# Patient Record
Sex: Female | Born: 1970 | Race: White | Hispanic: No | Marital: Married | Smoking: Current every day smoker
Health system: Southern US, Community
[De-identification: ages and names within clinical notes are randomized; demographics above are authoritative.]

## PROBLEM LIST (undated history)

## (undated) DIAGNOSIS — F32A Depression, unspecified: Secondary | ICD-10-CM

## (undated) DIAGNOSIS — F329 Major depressive disorder, single episode, unspecified: Secondary | ICD-10-CM

## (undated) DIAGNOSIS — Z915 Personal history of self-harm: Secondary | ICD-10-CM

## (undated) DIAGNOSIS — J449 Chronic obstructive pulmonary disease, unspecified: Secondary | ICD-10-CM

## (undated) DIAGNOSIS — J342 Deviated nasal septum: Secondary | ICD-10-CM

## (undated) DIAGNOSIS — E785 Hyperlipidemia, unspecified: Secondary | ICD-10-CM

## (undated) DIAGNOSIS — R51 Headache: Secondary | ICD-10-CM

## (undated) DIAGNOSIS — F419 Anxiety disorder, unspecified: Secondary | ICD-10-CM

## (undated) DIAGNOSIS — I499 Cardiac arrhythmia, unspecified: Secondary | ICD-10-CM

## (undated) DIAGNOSIS — Z720 Tobacco use: Secondary | ICD-10-CM

## (undated) DIAGNOSIS — Z8669 Personal history of other diseases of the nervous system and sense organs: Secondary | ICD-10-CM

## (undated) HISTORY — PX: SHOULDER SURGERY: SHX246

## (undated) HISTORY — PX: BREAST ENHANCEMENT SURGERY: SHX7

## (undated) HISTORY — DX: Headache: R51

## (undated) HISTORY — DX: Personal history of self-harm: Z91.5

## (undated) HISTORY — PX: ABDOMINAL HYSTERECTOMY: SHX81

## (undated) HISTORY — PX: AUGMENTATION MAMMAPLASTY: SUR837

## (undated) HISTORY — DX: Anxiety disorder, unspecified: F41.9

## (undated) HISTORY — DX: Major depressive disorder, single episode, unspecified: F32.9

## (undated) HISTORY — DX: Chronic obstructive pulmonary disease, unspecified: J44.9

## (undated) HISTORY — DX: Tobacco use: Z72.0

## (undated) HISTORY — DX: Personal history of other diseases of the nervous system and sense organs: Z86.69

## (undated) HISTORY — DX: Depression, unspecified: F32.A

## (undated) HISTORY — DX: Deviated nasal septum: J34.2

## (undated) HISTORY — PX: TRANSUMBILICAL AUGMENTATION MAMMAPLASTY: SUR838

## (undated) HISTORY — DX: Cardiac arrhythmia, unspecified: I49.9

---

## 2008-04-16 DIAGNOSIS — Z9151 Personal history of suicidal behavior: Secondary | ICD-10-CM

## 2008-04-16 HISTORY — DX: Personal history of suicidal behavior: Z91.51

## 2016-11-14 ENCOUNTER — Ambulatory Visit
Admission: RE | Admit: 2016-11-14 | Discharge: 2016-11-14 | Disposition: A | Discharge status: Home / Self Care | Payer: Commercial Managed Care - PPO | Source: Ambulatory Visit | Attending: Orthopedic Surgery | Admitting: Orthopedic Surgery

## 2016-11-14 ENCOUNTER — Other Ambulatory Visit: Payer: Self-pay | Admitting: Orthopedic Surgery

## 2016-11-14 DIAGNOSIS — M79671 Pain in right foot: Secondary | ICD-10-CM

## 2017-05-01 ENCOUNTER — Other Ambulatory Visit: Payer: Self-pay | Admitting: Family Medicine

## 2017-05-01 ENCOUNTER — Ambulatory Visit
Admission: RE | Admit: 2017-05-01 | Discharge: 2017-05-01 | Disposition: A | Discharge status: Home / Self Care | Payer: PRIVATE HEALTH INSURANCE | Source: Ambulatory Visit | Attending: Family Medicine | Admitting: Family Medicine

## 2017-05-01 DIAGNOSIS — J40 Bronchitis, not specified as acute or chronic: Secondary | ICD-10-CM

## 2017-05-16 ENCOUNTER — Other Ambulatory Visit: Payer: Self-pay

## 2017-05-16 ENCOUNTER — Emergency Department
Admission: EM | Admit: 2017-05-16 | Discharge: 2017-05-16 | Disposition: A | Discharge status: Home / Self Care | Payer: PRIVATE HEALTH INSURANCE | Attending: Emergency Medicine | Admitting: Emergency Medicine

## 2017-05-16 ENCOUNTER — Emergency Department: Payer: PRIVATE HEALTH INSURANCE

## 2017-05-16 DIAGNOSIS — F419 Anxiety disorder, unspecified: Secondary | ICD-10-CM | POA: Insufficient documentation

## 2017-05-16 DIAGNOSIS — F17210 Nicotine dependence, cigarettes, uncomplicated: Secondary | ICD-10-CM | POA: Insufficient documentation

## 2017-05-16 DIAGNOSIS — R079 Chest pain, unspecified: Secondary | ICD-10-CM | POA: Insufficient documentation

## 2017-05-16 HISTORY — DX: Hyperlipidemia, unspecified: E78.5

## 2017-05-16 LAB — BASIC METABOLIC PANEL
Anion gap: 10 (ref 5–15)
BUN: 12 mg/dL (ref 6–20)
CHLORIDE: 105 mmol/L (ref 101–111)
CO2: 26 mmol/L (ref 22–32)
CREATININE: 0.84 mg/dL (ref 0.44–1.00)
Calcium: 10.7 mg/dL — ABNORMAL HIGH (ref 8.9–10.3)
Glucose, Bld: 102 mg/dL — ABNORMAL HIGH (ref 65–99)
Potassium: 3.7 mmol/L (ref 3.5–5.1)
SODIUM: 141 mmol/L (ref 135–145)

## 2017-05-16 LAB — CBC
HCT: 43.6 % (ref 35.0–47.0)
Hemoglobin: 14.6 g/dL (ref 12.0–16.0)
MCH: 30.9 pg (ref 26.0–34.0)
MCHC: 33.4 g/dL (ref 32.0–36.0)
MCV: 92.5 fL (ref 80.0–100.0)
PLATELETS: 233 10*3/uL (ref 150–440)
RBC: 4.72 MIL/uL (ref 3.80–5.20)
RDW: 13.3 % (ref 11.5–14.5)
WBC: 11.5 10*3/uL — AB (ref 3.6–11.0)

## 2017-05-16 LAB — TROPONIN I

## 2017-05-16 MED ORDER — ASPIRIN 81 MG PO CHEW
324.0000 mg | CHEWABLE_TABLET | Freq: Once | ORAL | Status: AC
  Administered 2017-05-16: 324 mg via ORAL
  Filled 2017-05-16: qty 4

## 2017-05-16 MED ORDER — LORAZEPAM 1 MG PO TABS
1.0000 mg | ORAL_TABLET | Freq: Once | ORAL | Status: AC
  Administered 2017-05-16: 1 mg via ORAL
  Filled 2017-05-16: qty 1

## 2017-05-16 NOTE — ED Notes (Signed)
E-signature pad not functioning at time of pt d/c; paper copy signed by pt for scanning into EMR.

## 2017-05-16 NOTE — Discharge Instructions (Signed)
Please return to the emergency department if you develop severe pain, shortness of breath, lightheadedness or fainting, fever, or any other symptoms concerning to you. °

## 2017-05-16 NOTE — ED Provider Notes (Addendum)
[facility] Emergency Department Provider Note  ____________________________________________  Time seen: Approximately 8:16 PM  I have reviewed the triage vital signs and the nursing notes.   HISTORY  Chief Complaint Chest Pain    HPI Nancy Porter is a 47 y.o. female with ongoing tobacco abuse presenting for chest pain.  The patient reports that she was at work as a IT consultantparalegal, sitting down, when she developed the sensation of chest pain described as "someone squeezing both sides of my chest.  The pain radiated into her right shoulder neck and jaw; she did not have a pleuritic component.  She did not have any associated diaphoresis, nausea or vomiting, palpitations, lightheadedness or syncope, shortness of breath.  At home, she tried Tums, but her pain persisted.  The patient also reports that she has been under "a lot of stress with lots of anxiety," and that her PCP started her on Wellbutrin 4 days ago.  In addition, she "just got over a bout of bronchitis."  She has never been on medications for hypertension, hyperlipidemia, diabetes; she denies the use of cocaine.  She has no personal or family history of blood clots.  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 Hyperlipidemia     There are no active problems to display for this patient.   Past Surgical History:  Procedure Laterality Date  . BREAST ENHANCEMENT SURGERY    . CESAREAN SECTION        Allergies Patient has no known allergies.  No family history on file.  Social History Social History   Tobacco Use  . Smoking status: Current Every Day Smoker    Packs/day: 1.00    Types: Cigarettes  . Smokeless tobacco: Never Used  Substance Use Topics  . Alcohol use: Yes    Comment: occ  . Drug use: No    Review of Systems Constitutional: No  fever/chills.  No lightheadedness or syncope. Eyes: No visual changes. ENT: No sore throat. No congestion or rhinorrhea. Cardiovascular: Positive chest pain. Denies palpitations. Respiratory: Denies shortness of breath.  No cough.  Recent bronchitis, now resolved. Gastrointestinal: No abdominal pain.  No nausea, no vomiting.  No diarrhea.  No constipation. Genitourinary: Negative for dysuria. Musculoskeletal: Negative for back pain.  No lower extremity swelling or calf pain. Skin: Negative for rash. Neurological: Negative for headaches. No focal numbness, tingling or weakness.  Psychiatric:Positive anxiety.  Recently started on Wellbutrin.  ____________________________________________   PHYSICAL EXAM:  VITAL SIGNS: ED Triage Vitals  Enc Vitals Group     BP 05/16/17 1945 123/62     Pulse Rate 05/16/17 1945 77     Resp 05/16/17 1945 16     Temp 05/16/17 1945 98.3 F (36.8 C)     Temp Source 05/16/17 1945 Oral     SpO2 05/16/17 1945 98 %     Weight 05/16/17 1942 170 lb (77.1 kg)     Height 05/16/17 1942 5\' 7"  (1.702 m)     Head Circumference --      Peak Flow --      Pain Score 05/16/17 1942 8     Pain Loc --      Pain Edu? --      Excl. in GC? --     Constitutional: Alert and oriented. Well appearing and in no acute distress. Answers questions appropriately. Eyes: Conjunctivae  are normal.  EOMI. No scleral icterus. Head: Atraumatic. Nose: No congestion/rhinnorhea. Mouth/Throat: Mucous membranes are moist.  Neck: No stridor.  Supple.  JVD.  No meningismus. Cardiovascular: Normal rate, regular rhythm. No murmurs, rubs or gallops.  Respiratory: Normal respiratory effort.  No accessory muscle use or retractions. Lungs CTAB.  No wheezes, rales or ronchi. Gastrointestinal: Soft, nontender and nondistended.  No guarding or rebound.  No peritoneal signs. Musculoskeletal: No LE edema. No ttp in the calves or palpable cords.  Negative Homan's sign. Neurologic:  A&Ox3.  Speech is  clear.  Face and smile are symmetric.  EOMI.  Moves all extremities well. Skin:  Skin is warm, dry and intact. No rash noted. Psychiatric: Mood and affect are normal. Speech and behavior are normal.  Normal judgement.  ____________________________________________   LABS (all labs ordered are listed, but only abnormal results are displayed)  Labs Reviewed  BASIC METABOLIC PANEL - Abnormal; Notable for the following components:      Result Value   Glucose, Bld 102 (*)    Calcium 10.7 (*)    All other components within normal limits  CBC - Abnormal; Notable for the following components:   WBC 11.5 (*)    All other components within normal limits  TROPONIN I  TROPONIN I  POC URINE PREG, ED   ____________________________________________  EKG  ED ECG REPORT I, Rockne Menghini, the attending physician, personally viewed and interpreted this ECG.   Date: 05/16/2017  EKG Time: 1942  Rate: 72  Rhythm: normal sinus rhythm  Axis: normal  Intervals:none  ST&T Change: No STEMI  ____________________________________________  RADIOLOGY  Dg Chest 2 View  Result Date: 05/16/2017 CLINICAL DATA:  Chest pain. EXAM: CHEST  2 VIEW COMPARISON:  May 01, 2017 FINDINGS: The heart size and mediastinal contours are within normal limits. Both lungs are clear. The visualized skeletal structures are unremarkable. IMPRESSION: No active cardiopulmonary disease. Electronically Signed   By: Gerome Sam III M.D   On: 05/16/2017 20:13    ____________________________________________   PROCEDURES  Procedure(s) performed: None  Procedures  Critical Care performed: No ____________________________________________   INITIAL IMPRESSION / ASSESSMENT AND PLAN / ED COURSE  Pertinent labs & imaging results that were available during my care of the patient were reviewed by me and considered in my medical decision making (see chart for details).  47 y.o. female who is only personal cardiac  risk factors ongoing tobacco abuse, presenting with bilateral chest pain.  Overall, the patient is hemodynamically stable.  Her EKG does not show any ischemic changes and her first troponin is negative.  It is unlikely that the patient has unstable angina or MI today, but I will treat her with aspirin as we are awaiting her second troponin.  Given her smoking history and age, it is reasonable for the patient to follow-up as an outpatient with a cardiologist for outpatient stress test/risk stratification study.  It is possible that the patient's discomfort is due to anxiety, so I have ordered Ativan.  A GI cause such as reflux or peptic ulcer disease is also possible although the patient did not respond to Tums.  Plan reevaluation for final disposition.   ----------------------------------------- 10:25 PM on 05/16/2017 -----------------------------------------  I have reviewed the patient's medical chart.  The patient continues to be hemodynamically stable and asymptomatic.  Her second troponin is negative and she is feeling much better at this time.  I will plan to discharge her home with PMD as well as cardiology follow-up.  She understands return precautions as well as follow-up instructions. ____________________________________________  FINAL CLINICAL IMPRESSION(S) / ED DIAGNOSES  Final diagnoses:  Chest pain, unspecified type  Anxiety         NEW MEDICATIONS STARTED DURING THIS VISIT:  New Prescriptions   No medications on file      Rockne Menghini, MD 05/16/17 2022    Rockne Menghini, MD 05/16/17 2226

## 2017-05-16 NOTE — ED Triage Notes (Signed)
Pt c/o chest pain that started this am - this afternoon the pain persisted and she feels like someone is clenching her heart - the pain radiates into right shoulder/neck/jaw - pt denies N/V - denies dizziness - reports headache

## 2017-11-18 ENCOUNTER — Ambulatory Visit (INDEPENDENT_AMBULATORY_CARE_PROVIDER_SITE_OTHER): Payer: PRIVATE HEALTH INSURANCE | Admitting: Neurology

## 2017-11-18 ENCOUNTER — Encounter: Payer: Self-pay | Admitting: Neurology

## 2017-11-18 VITALS — BP 114/72 | HR 68 | Ht 67.0 in | Wt 166.0 lb

## 2017-11-18 DIAGNOSIS — G43019 Migraine without aura, intractable, without status migrainosus: Secondary | ICD-10-CM | POA: Diagnosis not present

## 2017-11-18 DIAGNOSIS — F17200 Nicotine dependence, unspecified, uncomplicated: Secondary | ICD-10-CM | POA: Diagnosis not present

## 2017-11-18 MED ORDER — PREDNISONE 10 MG PO TABS: ORAL_TABLET | ORAL | 0 refills | Status: DC

## 2017-11-18 MED ORDER — TOPIRAMATE 50 MG PO TABS: 50.0000 mg | ORAL_TABLET | Freq: Every day | ORAL | 2 refills | Status: DC

## 2017-11-18 MED ORDER — KETOROLAC TROMETHAMINE 60 MG/2ML IM SOLN
60.0000 mg | Freq: Once | INTRAMUSCULAR | Status: AC
  Administered 2017-11-18: 60 mg via INTRAMUSCULAR

## 2017-11-18 MED ORDER — METOCLOPRAMIDE HCL 5 MG/ML IJ SOLN
10.0000 mg | Freq: Once | INTRAVENOUS | Status: AC
  Administered 2017-11-18: 10 mg via INTRAMUSCULAR

## 2017-11-18 MED ORDER — DIPHENHYDRAMINE HCL 50 MG/ML IJ SOLN
25.0000 mg | Freq: Once | INTRAMUSCULAR | Status: AC
Start: 2017-11-18 — End: 2017-11-18
  Administered 2017-11-18: 25 mg via INTRAMUSCULAR

## 2017-11-18 NOTE — Addendum Note (Signed)
Addended by: Dorthy CoolerBURNS, SANDRA J on: 11/18/2017 11:31 AM   Modules accepted: Orders

## 2017-11-18 NOTE — Patient Instructions (Signed)
Migraine Recommendations: 1.  Start topiramate 50mg  at bedtime.  Call in 4 weeks with update and we can adjust dose if needed. 2.  Take sumatriptan 100mg  at earliest onset of headache.  May repeat dose once in 2 hours if needed.  Do not exceed two tablets in 24 hours. 3.  STOP FIORICET, EXCEDRIN, TYLENOL and IBUPROFEN.  Limit use of pain relievers to no more than 2 days out of the week.  These medications include acetaminophen, ibuprofen, triptans and narcotics.  This will help reduce risk of rebound headaches. 4.  Be aware of common food triggers such as processed sweets, processed foods with nitrites (such as deli meat, hot dogs, sausages), foods with MSG, alcohol (such as wine), chocolate, certain cheeses, certain fruits (dried fruits, bananas, some citrus fruit), vinegar, diet soda. 4.  Avoid caffeine 5.  Routine exercise 6.  Proper sleep hygiene 7.  Stay adequately hydrated with water 8.  Keep a headache diary. 9.  Maintain proper stress management. 10.  Do not skip meals. 11.  Consider supplements:  Magnesium citrate 400mg  to 600mg  daily, riboflavin 400mg , Coenzyme Q 10 100mg  three times daily 12.  We will give you a headache cocktail here in the office.  If headache persists tomorrow, fill the prednisone taper:  Take 6tabs x1day, then 5tabs x1day, then 4tabs x1day, then 3tabs x1day, then 2tabs x1day, then 1tab x1day, then STOP 13.  Follow up in 4 months but we can increase topiramate in 4 weeks if needed (contact me)

## 2017-11-18 NOTE — Progress Notes (Signed)
NEUROLOGY CONSULTATION NOTE  Nancy Porter MRN: [id number] DOB: Jun 27, 1970  Referring provider: Dr. Docia Chuck Primary care provider: Dr. Docia Chuck  Reason for consult:  headache  HISTORY OF PRESENT ILLNESS: Nancy Porter is a 47 year old right-handed female with anxiety, tobacco use who presents for headache.    Onset:  She had had migraines for several years.  Typically they occur once a week, last a day and respond to Excedrin Migraine or Tylenol.  She has had a near constant headache since 11/09/17.   Location:  Right retro-orbital/temporal but may switch to the left, bilateral temporal and periorbital Quality:  throbbing Intensity:  severe.  She denies thunderclap headache or severe headache that wakes her from sleep. Aura:  no Prodrome:  no Postdrome:  no Associated symptoms:  Sometimes nausea and vomiting.  Photophobia, phonophobia, foggy vision in right eye, numbing sensation on scalp.  She denies associated unilateral numbness or weakness. Duration:  Persistent.  She wakes up in morning without headache but headache starts within an hour of getting up. Frequency:  Daily since 11/09/17. Frequency of abortive medication: daily Triggers:  no Exacerbating factors:  no Relieving factors:  no Activity:  Aggravates  Rescue protocol:  1st line:  Tylenol; 2nd line:  Tylenol in 45 minutes; 3rd line: sumatriptan 50mg  Current NSAIDS:  no Current analgesics:  no Current triptans:  no Current ergotamine:  no Current anti-emetic:  no Current muscle relaxants:  no Current anti-anxiolytic:  no Current sleep aide:  no Current Antihypertensive medications:  no Current Antidepressant medications:  no Current Anticonvulsant medications:  no Current anti-CGRP:  no Current Vitamins/Herbal/Supplements:  no Current Antihistamines/Decongestants:  no Other therapy:  no Other medication:  no  Past NSAIDS:  Ibuprofen 800mg  (ineffective) Past analgesics:  Tylenol (ineffective), Excedrin  Migraine (ineffective) Past abortive triptans:  Sumatriptan 50mg  (ineffective) Past abortive ergotamine:  no Past muscle relaxants:  no Past anti-emetic:  no Past antihypertensive medications:  no Past antidepressant medications:  no Past anticonvulsant medications:  no Past anti-CGRP:  no Past vitamins/Herbal/Supplements:  no Past antihistamines/decongestants:  no Other past therapies:  no  Caffeine:  3 cups of coffee daily Alcohol:  no Smoker:  1 ppd Diet:  8 glasses of water daily Exercise:  no Depression:  yes; Anxiety:  yes Other pain:  no Sleep hygiene:  good Family history of headache:  Sister.  05/16/17 BMP:  Na 141, K 3.7, glucose 102, BUN 12, Cr 0.84  PAST MEDICAL HISTORY: Past Medical History:  Diagnosis Date  . Hyperlipidemia     PAST SURGICAL HISTORY: Past Surgical History:  Procedure Laterality Date  . BREAST ENHANCEMENT SURGERY    . CESAREAN SECTION      MEDICATIONS: Current Outpatient Medications on File Prior to Visit  Medication Sig Dispense Refill  . butalbital-acetaminophen-caffeine (FIORICET, ESGIC) 50-325-40 MG tablet Take 1 tablet by mouth every 6 (six) hours as needed for headache.     No current facility-administered medications on file prior to visit.     ALLERGIES: No Known Allergies  FAMILY HISTORY: Family History  Problem Relation Age of Onset  . Glaucoma Mother   . Heart disease Mother   . Deep vein thrombosis Father   . Migraines Sister     SOCIAL HISTORY: Social History   Socioeconomic History  . Marital status: Married    Spouse name: Renae Fickle  . Number of children: 2  . Years of education: Not on file  . Highest education level: Some college, no degree  Occupational History  .  Occupation: Scientist, physiological: [employer]  Social Needs  . Financial resource strain: Not on file  . Food insecurity:    Worry: Not on file    Inability: Not on file  . Transportation needs:    Medical: Not on file     Non-medical: Not on file  Tobacco Use  . Smoking status: Current Every Day Smoker    Packs/day: 1.00    Types: Cigarettes  . Smokeless tobacco: Never Used  Substance and Sexual Activity  . Alcohol use: Yes    Comment: occ  . Drug use: No  . Sexual activity: Not on file  Lifestyle  . Physical activity:    Days per week: Not on file    Minutes per session: Not on file  . Stress: Not on file  Relationships  . Social connections:    Talks on phone: Not on file    Gets together: Not on file    Attends religious service: Not on file    Active member of club or organization: Not on file    Attends meetings of clubs or organizations: Not on file    Relationship status: Not on file  . Intimate partner violence:    Fear of current or ex partner: Not on file    Emotionally abused: Not on file    Physically abused: Not on file    Forced sexual activity: Not on file  Other Topics Concern  . Not on file  Social History Narrative   Patient is left-handed. She lives with her husband and daughter in a 1 story house. Her daughter leaves for [employer] next week. She has not been exercising in the last year.    REVIEW OF SYSTEMS: Constitutional: No fevers, chills, or sweats, no generalized fatigue, change in appetite Eyes: No visual changes, double vision, eye pain Ear, nose and throat: No hearing loss, ear pain, nasal congestion, sore throat Cardiovascular: No chest pain, palpitations Respiratory:  No shortness of breath at rest or with exertion, wheezes GastrointestinaI: No nausea, vomiting, diarrhea, abdominal pain, fecal incontinence Genitourinary:  No dysuria, urinary retention or frequency Musculoskeletal:  No neck pain, back pain Integumentary: No rash, pruritus, skin lesions Neurological: as above Psychiatric: No depression, insomnia, anxiety Endocrine: No palpitations, fatigue, diaphoresis, mood swings, change in appetite, change in weight, increased thirst Hematologic/Lymphatic:   No purpura, petechiae. Allergic/Immunologic: no itchy/runny eyes, nasal congestion, recent allergic reactions, rashes  PHYSICAL EXAM: Vitals:   11/18/17 0913  BP: 114/72  Pulse: 68  SpO2: 97%   General: No acute distress.  Patient appears well-groomed.  Head:  Normocephalic/atraumatic Eyes:  fundi examined but not visualized Neck: supple, no paraspinal tenderness, full range of motion Back: No paraspinal tenderness Heart: regular rate and rhythm Lungs: Clear to auscultation bilaterally. Vascular: No carotid bruits. Neurological Exam: Mental status: alert and oriented to person, place, and time, recent and remote memory intact, fund of knowledge intact, attention and concentration intact, speech fluent and not dysarthric, language intact. Cranial nerves: CN I: not tested CN II: pupils equal, round and reactive to light, visual fields intact CN III, IV, VI:  full range of motion, no nystagmus, no ptosis CN V: facial sensation intact CN VII: upper and lower face symmetric CN VIII: hearing intact CN IX, X: gag intact, uvula midline CN XI: sternocleidomastoid and trapezius muscles intact CN XII: tongue midline Bulk & Tone: normal, no fasciculations. Motor:  5/5 throughout  Sensation: temperature and vibration sensation intact. Deep Tendon  Reflexes:  2+ throughout, toes downgoing.  Finger to nose testing:  Without dysmetria.  Heel to shin:  Without dysmetria.  Gait:  Normal station and stride.  Able to turn and tandem walk. Romberg negative.  IMPRESSION: Intractable migraine without aura Tobacco use disorder  PLAN: 1.  To break migraine, will administer headache cocktail (Toradol 60mg Ceasar Mons/Reglan 10mg /Benadryl 25mg ).  She will have her daughter pick her up. If headache persists tomorrow, she will take prednisone taper. 2.  Topiramate 50mg  at bedtime as preventative.  If headache abortive, she may hold off on taking it. 3.  Advised to take sumatriptan 100mg  at earliest onset (first  line).  May repeat in 2 hours if needed, not to exceed 2 tablets in 24 hours.  She will contact us if ineffective. 4.  Stop Fioricet.  Limit use of pain relievers to no more than 2 days out of week to prevent rebound headache 5.  Headache diary 6.  Hydration, exercise 7.  Consider Mg, B2, CoQ10 8.  Tobacco cessation counseling (CPT 99406):  Tobacco with no history of CAD, stroke, or cancer  - Currently smoking 1 packs/day   - Patient was informed of the dangers of tobacco abuse including stroke, cancer, and MI, as well as benefits of tobacco cessation. - Patient is willing to quit at this time. - Approximately 4 mins were spent counseling patient cessation techniques. We discussed various methods to help quit smoking, including deciding on a date to quit, joining a support group, pharmacological agents- nicotine gum/patch/lozenges, chantix.  - I will reassess her progress at the next follow-up visit  9.  Follow up in 4 months  Thank you for allowing me to take part in the care of this patient.  Shon MilletAdam Nezzie Manera, DO  CC:  Darrow Bussingibas Koirala, MD

## 2017-11-19 ENCOUNTER — Other Ambulatory Visit: Payer: Self-pay

## 2017-11-19 ENCOUNTER — Emergency Department
Admission: EM | Admit: 2017-11-19 | Discharge: 2017-11-19 | Disposition: A | Discharge status: Home / Self Care | Payer: PRIVATE HEALTH INSURANCE | Attending: Emergency Medicine | Admitting: Emergency Medicine

## 2017-11-19 ENCOUNTER — Telehealth: Payer: Self-pay | Admitting: Neurology

## 2017-11-19 DIAGNOSIS — G43009 Migraine without aura, not intractable, without status migrainosus: Secondary | ICD-10-CM | POA: Diagnosis not present

## 2017-11-19 DIAGNOSIS — F17210 Nicotine dependence, cigarettes, uncomplicated: Secondary | ICD-10-CM | POA: Diagnosis not present

## 2017-11-19 DIAGNOSIS — Z79899 Other long term (current) drug therapy: Secondary | ICD-10-CM | POA: Diagnosis not present

## 2017-11-19 DIAGNOSIS — R51 Headache: Secondary | ICD-10-CM | POA: Diagnosis present

## 2017-11-19 MED ORDER — SODIUM CHLORIDE 0.9 % IV BOLUS
1000.0000 mL | Freq: Once | INTRAVENOUS | Status: AC
  Administered 2017-11-19: 1000 mL via INTRAVENOUS

## 2017-11-19 MED ORDER — DIPHENHYDRAMINE HCL 50 MG/ML IJ SOLN
INTRAMUSCULAR | Status: AC
  Administered 2017-11-19: 12.5 mg via INTRAVENOUS
  Filled 2017-11-19: qty 1

## 2017-11-19 MED ORDER — PROCHLORPERAZINE EDISYLATE 10 MG/2ML IJ SOLN
10.0000 mg | Freq: Once | INTRAMUSCULAR | Status: AC
  Administered 2017-11-19: 10 mg via INTRAVENOUS

## 2017-11-19 MED ORDER — DIPHENHYDRAMINE HCL 50 MG/ML IJ SOLN
12.5000 mg | Freq: Once | INTRAMUSCULAR | Status: AC
  Administered 2017-11-19: 12.5 mg via INTRAVENOUS

## 2017-11-19 MED ORDER — KETOROLAC TROMETHAMINE 30 MG/ML IJ SOLN
15.0000 mg | Freq: Once | INTRAMUSCULAR | Status: AC
  Administered 2017-11-19: 15 mg via INTRAVENOUS

## 2017-11-19 MED ORDER — KETOROLAC TROMETHAMINE 30 MG/ML IJ SOLN
INTRAMUSCULAR | Status: AC
  Administered 2017-11-19: 15 mg via INTRAVENOUS
  Filled 2017-11-19: qty 1

## 2017-11-19 MED ORDER — PROCHLORPERAZINE EDISYLATE 10 MG/2ML IJ SOLN
INTRAMUSCULAR | Status: AC
  Administered 2017-11-19: 10 mg via INTRAVENOUS
  Filled 2017-11-19: qty 2

## 2017-11-19 NOTE — Telephone Encounter (Signed)
Spoke with Pt. She was confused of how to take her medications. I asked her to get the AVS we gave her yesterday with the instructions. I went over them with her. She said it was discussed yesterday, if her headache was still severe, she could go to the ER for IV therapy. Pt may decide to proceed to the ER.

## 2017-11-19 NOTE — Telephone Encounter (Signed)
Patient got a shot yesterday and RX for topamax and she still has the headache. She states that she is not sure what do. Please call patient

## 2017-11-19 NOTE — Discharge Instructions (Addendum)
Return to the emergency room for any new or worrisome symptoms, take the medications from your neurologist as prescribed.  Return to the emergency room for any other concerns any sudden onset headache worst headache of life, numbness weakness tingling change in vision change in hearing or other concerns.  May wish to have an outpatient MRI done by your primary care doctor although that is not indicated for this time in  the ER, in the future it may be of utility.

## 2017-11-19 NOTE — ED Notes (Signed)
AAOx3.  Skin warm and dry.  NAD 

## 2017-11-19 NOTE — ED Triage Notes (Signed)
Migraine x 10 days. Has seen PCP and neurologist. Pt states that she had medications yesterday at neurologist that helped, but pain is back.

## 2017-11-19 NOTE — ED Provider Notes (Addendum)
[facility] Emergency Department Provider Note  ____________________________________________   I have reviewed the triage vital signs and the nursing notes. Where available I have reviewed prior notes and, if possible and indicated, outside hospital notes.    HISTORY  Chief Complaint Migraine    HPI Nancy Porter is a 47 y.o. female with a history of migraine headaches for 20 years she states presents today complaining of headache.  Patient has had a headache now for 10 days.  She has had headaches once a week for 20 years she states.  She usually handles with Excedrin Migraine.  Over the last couple months have been getting more significant.  She is followed by a neurologist as an outpatient.  She yesterday saw Dr. Everlena CooperJaffe, who evaluated her headaches as a neurologist.  He administered a migraine cocktail, she states her headache got better so and then came back this morning.  He also prescribed her topiramate, which she states she took.  The neurologist diagnosed her yesterday with migraine, intractable.  She denies any focal numbness or weakness, she is not vomiting, she denies any change in her chronic headaches.  Except for the duration.  Somewhat better when she rests is worse when she walks around, and no other alleviating or aggravating symptoms.  Medications seem to help yesterday but is back. .  Patient states she has had no fevers no stiff neck.  Past Medical History:  Diagnosis Date  . Hyperlipidemia     There are no active problems to display for this patient.   Past Surgical History:  Procedure Laterality Date  . BREAST ENHANCEMENT SURGERY    . CESAREAN SECTION      Prior to Admission medications   Medication Sig Start Date End Date Taking? Authorizing Provider  butalbital-acetaminophen-caffeine (FIORICET, ESGIC) 50-325-40 MG tablet Take 1 tablet by mouth every 6 (six) hours as needed for headache.    [provider]  predniSONE  (DELTASONE) 10 MG tablet Take 6tabs x1day, then 5tabs x1day, then 4tabs x1day, then 3tabs x1day, then 2tabs x1day, then 1tab x1day, then STOP 11/18/17   Jaffe, Adam R, DO  topiramate (TOPAMAX) 50 MG tablet Take 1 tablet (50 mg total) by mouth at bedtime. 11/18/17   Drema DallasJaffe, Adam R, DO    Allergies Patient has no known allergies.  Family History  Problem Relation Age of Onset  . Glaucoma Mother   . Heart disease Mother   . Deep vein thrombosis Father   . Migraines Sister     Social History Social History   Tobacco Use  . Smoking status: Current Every Day Smoker    Packs/day: 1.00    Types: Cigarettes  . Smokeless tobacco: Never Used  Substance Use Topics  . Alcohol use: Yes    Comment: occ  . Drug use: No    Review of Systems Constitutional: No fever/chills Eyes: No visual changes. ENT: No sore throat. No stiff neck no neck pain Cardiovascular: Denies chest pain. Respiratory: Denies shortness of breath. Gastrointestinal:   no vomiting.  No diarrhea.  No constipation. Genitourinary: Negative for dysuria. Musculoskeletal: Negative lower extremity swelling Skin: Negative for rash. Neurological: Negative for severe headaches, focal weakness or numbness.   ____________________________________________   PHYSICAL EXAM:  VITAL SIGNS: ED Triage Vitals  Enc Vitals Group     BP 11/19/17 1637 121/77     Pulse Rate 11/19/17 1637 76     Resp 11/19/17 1637 18     Temp 11/19/17 1637 98.4 F (36.9  C)     Temp Source 11/19/17 1637 Oral     SpO2 11/19/17 1637 100 %     Weight 11/19/17 1638 166 lb (75.3 kg)     Height 11/19/17 1638 5\' 7"  (1.702 m)     Head Circumference --      Peak Flow --      Pain Score 11/19/17 1649 10     Pain Loc --      Pain Edu? --      Excl. in GC? --     Constitutional: Alert and oriented. Well appearing and in no acute distress.  Speaking in a whisper, has something over her eyes but no acute medical distress noted Eyes: Conjunctivae are  normal Head: Atraumatic HEENT: No congestion/rhinnorhea. Mucous membranes are moist.  Oropharynx non-erythematous Neck:   Nontender with no meningismus, no masses, no stridor Cardiovascular: Normal rate, regular rhythm. Grossly normal heart sounds.  Good peripheral circulation. Respiratory: Normal respiratory effort.  No retractions. Lungs CTAB. Abdominal: Soft and nontender. No distention. No guarding no rebound Back:  There is no focal tenderness or step off.  there is no midline tenderness there are no lesions noted. there is no CVA tenderness Musculoskeletal: No lower extremity tenderness, no upper extremity tenderness. No joint effusions, no DVT signs strong distal pulses no edema Neurologic: Cranial nerves II through XII are grossly intact 5 out of 5 strength bilateral upper and lower extremity. Finger to nose within normal limits heel to shin within normal limits, speech is normal with no word finding difficulty or dysarthria, reflexes symmetric, pupils are equally round and reactive to light, there is no pronator drift, sensation is normal, vision is intact to confrontation, gait is deferred, there is no nystagmus, normal neurologic exam Skin:  Skin is warm, dry and intact. No rash noted. Psychiatric: Mood and affect are normal. Speech and behavior are normal.  ____________________________________________   LABS (all labs ordered are listed, but only abnormal results are displayed)  Labs Reviewed - No data to display  Pertinent labs  results that were available during my care of the patient were reviewed by me and considered in my medical decision making (see chart for details). ____________________________________________  EKG  I personally interpreted any EKGs ordered by me or triage  ____________________________________________  RADIOLOGY  Pertinent labs & imaging results that were available during my care of the patient were reviewed by me and considered in my medical  decision making (see chart for details). If possible, patient and/or family made aware of any abnormal findings.  No results found. ____________________________________________    PROCEDURES  Procedure(s) performed: None  Procedures  Critical Care performed: None  ____________________________________________   INITIAL IMPRESSION / ASSESSMENT AND PLAN / ED COURSE  Pertinent labs & imaging results that were available during my care of the patient were reviewed by me and considered in my medical decision making (see chart for details).  She is with nearly 30 years of headaches, nearly every week, presents with her usual headache, which is lasting longer than normal.  Patient was evaluated yesterday by a neurologist, and diagnosed with intractable migraines, and her medications were changed including that was removed it appears.  We will give her migraine medication she is neurologically intact low suspicion for acute processes going to require acute intervention today such as aneurysm etc.  We will see if we get her feeling better.  Patient has been having this exact same headache for nearly 30 years.  ----------------------------------------- 5:50 PM on  11/19/2017 -----------------------------------------  Headache free, Cranial nerves II through XII are grossly intact 5 out of 5 strength bilateral upper and lower extremity. Finger to nose within normal limits heel to shin within normal limits, speech is normal with no word finding difficulty or dysarthria, reflexes symmetric, pupils are equally round and reactive to light, there is no pronator drift, sensation is normal, vision is intact to confrontation, gait is normal, there is no nystagmus, normal neurologic exam Requesting discharge Return precautions follow-up given and understood.   ____________________________________________   FINAL CLINICAL IMPRESSION(S) / ED DIAGNOSES  Final diagnoses:  None      This chart was  dictated using voice recognition software.  Despite best efforts to proofread,  errors can occur which can change meaning.      Jeanmarie Plant, MD 11/19/17 1707    Jeanmarie Plant, MD 11/19/17 1750

## 2017-11-20 ENCOUNTER — Telehealth: Payer: Self-pay

## 2017-11-20 NOTE — Telephone Encounter (Signed)
Pt called, very upset. She went to the ER yesterday, was given headache cocktail. She still has a severe headache. I asked had she taken the prednisone as instructed. She was still confused about how to take the prednisone, she had taken 2 this morning. I reminded her of our conversation yesterday, and that she was to take all 5 today, as it is instructed on the AVS she was given and read back to me yesterday. She is confused why we are not doing any scans on her head to find the cause of her headaches. I advised the Pt we are trying to break the headache she currently has, she states she has had headaches for 20 years and never had one like this. I advised the Pt, if it is unbearable, she should go to the ED and ask for IV treatment. I advised her though we know she is struggling and we understand that, she will need to take the medication as instructed, rest as much as possible and to keep hydrated.

## 2017-11-20 NOTE — Telephone Encounter (Signed)
Called and LMOVM for Pt to return my call 

## 2017-11-20 NOTE — Telephone Encounter (Signed)
Order MRI of brain without contrast for worsening headache

## 2017-11-21 NOTE — Telephone Encounter (Signed)
Called and spoke with Pt. I asked how she is feeling today, Pt states she is feeling better today. She states she is unable to take all the prednisone at once, she has been breaking the dose up. I asked if she rcvd my VM I left yesterday, she said she has been able to get out of the house today and did not get it. Before I could advise her of the MRI recommendation, she mentioned she is going for a 2nd opinion to another Neurologist. She felt like she was not getting any answers. I advised her of the MRI, but if she was to see another Dr, she may want to talk to the other Neurologist about an MRI. Pt said she will.

## 2017-11-26 ENCOUNTER — Encounter: Payer: Self-pay | Admitting: *Deleted

## 2017-11-27 ENCOUNTER — Ambulatory Visit: Payer: PRIVATE HEALTH INSURANCE | Admitting: Neurology

## 2017-12-30 ENCOUNTER — Encounter

## 2017-12-30 ENCOUNTER — Encounter: Payer: Self-pay | Admitting: Neurology

## 2017-12-30 ENCOUNTER — Ambulatory Visit: Payer: PRIVATE HEALTH INSURANCE | Admitting: Neurology

## 2017-12-30 ENCOUNTER — Telehealth: Payer: Self-pay | Admitting: Neurology

## 2017-12-30 DIAGNOSIS — G43711 Chronic migraine without aura, intractable, with status migrainosus: Secondary | ICD-10-CM

## 2017-12-30 MED ORDER — NORTRIPTYLINE HCL 25 MG PO CAPS
25.0000 mg | ORAL_CAPSULE | Freq: Every day | ORAL | 4 refills | Status: DC
Start: 2017-12-30 — End: 2018-01-10

## 2017-12-30 MED ORDER — TRAZODONE HCL 50 MG PO TABS: 50.0000 mg | ORAL_TABLET | Freq: Every day | ORAL | 4 refills | Status: DC

## 2017-12-30 NOTE — Telephone Encounter (Signed)
Patient was brought into office today as a new start Botox. I went over the new patient packet with her, scheduled an apt and talked about the SP and copay assistance. I gave her my name and number if she has questions so that she can call me.

## 2017-12-30 NOTE — Progress Notes (Signed)
Nerve block w/o steroid: Pt signed consent  0.5% Bupivocaine 9 mL LOT: [id number] EXP: 12/2020  2% Lidocaine 9 mL LOT: [id number] EXP: 05/18/2019

## 2017-12-30 NOTE — Progress Notes (Addendum)
[facility]    Provider:  Dr Lucia Gaskins Referring Provider: Darrow Bussing, MD Primary Care Physician:  Darrow Bussing, MD  CC:  Second opinion on headaches  HPI:  Hue Steveson is a 47 y.o. female here as requested by Dr. Docia Chuck for migraines. PMHx anxiety, suicide attempt, tobacco use, ADHD. She has had a continuousheadache since July 27th. She has Daily headaches and >15 migraine days a month for over a year but worsening to daily migraines since July 27th. The headache is unilateral, but can be bilateral temporal and periorbital, throbbing/pulsating/pounding, +photo/phonophobia, +nasuea, +vomiting, movement makes it worse, no aura, she takes abortive medication daily, she has no medication overuse, unknown inciting events, she has been to other neurologists, she has been to [facility], she has been to the emergency room and has been admitted. Severe pain. Migraine lasts 24-72 hours. She denies any OTC medication use since July. She even stopped the Benadryl. She describes lots of pressure. No aura. No medication overuse, no aura. She has been to the eye doctor, she has had imaging that is normal. The episodes are severe. She has associated tingling and some dizziness.   meds tried: butalbital, imitrex, advil, tylenol, excedrin, topamax, benadryl, ativan, DHE, compazine, toradol, zomig, nortriptyline, prednisone, nortriptyline, trazodone  Reviewed notes, labs and imaging from outside physicians, which showed:  Personally reviewed images on CD patient brought MRI brain and MRA head appears normal  Reviewed notes from headache clinic [facility] 12/2017. Migraine for 19 days, intense pressure, light sensitivity, has been to the ED, has seen neurosurgery, taking excedrin and imitrex, daily headache in the forehead and temporal regions, wors ein the afternoons, headaches for 20 years. Neuro exam was normal. Medication overuse was disussed. Also seen prior 8/16 for 3 weeks of  throbbing headaches. She is "going insane", headaches 3-4 days a week.   11/20/2017: BUn 11, creatinine 0.59  Review of Systems: Patient complains of symptoms per HPI as well as the following symptoms: headache, numbness, dizziness, anxiety. Pertinent negatives and positives per HPI. All others negative.   Social History   Socioeconomic History  . Marital status: Married    Spouse name: Renae Fickle  . Number of children: 2  . Years of education: some college  . Highest education level: Some college, no degree  Occupational History  . Occupation: Scientist, physiological: [employer]  Social Needs  . Financial resource strain: Not on file  . Food insecurity:    Worry: Not on file    Inability: Not on file  . Transportation needs:    Medical: Not on file    Non-medical: Not on file  Tobacco Use  . Smoking status: Current Every Day Smoker    Packs/day: 1.00    Years: 32.00    Pack years: 32.00    Types: Cigarettes  . Smokeless tobacco: Never Used  Substance and Sexual Activity  . Alcohol use: Yes    Comment: occ maybe 1-2 a month  . Drug use: No    Comment: cocaine in the past  . Sexual activity: Not on file  Lifestyle  . Physical activity:    Days per week: Not on file    Minutes per session: Not on file  . Stress: Not on file  Relationships  . Social connections:    Talks on phone: Not on file    Gets together: Not on file    Attends religious service: Not on file    Active member  of club or organization: Not on file    Attends meetings of clubs or organizations: Not on file    Relationship status: Not on file  . Intimate partner violence:    Fear of current or ex partner: Not on file    Emotionally abused: Not on file    Physically abused: Not on file    Forced sexual activity: Not on file  Other Topics Concern  . Not on file  Social History Narrative   Patient is left-handed. She lives with her husband and daughter in a 1 story house. Her  daughter leaves for [employer] next week. She has not been exercising in the last year.    3 cups coffee daily.    Family History  Problem Relation Age of Onset  . Glaucoma Mother   . Heart disease Mother   . Deep vein thrombosis Father   . Migraines Sister   . Stroke Maternal Grandmother   . Aneurysm Maternal Grandmother   . Heart disease Maternal Grandmother   . Diabetes Paternal Uncle        adult onset    Past Medical History:  Diagnosis Date  . Anxiety   . Depression   . Deviated septum   . Headache   . Hx of suicide attempt 2010   overdose on Trileptal, has been on Seroquel in the past  . Hyperlipidemia   . Tobacco use     Past Surgical History:  Procedure Laterality Date  . ABDOMINAL HYSTERECTOMY    . BREAST ENHANCEMENT SURGERY    . CESAREAN SECTION     x 2  . SHOULDER SURGERY Left    impingement    Current Outpatient Medications  Medication Sig Dispense Refill  . nortriptyline (PAMELOR) 25 MG capsule Take 25 mg by mouth at bedtime.    . prochlorperazine (COMPAZINE) 10 MG tablet Take 10 mg by mouth as needed for nausea or vomiting.    . topiramate (TOPAMAX) 100 MG tablet Take 100 mg by mouth. Every morning.    . topiramate (TOPAMAX) 50 MG tablet Take 50 mg by mouth. Every night    . traZODone (DESYREL) 50 MG tablet Take 50 mg by mouth at bedtime.    . butalbital-acetaminophen-caffeine (FIORICET, ESGIC) 50-325-40 MG tablet Take 1 tablet by mouth every 6 (six) hours as needed for headache.     No current facility-administered medications for this visit.     Allergies as of 12/30/2017  . (No Known Allergies)    Vitals: BP 113/76 (BP Location: Left Arm, Patient Position: Sitting)   Pulse 74   Ht 5\' 7"  (1.702 m)   Wt 162 lb (73.5 kg)   LMP  (LMP Unknown)   BMI 25.37 kg/m  Last Weight:  Wt Readings from Last 1 Encounters:  12/30/17 162 lb (73.5 kg)   Last Height:   Ht Readings from Last 1 Encounters:  12/30/17 5\' 7"  (1.702 m)   Physical  exam: Exam: Gen: NAD, conversant, well nourised, well groomed                     CV: RRR, no MRG. No Carotid Bruits. No peripheral edema, warm, nontender Eyes: Conjunctivae clear without exudates or hemorrhage  Neuro: Detailed Neurologic Exam  Speech:    Speech is normal; fluent and spontaneous with normal comprehension.  Cognition:    The patient is oriented to person, place, and time;     recent and remote memory intact;  language fluent;     normal attention, concentration,     fund of knowledge Cranial Nerves:    The pupils are equal, round, and reactive to light. The fundi are normal and spontaneous venous pulsations are present. Visual fields are full to finger confrontation. Extraocular movements are intact. Trigeminal sensation is intact and the muscles of mastication are normal. The face is symmetric. The palate elevates in the midline. Hearing intact. Voice is normal. Shoulder shrug is normal. The tongue has normal motion without fasciculations.   Coordination:    Normal finger to nose and heel to shin. Normal rapid alternating movements.   Gait:    Heel-toe and tandem gait are normal.   Motor Observation:    No asymmetry, no atrophy, and no involuntary movements noted. Tone:    Normal muscle tone.    Posture:    Posture is normal. normal erect    Strength:    Strength is V/V in the upper and lower limbs.      Sensation: intact to LT     Reflex Exam:  DTR's:    Deep tendon reflexes in the upper and lower extremities are normal bilaterally.   Toes:    The toes are downgoing bilaterally.   Clonus:    Clonus is absent.       Assessment/Plan:  47 year old with chronic intractable headaches.   Excellent candidate for Botox for migraine, failed multiple classes of medications Discussed aimovig she is not on CGRP currently will discuss at Botox appointment  To prevent or relieve headaches, try the following: Cool Compress. Lie down and place a cool  compress on your head.  Avoid headache triggers. If certain foods or odors seem to have triggered your migraines in the past, avoid them. A headache diary might help you identify triggers.  Include physical activity in your daily routine. Try a daily walk or other moderate aerobic exercise.  Manage stress. Find healthy ways to cope with the stressors, such as delegating tasks on your to-do list.  Practice relaxation techniques. Try deep breathing, yoga, massage and visualization.  Eat regularly. Eating regularly scheduled meals and maintaining a healthy diet might help prevent headaches. Also, drink plenty of fluids.  Follow a regular sleep schedule. Sleep deprivation might contribute to headaches Consider biofeedback. With this mind-body technique, you learn to control certain bodily functions - such as muscle tension, heart rate and blood pressure - to prevent headaches or reduce headache pain.    Proceed to emergency room if you experience new or worsening symptoms or symptoms do not resolve, if you have new neurologic symptoms or if headache is severe, or for any concerning symptom.   Provided education and documentation from American headache Society toolbox including articles on: chronic migraine medication overuse headache, chronic migraines, prevention of migraines, behavioral and other nonpharmacologic treatments for headache.   All procedures a documented blood were medically necessary, reasonable and appropriate based on the patient's history, medical diagnosis and physician opinion. Verbal informed consent was obtained from the patient, patient was informed of potential risk of procedure, including bruising, bleeding, hematoma formation, infection, muscle weakness, muscle pain, numbness, transient hypertension, transient hyperglycemia and transient insomnia among others. All areas injected were topically clean with isopropyl rubbing alcohol. Nonsterile nonlatex gloves were worn during the  procedure.  4. Supraorbital/trigeminal nerve block (64400): Supraorbital nerve site was identified along the incision of the frontal bone on the orbital/supraorbital ridge. Medication was injected into the left and right supraorbital nerve areas.  Patient's condition is associated with inflammation of the supraorbital and associated muscle groups. Injection was deemed medically necessary, reasonable and appropriate. Injection represents a separate and unique surgical service.  Cc: Darrow Bussing, MD  Naomie Dean, MD  [facility] [address] [place], [place] [address]  Phone [phone] Fax [phone]-

## 2017-12-30 NOTE — Patient Instructions (Addendum)
Erenumab: Patient drug information Access [employer] Online here. Copyright 1978-2019 [employer] All rights reserved. (For additional information see "Erenumab: Drug information") Brand Names: [place]  Aimovig  Brand Names: [place]  Aimovig  What is this drug used for?   It is used to prevent migraine headaches.  What do I need to tell my doctor BEFORE I take this drug?   If you have an allergy to this drug or any part of this drug.   If you are allergic to any drugs like this one, any other drugs, foods, or other substances. Tell your doctor about the allergy and what signs you had, like rash; hives; itching; shortness of breath; wheezing; cough; swelling of face, lips, tongue, or throat; or any other signs.   This drug may interact with other drugs or health problems.   Tell your doctor and pharmacist about all of your drugs (prescription or OTC, natural products, vitamins) and health problems. You must check to make sure that it is safe for you to take this drug with all of your drugs and health problems. Do not start, stop, or change the dose of any drug without checking with your doctor.  What are some things I need to know or do while I take this drug?   Tell all of your health care providers that you take this drug. This includes your doctors, nurses, pharmacists, and dentists.   If you have a latex allergy, talk with your doctor.   Tell your doctor if you are pregnant or plan on getting pregnant. You will need to talk about the benefits and risks of using this drug while you are pregnant.   Tell your doctor if you are breast-feeding. You will need to talk about any risks to your baby.  What are some side effects that I need to call my doctor about right away?   WARNING/CAUTION: Even though it may be rare, some people may have very bad and sometimes deadly side effects when taking a drug. Tell your doctor or get medical help right away if you have any of the following signs or symptoms  that may be related to a very bad side effect:   Signs of an allergic reaction, like rash; hives; itching; red, swollen, blistered, or peeling skin with or without fever; wheezing; tightness in the chest or throat; trouble breathing, swallowing, or talking; unusual hoarseness; or swelling of the mouth, face, lips, tongue, or throat.  What are some other side effects of this drug?   All drugs may cause side effects. However, many people have no side effects or only have minor side effects. Call your doctor or get medical help if any of these side effects or any other side effects bother you or do not go away:   Redness or swelling where the shot is given.   Pain where the shot was given.   Constipation.   These are not all of the side effects that may occur. If you have questions about side effects, call your doctor. Call your doctor for medical advice about side effects.   You may report side effects to your national health agency.  How is this drug best taken?   Use this drug as ordered by your doctor. Read all information given to you. Follow all instructions closely.   It is given as a shot into the fatty part of the skin on the top of the thigh, belly area, or upper arm.   If you will be giving   yourself the shot, your doctor or nurse will teach you how to give the shot.   Follow how to use as you have been told by the doctor or read the package insert.   If stored in a refrigerator, let this drug come to room temperature before using it. Leave it at room temperature for at least 30 minutes. Do not heat this drug.   Protect from heat and sunlight.   Do not shake.   Do not give into skin that is irritated, bruised, red, infected, or scarred.   Do not use if the solution is cloudy, leaking, or has particles.   Do not use if solution changes color.   Throw away after using. Do not use the device more than 1 time.   Throw away needles in a needle/sharp disposal box. Do not reuse needles or  other items. When the box is full, follow all local rules for getting rid of it. Talk with a doctor or pharmacist if you have any questions.  What do I do if I miss a dose?   Take a missed dose as soon as you think about it.   After taking a missed dose, start a new schedule based on when the dose is taken.  How do I store and/or throw out this drug?   Store in a refrigerator. Do not freeze.   Store in the carton to protect from light.   Do not use if it has been frozen.   If you drop this drug on a hard surface, do not use it.   If needed, you may store at room temperature for up to 7 days. Write down the date you take this drug out of the refrigerator. If stored at room temperature and not used within 7 days, throw this drug away.   Do not put this drug back in the refrigerator after it has been stored at room temperature.   Keep all drugs in a safe place. Keep all drugs out of the reach of children and pets.   Throw away unused or expired drugs. Do not flush down a toilet or pour down a drain unless you are told to do so. Check with your pharmacist if you have questions about the best way to throw out drugs. There may be drug take-back programs in your area.  General drug facts   If your symptoms or health problems do not get better or if they become worse, call your doctor.   Do not share your drugs with others and do not take anyone else's drugs.   Keep a list of all your drugs (prescription, natural products, vitamins, OTC) with you. Give this list to your doctor.   Talk with the doctor before starting any new drug, including prescription or OTC, natural products, or vitamins.   Some drugs may have another patient information leaflet. If you have any questions about this drug, please talk with your doctor, nurse, pharmacist, or other health care provider.   If you think there has been an overdose, call your poison control center or get medical care right away. Be ready to tell or show  what was taken, how much, and when it happened.   

## 2018-01-06 ENCOUNTER — Telehealth: Payer: Self-pay | Admitting: *Deleted

## 2018-01-06 NOTE — Telephone Encounter (Signed)
[facility].

## 2018-01-07 NOTE — Telephone Encounter (Signed)
[id number] and [id number] authorization [id number] (04/07/18). DW

## 2018-01-08 NOTE — Telephone Encounter (Addendum)
Late entry; the patient called and stated that she had called her insurance wanted to use a pharmacy through her insurance. She was upset because she said they had not received anything from our office. I told her that with her insurance we normally do B/B. She told me that she has had a migraine since July and she has been waiting months to be seen in our office and now we are making her wait to get these injections even longer. Her New Patient apt with Dr. Lucia Gaskins was only 8 days ago. She said that her insurance had been trying to send [place] several faxes and they had not heard anything from [place]. She told me that she was very upset by this and was frustrated at our office because she feels that now we are going to make her "wait even longer for these injections". I informed Ms. Garn that she would still be able to come Friday and there would be no delay in her care. I asked her which fax number they were using and she stated "I am assuming the one on your website". I explained that we have several fax numbers in this office and they may be using an incorrect number. I told her that we would be happy to attempt to use the pharmacy if that's what she would like, I would try to find a pharmacy to fill the medication through her insurance. She was appreciative.   I called Pam and left a message for her to call me back. I also sent her an email. I started a pharmacy PA online and called a script into the pharmacy.

## 2018-01-09 NOTE — Telephone Encounter (Addendum)
I spoke with the patient and informed her of the information below. I explained to her that the pharmacy she was requesting to use was still pending the medication and she would have to be ran as b/b like we had discussed in previous conversation. She stated "Dont blame them because they have been trying to reach your office. They have faxed you 3 times last week" I explained to her that per our last conversation (detailed below), I did not received those faxes and therefor was not aware that they were trying to reach [place]. The patient said that her main priority was feeling better and I expressed to her that was also our priority which is why we worked her in for an apt less than two weeks from being seen for her NP apt. I explained that this was an extremely fast turn around time.

## 2018-01-09 NOTE — Telephone Encounter (Signed)
I called the patient to make her aware of the following but she did not answer so I left a VM asking her to call me back.   Tammy from [facility] medical benefits team stated stated B/B elidgible, deductible has been met, out of pocket met. Patient is covered at 100 percent. VMT#[id number].

## 2018-01-09 NOTE — Telephone Encounter (Signed)
I called the pharmacy benefits to check status of authorization and it is still pending.

## 2018-01-09 NOTE — Telephone Encounter (Signed)
Confirmed with Angie in billing that we are in network with [employer].

## 2018-01-09 NOTE — Telephone Encounter (Signed)
Pt office notes and [facility] records @ the front desk for p/u.

## 2018-01-09 NOTE — Telephone Encounter (Signed)
Patient has requested the we have all of her records for tomorrow from [facility] and Dr. Lucia Gaskins. Is this a possibility [facility] Kidney?

## 2018-01-10 ENCOUNTER — Ambulatory Visit: Payer: PRIVATE HEALTH INSURANCE | Admitting: Neurology

## 2018-01-10 DIAGNOSIS — G43711 Chronic migraine without aura, intractable, with status migrainosus: Secondary | ICD-10-CM

## 2018-01-10 DIAGNOSIS — Z79899 Other long term (current) drug therapy: Secondary | ICD-10-CM

## 2018-01-10 MED ORDER — NORTRIPTYLINE HCL 50 MG PO CAPS: 50.0000 mg | ORAL_CAPSULE | Freq: Every day | ORAL | 11 refills | Status: DC

## 2018-01-10 MED ORDER — BACLOFEN 5 MG PO TABS: 5.0000 mg | ORAL_TABLET | Freq: Three times a day (TID) | ORAL | 3 refills | Status: DC | PRN

## 2018-01-10 MED ORDER — ERENUMAB-AOOE 140 MG/ML ~~LOC~~ SOAJ: 140.0000 mg | SUBCUTANEOUS | 11 refills | Status: DC

## 2018-01-10 MED ORDER — KETOROLAC TROMETHAMINE 60 MG/2ML IM SOLN
60.0000 mg | Freq: Once | INTRAMUSCULAR | Status: AC
  Administered 2018-01-10: 60 mg via INTRAMUSCULAR

## 2018-01-10 NOTE — Telephone Encounter (Signed)
I ha a discussion with patient today that our staff is excellent and the turn around time for her botox was tremendous. Also we have been very attentive and provided wonderful medical services. I let patient know that it is part our policy that we treat everyone in the office with respect and she said she was just not feeling well.

## 2018-01-10 NOTE — Progress Notes (Signed)
Consent Form Botulism Toxin Injection For Chronic Migraine   +masseters and temporals and temples, not eyes, not levator scapulae. She takes 150mg  topiramate daily.  Reviewed orally with patient, additionally signature is on file:  Botulism toxin has been approved by the Federal drug administration for treatment of chronic migraine. Botulism toxin does not cure chronic migraine and it may not be effective in some patients.  The administration of botulism toxin is accomplished by injecting a small amount of toxin into the muscles of the neck and head. Dosage must be titrated for each individual. Any benefits resulting from botulism toxin tend to wear off after 3 months with a repeat injection required if benefit is to be maintained. Injections are usually done every 3-4 months with maximum effect peak achieved by about 2 or 3 weeks. Botulism toxin is expensive and you should be sure of what costs you will incur resulting from the injection.  The side effects of botulism toxin use for chronic migraine may include:   -Transient, and usually mild, facial weakness with facial injections  -Transient, and usually mild, head or neck weakness with head/neck injections  -Reduction or loss of forehead facial animation due to forehead muscle weakness  -Eyelid drooping  -Dry eye  -Pain at the site of injection or bruising at the site of injection  -Double vision  -Potential unknown long term risks  Contraindications: You should not have Botox if you are pregnant, nursing, allergic to albumin, have an infection, skin condition, or muscle weakness at the site of the injection, or have myasthenia gravis, Lambert-Eaton syndrome, or ALS.  It is also possible that as with any injection, there may be an allergic reaction or no effect from the medication. Reduced effectiveness after repeated injections is sometimes seen and rarely infection at the injection site may occur. All care will be taken to prevent  these side effects. If therapy is given over a long time, atrophy and wasting in the muscle injected may occur. Occasionally the patient's become refractory to treatment because they develop antibodies to the toxin. In this event, therapy needs to be modified.  I have read the above information and consent to the administration of botulism toxin.    BOTOX PROCEDURE NOTE FOR MIGRAINE HEADACHE    Contraindications and precautions discussed with patient(above). Aseptic procedure was observed and patient tolerated procedure. Procedure performed by Dr. Artemio Aly  The condition has existed for more than 6 months, and pt does not have a diagnosis of ALS, Myasthenia Gravis or Lambert-Eaton Syndrome.  Risks and benefits of injections discussed and pt agrees to proceed with the procedure.  Written consent obtained  These injections are medically necessary. Pt  receives good benefits from these injections. These injections do not cause sedations or hallucinations which the oral therapies may cause.  Indication/Diagnosis: chronic migraine BOTOX(J0585) injection was performed according to protocol by [employer]. 200 units of BOTOX was dissolved into 4 cc NS.   NDC: [address]-91   Description of procedure:  The patient was placed in a sitting position. The standard protocol was used for Botox as follows, with 5 units of Botox injected at each site:   -Procerus muscle, midline injection  -Corrugator muscle, bilateral injection  -Frontalis muscle, bilateral injection, with 2 sites each side, medial injection was performed in the upper one third of the frontalis muscle, in the region vertical from the medial inferior edge of the superior orbital rim. The lateral injection was again in the upper one third of the  forehead vertically above the lateral limbus of the cornea, 1.5 cm lateral to the medial injection site.  -Temporalis muscle injection, 4 sites, bilaterally. The first injection was 3 cm above  the tragus of the ear, second injection site was 1.5 cm to 3 cm up from the first injection site in line with the tragus of the ear. The third injection site was 1.5-3 cm forward between the first 2 injection sites. The fourth injection site was 1.5 cm posterior to the second injection site.  -Occipitalis muscle injection, 3 sites, bilaterally. The first injection was done one half way between the occipital protuberance and the tip of the mastoid process behind the ear. The second injection site was done lateral and superior to the first, 1 fingerbreadth from the first injection. The third injection site was 1 fingerbreadth superiorly and medially from the first injection site.  -Cervical paraspinal muscle injection, 2 sites, bilateral knee first injection site was 1 cm from the midline of the cervical spine, 3 cm inferior to the lower border of the occipital protuberance. The second injection site was 1.5 cm superiorly and laterally to the first injection site.  -Trapezius muscle injection was performed at 3 sites, bilaterally. The first injection site was in the upper trapezius muscle halfway between the inflection point of the neck, and the acromion. The second injection site was one half way between the acromion and the first injection site. The third injection was done between the first injection site and the inflection point of the neck.   Will return for repeat injection in 3 months.   A 200 unit sof Botox was used, 155 units were injected, the rest of the Botox was wasted. The patient tolerated the procedure well, there were no complications of the above procedure.

## 2018-01-10 NOTE — Patient Instructions (Addendum)
Baclofen 5mg  three times daily (may cause drowsiness don;t drive until you know how it makes you feel) Increase Nortriptyline to 50mg  at bedtime Will prescribe Aimovig Continue Topamax and get a lab  Baclofen tablets What is this medicine? BACLOFEN (BAK loe fen) helps relieve spasms and cramping of muscles. It may be used to treat symptoms of multiple sclerosis or spinal cord injury. This medicine may be used for other purposes; ask your health care provider or pharmacist if you have questions. COMMON BRAND NAME(S): ED Baclofen, Lioresal What should I tell my health care provider before I take this medicine? They need to know if you have any of these conditions: -kidney disease -seizures -stroke -an unusual or allergic reaction to baclofen, other medicines, foods, dyes, or preservatives -pregnant or trying to get pregnant -breast-feeding How should I use this medicine? Take this medicine by mouth. Swallow it with a drink of water. Follow the directions on the prescription label. Do not take more medicine than you are told to take. Talk to your pediatrician regarding the use of this medicine in children. Special care may be needed. Overdosage: If you think you have taken too much of this medicine contact a poison control center or emergency room at once. NOTE: This medicine is only for you. Do not share this medicine with others. What if I miss a dose? If you miss a dose, take it as soon as you can. If it is almost time for your next dose, take only that dose. Do not take double or extra doses. What may interact with this medicine? Do not take this medication with any of the following medicines: -narcotic medicines for cough This medicine may also interact with the following medications: -alcohol -antihistamines for allergy, cough and cold -certain medicines for anxiety or sleep -certain medicines for depression like amitriptyline, fluoxetine, sertraline -certain medicines for  seizures like phenobarbital, primidone -general anesthetics like halothane, isoflurane, methoxyflurane, propofol -local anesthetics like lidocaine, pramoxine, tetracaine -medicines that relax muscles for surgery -narcotic medicines for pain -phenothiazines like chlorpromazine, mesoridazine, prochlorperazine, thioridazine This list may not describe all possible interactions. Give your health care provider a list of all the medicines, herbs, non-prescription drugs, or dietary supplements you use. Also tell them if you smoke, drink alcohol, or use illegal drugs. Some items may interact with your medicine. What should I watch for while using this medicine? Tell your doctor or health care professional if your symptoms do not start to get better or if they get worse. Do not suddenly stop taking your medicine. If you do, you may develop a severe reaction. If your doctor wants you to stop the medicine, the dose will be slowly lowered over time to avoid any side effects. Follow the advice of your doctor. You may get drowsy or dizzy. Do not drive, use machinery, or do anything that needs mental alertness until you know how this medicine affects you. Do not stand or sit up quickly, especially if you are an older patient. This reduces the risk of dizzy or fainting spells. Alcohol may interfere with the effect of this medicine. Avoid alcoholic drinks. If you are taking another medicine that also causes drowsiness, you may have more side effects. Give your health care provider a list of all medicines you use. Your doctor will tell you how much medicine to take. Do not take more medicine than directed. Call emergency for help if you have problems breathing or unusual sleepiness. What side effects may I notice from receiving this  medicine? Side effects that you should report to your doctor or health care professional as soon as possible: -allergic reactions like skin rash, itching or hives, swelling of the face, lips,  or tongue -breathing problems -changes in emotions or moods -changes in vision -chest pain -fast, irregular heartbeat -feeling faint or lightheaded, falls -hallucinations -loss of balance or coordination -ringing of the ears -seizures -trouble passing urine or change in the amount of urine -trouble walking -unusually weak or tired Side effects that usually do not require medical attention (report to your doctor or health care professional if they continue or are bothersome): -changes in taste -confusion -constipation -diarrhea -dry mouth -headache -muscle weakness -nausea, vomiting -trouble sleeping This list may not describe all possible side effects. Call your doctor for medical advice about side effects. You may report side effects to FDA at 1-800-FDA-1088. Where should I keep my medicine? Keep out of the reach of children. Store at room temperature between 15 and 30 degrees C (59 and 86 degrees F). Keep container tightly closed. Throw away any unused medicine after the expiration date. NOTE: This sheet is a summary. It may not cover all possible information. If you have questions about this medicine, talk to your doctor, pharmacist, or health care provider.  2018 Elsevier/Gold Standard (2015-01-10 15:56:23)

## 2018-01-10 NOTE — Progress Notes (Signed)
Toradol 60 mg Injection given IM per VO from Dr. Lucia Gaskins. 30mg  in L Deltoid and 30 mg in R Deltoid. Pt tolerated well and left in NAD.

## 2018-01-10 NOTE — Progress Notes (Signed)
Botox- 100 units x 2 vials Lot: [id number] Expiration: 06/2020 NDC: 0023-1145-01  Bacteriostatic 0.9% Sodium Chloride- 4mL total Lot: [id number] Expiration: 01/15/2019 NDC: 0409-1966-02  Dx: G43.711 B/B   

## 2018-01-13 ENCOUNTER — Telehealth: Payer: Self-pay

## 2018-01-13 LAB — TOPIRAMATE LEVEL: TOPIRAMATE LVL: 4.7 ug/mL (ref 2.0–25.0)

## 2018-01-13 NOTE — Telephone Encounter (Signed)
We received a prior authorization request for aimovig 140mg /mL. I have completed and submitted the PA on Cover My Meds and should have a determination within 48-72 hours.  Cover My Meds Key: [id number]

## 2018-01-14 ENCOUNTER — Other Ambulatory Visit: Payer: Self-pay | Admitting: Neurology

## 2018-01-14 MED ORDER — TOPIRAMATE 100 MG PO TABS: 100.0000 mg | ORAL_TABLET | Freq: Two times a day (BID) | ORAL | 5 refills | Status: DC

## 2018-01-14 NOTE — Telephone Encounter (Signed)
[facility] needed additional information for Aimovig PA and requested chart notes. Sent office visit to them via fax. Received a receipt of confirmation.

## 2018-01-15 ENCOUNTER — Ambulatory Visit: Payer: PRIVATE HEALTH INSURANCE | Admitting: Neurology

## 2018-01-15 ENCOUNTER — Telehealth: Payer: Self-pay

## 2018-01-15 NOTE — Telephone Encounter (Signed)
Received a fax from [facility] [address] [place], [place] [address] phone number: [phone] fax number: [phone] for a clarification on Topiramate which read 100 mg by mouth two times daily every morning.  Per Dr. Trevor Mace note patient is to take 100 mg in the morning and 100 mg at bedtime. Corrections have been faxed back to [facility].

## 2018-01-16 ENCOUNTER — Ambulatory Visit: Payer: PRIVATE HEALTH INSURANCE | Admitting: Neurology

## 2018-01-16 ENCOUNTER — Encounter

## 2018-01-17 NOTE — Telephone Encounter (Signed)
Received determination from [employer]. Aimovig has been denied because patient is also on Botox. Insurance will not cover Aimovig unless Botox is discontinued.  HDI number: [id number]  If appeal is wanted, fax appeal to [phone]. Appeal may be filed within 180 days. For any questions call [phone].  Patient should have copay card to get the Aimovig free for a year regardless of this denial.   Called pt and LVM (ok per DPR) informing her of the denial of Aimovig due to her also being on Botox. Advised her that she should have a copay card that she can take to the pharmacy to get the medication free for a year. Left office number and hours in message and encouraged a call back if she had any questions or concerns.

## 2018-01-20 NOTE — Telephone Encounter (Signed)
Jacki Cones called from [facility] and said that the fax they received had no changes from the previous. I read the message below to her for clarification. No call needed.

## 2018-03-03 ENCOUNTER — Other Ambulatory Visit: Payer: Self-pay | Admitting: Neurology

## 2018-03-03 ENCOUNTER — Encounter: Payer: Self-pay | Admitting: *Deleted

## 2018-03-03 ENCOUNTER — Other Ambulatory Visit: Payer: Self-pay | Admitting: *Deleted

## 2018-03-03 MED ORDER — NORTRIPTYLINE HCL 25 MG PO CAPS: 25.0000 mg | ORAL_CAPSULE | Freq: Every day | ORAL | 3 refills | Status: DC

## 2018-03-03 NOTE — Telephone Encounter (Signed)
Emailed pt to ask her if she's taking 25 mg or 50 mg.

## 2018-03-03 NOTE — Progress Notes (Signed)
Spoke with patient. She stated that the Nortriptyline 50 mg was too much and she has been taking 25 mg. Dr. Lucia GaskinsAhern was updated by pt in recent mychart email. Discussed with Dr. Lucia GaskinsAhern today. The 50 mg capsule was d/c and a new prescription was placed for the 25 mg capsule. 1 capsule qhs, #90, refills 3.

## 2018-04-03 ENCOUNTER — Ambulatory Visit: Payer: PRIVATE HEALTH INSURANCE | Admitting: Neurology

## 2018-04-04 ENCOUNTER — Ambulatory Visit (INDEPENDENT_AMBULATORY_CARE_PROVIDER_SITE_OTHER): Payer: PRIVATE HEALTH INSURANCE | Admitting: Neurology

## 2018-04-04 DIAGNOSIS — M7918 Myalgia, other site: Secondary | ICD-10-CM

## 2018-04-04 DIAGNOSIS — G43711 Chronic migraine without aura, intractable, with status migrainosus: Secondary | ICD-10-CM

## 2018-04-04 NOTE — Progress Notes (Signed)
Consent Form Botulism Toxin Injection For Chronic Migraine   Interval history 04/04/2018: +masseters and temporals and temples +LS. She takes 150mg  topiramate daily. She does not want to go down on the Topiramate. She is feeling exceptionally better  >70% improvement in migraine frequency and severity. On Maxalt acutely. Baclofen and increased nortrip had side effects. She has lots of TMD/TMJ. Will order dry needling for cervical myofascial pain syndrome.   Orders Placed This Encounter  Procedures  . Ambulatory referral to Physical Therapy     Reviewed orally with patient, additionally signature is on file:  Botulism toxin has been approved by the Federal drug administration for treatment of chronic migraine. Botulism toxin does not cure chronic migraine and it may not be effective in some patients.  The administration of botulism toxin is accomplished by injecting a small amount of toxin into the muscles of the neck and head. Dosage must be titrated for each individual. Any benefits resulting from botulism toxin tend to wear off after 3 months with a repeat injection required if benefit is to be maintained. Injections are usually done every 3-4 months with maximum effect peak achieved by about 2 or 3 weeks. Botulism toxin is expensive and you should be sure of what costs you will incur resulting from the injection.  The side effects of botulism toxin use for chronic migraine may include:   -Transient, and usually mild, facial weakness with facial injections  -Transient, and usually mild, head or neck weakness with head/neck injections  -Reduction or loss of forehead facial animation due to forehead muscle weakness  -Eyelid drooping  -Dry eye  -Pain at the site of injection or bruising at the site of injection  -Double vision  -Potential unknown long term risks  Contraindications: You should not have Botox if you are pregnant, nursing, allergic to albumin, have an infection, skin  condition, or muscle weakness at the site of the injection, or have myasthenia gravis, Lambert-Eaton syndrome, or ALS.  It is also possible that as with any injection, there may be an allergic reaction or no effect from the medication. Reduced effectiveness after repeated injections is sometimes seen and rarely infection at the injection site may occur. All care will be taken to prevent these side effects. If therapy is given over a long time, atrophy and wasting in the muscle injected may occur. Occasionally the patient's become refractory to treatment because they develop antibodies to the toxin. In this event, therapy needs to be modified.  I have read the above information and consent to the administration of botulism toxin.    BOTOX PROCEDURE NOTE FOR MIGRAINE HEADACHE    Contraindications and precautions discussed with patient(above). Aseptic procedure was observed and patient tolerated procedure. Procedure performed by Dr. Artemio Alyoni   The condition has existed for more than 6 months, and pt does not have a diagnosis of ALS, Myasthenia Gravis or Lambert-Eaton Syndrome.  Risks and benefits of injections discussed and pt agrees to proceed with the procedure.  Written consent obtained  These injections are medically necessary. Pt  receives good benefits from these injections. These injections do not cause sedations or hallucinations which the oral therapies may cause.  Description of procedure:  The patient was placed in a sitting position. The standard protocol was used for Botox as follows, with 5 units of Botox injected at each site:   -Procerus muscle, midline injection  -Corrugator muscle, bilateral injection  -Frontalis muscle, bilateral injection, with 2 sites each side, medial injection was  performed in the upper one third of the frontalis muscle, in the region vertical from the medial inferior edge of the superior orbital rim. The lateral injection was again in the upper one  third of the forehead vertically above the lateral limbus of the cornea, 1.5 cm lateral to the medial injection site.  - Levator Scapulae: 5 units bilaterally  -Temporalis muscle injection, 5 sites, bilaterally. The first injection was 3 cm above the tragus of the ear, second injection site was 1.5 cm to 3 cm up from the first injection site in line with the tragus of the ear. The third injection site was 1.5-3 cm forward between the first 2 injection sites. The fourth injection site was 1.5 cm posterior to the second injection site. 5th site laterally in the temporalis  muscleat the level of the outer canthus.  - Patient feels her clenching is a trigger for headaches. +5 units masseter bilaterally   - Patient feels the migraines are centered around the eyes +5 units bilaterally at the outer canthus in the orbicularis occuli  -Occipitalis muscle injection, 3 sites, bilaterally. The first injection was done one half way between the occipital protuberance and the tip of the mastoid process behind the ear. The second injection site was done lateral and superior to the first, 1 fingerbreadth from the first injection. The third injection site was 1 fingerbreadth superiorly and medially from the first injection site.  -Cervical paraspinal muscle injection, 2 sites, bilateral knee first injection site was 1 cm from the midline of the cervical spine, 3 cm inferior to the lower border of the occipital protuberance. The second injection site was 1.5 cm superiorly and laterally to the first injection site.  -Trapezius muscle injection was performed at 3 sites, bilaterally. The first injection site was in the upper trapezius muscle halfway between the inflection point of the neck, and the acromion. The second injection site was one half way between the acromion and the first injection site. The third injection was done between the first injection site and the inflection point of the neck.   Will return for  repeat injection in 3 months.   A 200 unit sof Botox was used, any Botox not injected was wasted. The patient tolerated the procedure well, there were no complications of the above procedure.

## 2018-04-04 NOTE — Progress Notes (Signed)
Botox- 100 units x 2 vials Lot: [id number] Expiration: 08/2020 NDC: 0023-1145-01  Bacteriostatic 0.9% Sodium Chloride- 4mL total Lot: [id number] Expiration: 01/15/2019 NDC: 0409-1966-02  Dx: G43.711 B/B   

## 2018-05-06 ENCOUNTER — Ambulatory Visit: Payer: PRIVATE HEALTH INSURANCE | Admitting: Physical Therapy

## 2018-06-02 ENCOUNTER — Telehealth: Payer: Self-pay | Admitting: Neurology

## 2018-06-02 NOTE — Telephone Encounter (Signed)
Need to call [employer] at [phone] to try and get 04/04/18 apt covered with previous auth. DW.

## 2018-07-07 ENCOUNTER — Telehealth: Payer: Self-pay | Admitting: Neurology

## 2018-07-07 NOTE — Telephone Encounter (Signed)
I called [employer] but their automated system ask that we call back at a later time. Will call back tomorrow.

## 2018-07-08 NOTE — Telephone Encounter (Signed)
I called [employer] and had them retro an auth for the previous date of service. It was approved and authorization number is [id number] (04/05/19). DW

## 2018-07-09 ENCOUNTER — Other Ambulatory Visit: Payer: Self-pay | Admitting: Family Medicine

## 2018-07-09 ENCOUNTER — Ambulatory Visit
Admission: RE | Admit: 2018-07-09 | Discharge: 2018-07-09 | Disposition: A | Discharge status: Home / Self Care | Payer: PRIVATE HEALTH INSURANCE | Source: Ambulatory Visit | Attending: Family Medicine | Admitting: Family Medicine

## 2018-07-09 DIAGNOSIS — J40 Bronchitis, not specified as acute or chronic: Secondary | ICD-10-CM

## 2018-07-10 ENCOUNTER — Telehealth: Payer: Self-pay | Admitting: Neurology

## 2018-07-10 NOTE — Telephone Encounter (Signed)
Pam has been made aware of this. She is the patients advocate through her insurance. DW

## 2018-07-10 NOTE — Telephone Encounter (Signed)
I called the patient's pharmacy benefits manager and they told me to apply for authorization online at [email or web address] I completed this authorization online for Botox.

## 2018-07-11 ENCOUNTER — Ambulatory Visit: Payer: PRIVATE HEALTH INSURANCE | Admitting: Neurology

## 2018-07-17 NOTE — Telephone Encounter (Signed)
Spoke with Dr. Lucia Gaskins & she is fine with patient continuing Aimovig and holding off on Botox for now. Pt stated she is happy with her regimen right now with Aimovig. She notices when it's time for the Aimovig but not the botox. She will keep [place] informed. She verbalized appreciation for the call.

## 2018-07-17 NOTE — Telephone Encounter (Signed)
Per request from [place] [facility] I have faxed clinical information over to them.

## 2018-07-17 NOTE — Telephone Encounter (Signed)
I called patient to confirm she was on Aimovig, she would like to hold off on Botox for right now as long as Dr. Lucia Gaskins is ok with that. Please call and advise.

## 2018-08-12 NOTE — Telephone Encounter (Signed)
Patient called into the office and stated that she would like to start back with the botox now. She has had a lot of symptoms come back and feels the botox was really benefiting her. She was denied by her insurance for Botox because she is currently on a CGRP also. She said she spoke with Elita Quick (her insurance representative) and the reason for the denial was because I included that she was on Aimovig, and that "that information did not have to be included". I explained to Milahn that we have to make her insurance aware if she is on the CGRP because they asked [place]. She wants [place] to appeal this decision with her insurance. [place]ha states that Dr. Lucia Gaskins needs to specify that the Botox helps certain symptoms and the Aimovig helps a different set of symptoms, this way they will approve it.   I called and spoke with Pam at [phone]. She gave me the information to begin the appeal process, she says that they will very rarely make an exception for a patient to be on both treatments but it will require our doctor stating that Jaselle needs both medications for different reasons.   Please advise on how you would like to proceed.

## 2018-08-19 NOTE — Telephone Encounter (Signed)
Noted thanks °

## 2018-08-19 NOTE — Telephone Encounter (Signed)
Toma Copier, I need an appointment with patient first, can you schedule a phone appointment with me to discuss? thanks

## 2018-08-19 NOTE — Telephone Encounter (Signed)
I called and scheduled the patient for a telephone visit on Thursday 05/07. She gave consent to insurance being filed.

## 2018-08-21 ENCOUNTER — Ambulatory Visit (INDEPENDENT_AMBULATORY_CARE_PROVIDER_SITE_OTHER): Payer: PRIVATE HEALTH INSURANCE | Admitting: Neurology

## 2018-08-21 ENCOUNTER — Encounter: Payer: Self-pay | Admitting: Neurology

## 2018-08-21 ENCOUNTER — Other Ambulatory Visit: Payer: Self-pay

## 2018-08-21 DIAGNOSIS — G43711 Chronic migraine without aura, intractable, with status migrainosus: Secondary | ICD-10-CM

## 2018-08-21 NOTE — Addendum Note (Signed)
Addended by: Bertram Savin on: 08/21/2018 12:03 PM   Modules accepted: Orders

## 2018-08-21 NOTE — Progress Notes (Signed)
[facility]    Provider:  Dr Lucia GaskinsAhern Referring Provider: Darrow BussingKoirala, Dibas, MD Primary Care Physician:  Darrow BussingKoirala, Dibas, MD  Virtual Visit via Telephone Note  I connected with Fraser DinLisa D Habig on 08/24/18 at  3:30 PM EDT by telephone and verified that I am speaking with the correct person using two identifiers.  Location: Patient: Home Provider: Office   I discussed the limitations, risks, security and privacy concerns of performing an evaluation and management service by telephone and the availability of in person appointments. I also discussed with the patient that there may be a patient responsible charge related to this service. The patient expressed understanding and agreed to proceed.   Follow Up Instructions:    I discussed the assessment and treatment plan with the patient. The patient was provided an opportunity to ask questions and all were answered. The patient agreed with the plan and demonstrated an understanding of the instructions.   The patient was advised to call back or seek an in-person evaluation if the symptoms worsen or if the condition fails to improve as anticipated.  I provided 30 minutes of non-face-to-face time during this encounter.   Anson FretAhern, Antonia B, MD   CC:  Second opinion on headaches  Interval History 08/21/2018: Patient reports that the cgrp has not worked. Sine she stopped the botox and remained on the Aimovig The migraines have returned since she stopped the botox. At this time she is not takng the Aimovig and would like to go back to the botox. DIscussed this, will try and get both botox and cgrp approved but if we cannot then she will stop the cgrp in favor of botox   HPI 12/30/2017:  Fraser DinLisa D Devargas is a 48 y.o. female here as requested by Dr. Docia ChuckKoirala for migraines. PMHx anxiety, suicide attempt, tobacco use, ADHD. She has had a continuousheadache since July 27th. She has Daily headaches and >15 migraine days a month for over a  year but worsening to daily migraines since July 27th. The headache is unilateral, but can be bilateral temporal and periorbital, throbbing/pulsating/pounding, +photo/phonophobia, +nasuea, +vomiting, movement makes it worse, no aura, she takes abortive medication daily, she has no medication overuse, unknown inciting events, she has been to other neurologists, [facility], she has been to the emergency room and has been admitted. Severe pain. Migraine lasts 24-72 hours. She denies any OTC medication use since July. She even stopped the Benadryl. She describes lots of pressure. No aura. No medication overuse, no aura. She has been to the eye doctor, she has had imaging that is normal. The episodes are severe. She has associated tingling and some dizziness.   meds tried: butalbital, imitrex, advil, tylenol, excedrin, topamax, benadryl, ativan, DHE, compazine, toradol, zomig, nortriptyline, prednisone, nortriptyline, trazodone  Reviewed notes, labs and imaging from outside physicians, which showed:  Personally reviewed images on CD patient brought MRI brain and MRA head appears normal  [facility] Brandy Leah [facility] 12/2017. Migraine for 19 days, intense pressure, light sensitivity, has been to the ED, has seen neurosurgery, taking excedrin and imitrex, daily headache in the forehead and temporal regions, wors ein the afternoons, headaches for 20 years. Neuro exam was normal. Medication overuse was disussed. Also seen prior 8/16 for 3 weeks of throbbing headaches. She is "going insane", headaches 3-4 days a week.   11/20/2017: BUn 11, creatinine 0.59  Review of Systems: Patient complains of symptoms per HPI as well as the following symptoms:  headache, numbness, dizziness, anxiety. Pertinent negatives and positives per HPI. All others negative.   Social History   Socioeconomic History  . Marital status: Married    Spouse name: Renae Fickle  . Number of children: 2  . Years of  education: some college  . Highest education level: Some college, no degree  Occupational History  . Occupation: Scientist, physiological: [employer]  Social Needs  . Financial resource strain: Not on file  . Food insecurity:    Worry: Not on file    Inability: Not on file  . Transportation needs:    Medical: Not on file    Non-medical: Not on file  Tobacco Use  . Smoking status: Current Every Day Smoker    Packs/day: 1.00    Years: 32.00    Pack years: 32.00    Types: Cigarettes  . Smokeless tobacco: Never Used  Substance and Sexual Activity  . Alcohol use: Yes    Comment: occ maybe 1-2 a month  . Drug use: No    Comment: cocaine in the past  . Sexual activity: Not on file  Lifestyle  . Physical activity:    Days per week: Not on file    Minutes per session: Not on file  . Stress: Not on file  Relationships  . Social connections:    Talks on phone: Not on file    Gets together: Not on file    Attends religious service: Not on file    Active member of club or organization: Not on file    Attends meetings of clubs or organizations: Not on file    Relationship status: Not on file  . Intimate partner violence:    Fear of current or ex partner: Not on file    Emotionally abused: Not on file    Physically abused: Not on file    Forced sexual activity: Not on file  Other Topics Concern  . Not on file  Social History Narrative   Patient is left-handed. She lives with her husband and daughter in a 1 story house.  She has not been exercising in the last year.    3 cups coffee daily.    Family History  Problem Relation Age of Onset  . Glaucoma Mother   . Heart disease Mother   . Deep vein thrombosis Father   . Migraines Sister   . Stroke Maternal Grandmother   . Aneurysm Maternal Grandmother   . Heart disease Maternal Grandmother   . Diabetes Paternal Uncle        adult onset    Past Medical History:  Diagnosis Date  . Anxiety   . Depression    . Deviated septum   . Headache   . Hx of suicide attempt 2010   overdose on Trileptal, has been on Seroquel in the past  . Hyperlipidemia   . Tobacco use     Past Surgical History:  Procedure Laterality Date  . ABDOMINAL HYSTERECTOMY    . BREAST ENHANCEMENT SURGERY    . CESAREAN SECTION     x 2  . SHOULDER SURGERY Left    impingement    Current Outpatient Medications  Medication Sig Dispense Refill  . Erenumab-aooe (AIMOVIG) 140 MG/ML SOAJ Inject 140 mg into the skin every 30 (thirty) days. 1 pen 11  . nortriptyline (PAMELOR) 25 MG capsule Take 1 capsule (25 mg total) by mouth at bedtime. 90 capsule 3  . prochlorperazine (COMPAZINE) 10 MG tablet Take  10 mg by mouth as needed for nausea or vomiting.    . topiramate (TOPAMAX) 100 MG tablet Take 1 tablet (100 mg total) by mouth 2 (two) times daily. Every morning. (Patient taking differently: Take 100 mg by mouth 2 (two) times daily. ) 180 tablet 5  . traZODone (DESYREL) 50 MG tablet Take 1 tablet (50 mg total) by mouth at bedtime. (Patient not taking: Reported on 04/04/2018) 90 tablet 4   No current facility-administered medications for this visit.     Allergies as of 08/21/2018  . (No Known Allergies)    Vitals: LMP  (LMP Unknown)  Last Weight:  Wt Readings from Last 1 Encounters:  08/21/18 163 lb (73.9 kg)   Last Height:   Ht Readings from Last 1 Encounters:  08/21/18 5\' 7"  (1.702 m)    Prior examination:  Physical exam: Exam: Gen: NAD, conversant, well nourised, well groomed                     CV: RRR, no MRG. No Carotid Bruits. No peripheral edema, warm, nontender Eyes: Conjunctivae clear without exudates or hemorrhage  Neuro: Detailed Neurologic Exam  Speech:    Speech is normal; fluent and spontaneous with normal comprehension.  Cognition:    The patient is oriented to person, place, and time;     recent and remote memory intact;     language fluent;     normal attention, concentration,     fund of  knowledge Cranial Nerves:    The pupils are equal, round, and reactive to light. The fundi are normal and spontaneous venous pulsations are present. Visual fields are full to finger confrontation. Extraocular movements are intact. Trigeminal sensation is intact and the muscles of mastication are normal. The face is symmetric. The palate elevates in the midline. Hearing intact. Voice is normal. Shoulder shrug is normal. The tongue has normal motion without fasciculations.   Coordination:    Normal finger to nose and heel to shin. Normal rapid alternating movements.   Gait:    Heel-toe and tandem gait are normal.   Motor Observation:    No asymmetry, no atrophy, and no involuntary movements noted. Tone:    Normal muscle tone.    Posture:    Posture is normal. normal erect    Strength:    Strength is V/V in the upper and lower limbs.      Sensation: intact to LT     Reflex Exam:  DTR's:    Deep tendon reflexes in the upper and lower extremities are normal bilaterally.   Toes:    The toes are downgoing bilaterally.   Clonus:    Clonus is absent.       Assessment/Plan:  48 year old with chronic intractable headaches. Will try and get both botox and cgrp approved but if we cannot then she will stop the cgrp in favor of botox as botox is more beneficial.   To prevent or relieve headaches, try the following: Cool Compress. Lie down and place a cool compress on your head.  Avoid headache triggers. If certain foods or odors seem to have triggered your migraines in the past, avoid them. A headache diary might help you identify triggers.  Include physical activity in your daily routine. Try a daily walk or other moderate aerobic exercise.  Manage stress. Find healthy ways to cope with the stressors, such as delegating tasks on your to-do list.  Practice relaxation techniques. Try deep breathing, yoga, massage  and visualization.  Eat regularly. Eating regularly scheduled meals and  maintaining a healthy diet might help prevent headaches. Also, drink plenty of fluids.  Follow a regular sleep schedule. Sleep deprivation might contribute to headaches Consider biofeedback. With this mind-body technique, you learn to control certain bodily functions - such as muscle tension, heart rate and blood pressure - to prevent headaches or reduce headache pain.    Proceed to emergency room if you experience new or worsening symptoms or symptoms do not resolve, if you have new neurologic symptoms or if headache is severe, or for any concerning symptom.   Provided education and documentation from American headache Society toolbox including articles on: chronic migraine medication overuse headache, chronic migraines, prevention of migraines, behavioral and other nonpharmacologic treatments for headache.   All procedures a documented blood were medically necessary, reasonable and appropriate based on the patient's history, medical diagnosis and physician opinion. Verbal informed consent was obtained from the patient, patient was informed of potential risk of procedure, including bruising, bleeding, hematoma formation, infection, muscle weakness, muscle pain, numbness, transient hypertension, transient hyperglycemia and transient insomnia among others. All areas injected were topically clean with isopropyl rubbing alcohol. Nonsterile nonlatex gloves were worn during the procedure.  4. Supraorbital/trigeminal nerve block (64400): Supraorbital nerve site was identified along the incision of the frontal bone on the orbital/supraorbital ridge. Medication was injected into the left and right supraorbital nerve areas. Patient's condition is associated with inflammation of the supraorbital and associated muscle groups. Injection was deemed medically necessary, reasonable and appropriate. Injection represents a separate and unique surgical service.  Cc: Darrow Bussing, MD  Naomie Dean, MD  [facility] [address] [place], [place] [address]  Phone [phone] Fax [phone]-

## 2018-08-21 NOTE — Telephone Encounter (Signed)
Spoke with pt. Confirmed pt using 2 identifiers. I updated her chart. No changes to PMH. She is no longer on baclofen d/t s/e. Not taking Trazodone. She takes Aimovig every 30 days. Pt understands she will receive a call about 3:00 from staff to check in followed by Dr. Trevor Mace appt call at 3:30 PM. If Dr. Lucia Gaskins is able to earlier, she will call. Pt verbalized appreciation.

## 2018-08-24 ENCOUNTER — Encounter: Payer: Self-pay | Admitting: Neurology

## 2018-08-26 ENCOUNTER — Encounter: Payer: Self-pay | Admitting: Neurology

## 2018-09-14 ENCOUNTER — Encounter: Payer: Self-pay | Admitting: Neurology

## 2018-09-17 ENCOUNTER — Telehealth: Payer: Self-pay | Admitting: Neurology

## 2018-09-17 NOTE — Telephone Encounter (Signed)
I sent a letter to the patients insurance written by Dr. Lucia Gaskins to appeal the denial on her Botox determination. DW

## 2018-09-26 ENCOUNTER — Ambulatory Visit: Payer: PRIVATE HEALTH INSURANCE | Admitting: Psychiatry

## 2018-09-29 ENCOUNTER — Telehealth: Payer: Self-pay | Admitting: *Deleted

## 2018-09-29 NOTE — Telephone Encounter (Signed)
I spoke with the patient and scheduled her for Thurs 6/17 @ 2:30 pm arrival 15 minutes prior. Pt passed [place] screening questions. She will bring her own mask and is aware of the check-in process outside the front door.

## 2018-09-29 NOTE — Telephone Encounter (Signed)
-----   Message from Melvenia Beam, MD sent at 09/26/2018  5:06 PM EDT ----- Regarding: Botox Nancy Porter, would you schedule patient for an in-office appointment this week please? I am going to use some botox samples. thanks

## 2018-10-02 ENCOUNTER — Telehealth: Payer: Self-pay | Admitting: Neurology

## 2018-10-02 ENCOUNTER — Ambulatory Visit (INDEPENDENT_AMBULATORY_CARE_PROVIDER_SITE_OTHER): Payer: PRIVATE HEALTH INSURANCE | Admitting: Neurology

## 2018-10-02 DIAGNOSIS — G43711 Chronic migraine without aura, intractable, with status migrainosus: Secondary | ICD-10-CM

## 2018-10-02 MED ORDER — UBRELVY 50 MG PO TABS: 100.0000 mg | ORAL_TABLET | ORAL | 6 refills | Status: DC | PRN

## 2018-10-02 NOTE — Progress Notes (Signed)
Consent Form Botulism Toxin Injection For Chronic Migraine   Interval history 10/03/2018: Patient was doing extremely well on Boto and CGRP. Headaches worsened off of botox treatment. +masseters and temporals and temples +LS. She takes 200mg  topiramate daily. She does not want to go down on the Topiramate. She was feeling exceptionally better  >70% improvement in migraine frequency and severity and then we stopped botox due to insurance, she prefers botox will stop CGRPs. On Maxalt acutely. Baclofen and increased nortrip had side effects. Can try increasing nortrip alone or using propranolol in the future. She has lots of TMD/TMJ. Ordered dry needling for cervical myofascial pain syndrome in the past. Try Roselyn Meier for acute management.  Meds ordered this encounter  Medications  . Ubrogepant (UBRELVY) 50 MG TABS    Sig: Take 100 mg by mouth every 2 (two) hours as needed. Max 200mg  a day    Dispense:  10 tablet    Refill:  6    Patient has copay card; she can have medication for regardless of insurance approval or copay amount.    Reviewed orally with patient, additionally signature is on file:  Botulism toxin has been approved by the Federal drug administration for treatment of chronic migraine. Botulism toxin does not cure chronic migraine and it may not be effective in some patients.  The administration of botulism toxin is accomplished by injecting a small amount of toxin into the muscles of the neck and head. Dosage must be titrated for each individual. Any benefits resulting from botulism toxin tend to wear off after 3 months with a repeat injection required if benefit is to be maintained. Injections are usually done every 3-4 months with maximum effect peak achieved by about 2 or 3 weeks. Botulism toxin is expensive and you should be sure of what costs you will incur resulting from the injection.  The side effects of botulism toxin use for chronic migraine may include:   -Transient, and  usually mild, facial weakness with facial injections  -Transient, and usually mild, head or neck weakness with head/neck injections  -Reduction or loss of forehead facial animation due to forehead muscle weakness  -Eyelid drooping  -Dry eye  -Pain at the site of injection or bruising at the site of injection  -Double vision  -Potential unknown long term risks  Contraindications: You should not have Botox if you are pregnant, nursing, allergic to albumin, have an infection, skin condition, or muscle weakness at the site of the injection, or have myasthenia gravis, Lambert-Eaton syndrome, or ALS.  It is also possible that as with any injection, there may be an allergic reaction or no effect from the medication. Reduced effectiveness after repeated injections is sometimes seen and rarely infection at the injection site may occur. All care will be taken to prevent these side effects. If therapy is given over a long time, atrophy and wasting in the muscle injected may occur. Occasionally the patient's become refractory to treatment because they develop antibodies to the toxin. In this event, therapy needs to be modified.  I have read the above information and consent to the administration of botulism toxin.    BOTOX PROCEDURE NOTE FOR MIGRAINE HEADACHE    Contraindications and precautions discussed with patient(above). Aseptic procedure was observed and patient tolerated procedure. Procedure performed by Dr. Georgia Dom  The condition has existed for more than 6 months, and pt does not have a diagnosis of ALS, Myasthenia Gravis or Lambert-Eaton Syndrome.  Risks and benefits of injections  discussed and pt agrees to proceed with the procedure.  Written consent obtained  These injections are medically necessary. Pt  receives good benefits from these injections. These injections do not cause sedations or hallucinations which the oral therapies may cause.  Description of procedure:  The patient was  placed in a sitting position. The standard protocol was used for Botox as follows, with 5 units of Botox injected at each site:   -Procerus muscle, midline injection  -Corrugator muscle, bilateral injection  -Frontalis muscle, bilateral injection, with 2 sites each side, medial injection was performed in the upper one third of the frontalis muscle, in the region vertical from the medial inferior edge of the superior orbital rim. The lateral injection was again in the upper one third of the forehead vertically above the lateral limbus of the cornea, 1.5 cm lateral to the medial injection site.  - Levator Scapulae: 5 units bilaterally  -Temporalis muscle injection, 5 sites, bilaterally. The first injection was 3 cm above the tragus of the ear, second injection site was 1.5 cm to 3 cm up from the first injection site in line with the tragus of the ear. The third injection site was 1.5-3 cm forward between the first 2 injection sites. The fourth injection site was 1.5 cm posterior to the second injection site. 5th site laterally in the temporalis  muscleat the level of the outer canthus.  - Patient feels her clenching is a trigger for headaches. +5 units masseter bilaterally   - Patient feels the migraines are centered around the eyes +5 units bilaterally at the outer canthus in the orbicularis occuli  -Occipitalis muscle injection, 3 sites, bilaterally. The first injection was done one half way between the occipital protuberance and the tip of the mastoid process behind the ear. The second injection site was done lateral and superior to the first, 1 fingerbreadth from the first injection. The third injection site was 1 fingerbreadth superiorly and medially from the first injection site.  -Cervical paraspinal muscle injection, 2 sites, bilateral knee first injection site was 1 cm from the midline of the cervical spine, 3 cm inferior to the lower border of the occipital protuberance. The second  injection site was 1.5 cm superiorly and laterally to the first injection site.  -Trapezius muscle injection was performed at 3 sites, bilaterally. The first injection site was in the upper trapezius muscle halfway between the inflection point of the neck, and the acromion. The second injection site was one half way between the acromion and the first injection site. The third injection was done between the first injection site and the inflection point of the neck.   Will return for repeat injection in 3 months.   A 200 unit sof Botox was used, any Botox not injected was wasted. The patient tolerated the procedure well, there were no complications of the above procedure.  A total of 40 minutes was spent face-to-face with this patient. Over half this time was spent on counseling patient on the  1. Chronic migraine without aura, with intractable migraine, so stated, with status migrainosus    diagnosis and different diagnostic and therapeutic options, counseling and coordination of care, risks ans benefits of management, compliance, or risk factor reduction and education.

## 2018-10-02 NOTE — Telephone Encounter (Signed)
10/02/18 - 10 wk Botox per Dr. Jaynee Eagles

## 2018-10-02 NOTE — Patient Instructions (Signed)
Ubrogepant: Patient drug information Access [employer] here. Copyright 1978-2020 [employer] All rights reserved. (For additional information see "Ubrogepant: Drug information") Brand Names: [place]  Ubrelvy  What is this drug used for?   It is used to treat migraine headaches.  What do I need to tell my doctor BEFORE I take this drug?   If you are allergic to this drug; any part of this drug; or any other drugs, foods, or substances. Tell your doctor about the allergy and what signs you had.   If you have kidney disease.   If you take any drugs (prescription or OTC, natural products, vitamins) that must not be taken with this drug, like certain drugs that are used for HIV, infections, or seizures. There are many drugs that must not be taken with this drug.   This is not a list of all drugs or health problems that interact with this drug.   Tell your doctor and pharmacist about all of your drugs (prescription or OTC, natural products, vitamins) and health problems. You must check to make sure that it is safe for you to take this drug with all of your drugs and health problems. Do not start, stop, or change the dose of any drug without checking with your doctor.  What are some things I need to know or do while I take this drug?   Tell all of your health care providers that you take this drug. This includes your doctors, nurses, pharmacists, and dentists.   If you drink grapefruit juice or eat grapefruit often, talk with your doctor.   Tell your doctor if you are pregnant, plan on getting pregnant, or are breast-feeding. You will need to talk about the benefits and risks to you and the baby.  What are some side effects that I need to call my doctor about right away?   WARNING/CAUTION: Even though it may be rare, some people may have very bad and sometimes deadly side effects when taking a drug. Tell your doctor or get medical help right away if you have any of the following signs or symptoms  that may be related to a very bad side effect:   Signs of an allergic reaction, like rash; hives; itching; red, swollen, blistered, or peeling skin with or without fever; wheezing; tightness in the chest or throat; trouble breathing, swallowing, or talking; unusual hoarseness; or swelling of the mouth, face, lips, tongue, or throat.  What are some other side effects of this drug?   All drugs may cause side effects. However, many people have no side effects or only have minor side effects. Call your doctor or get medical help if any of these side effects or any other side effects bother you or do not go away:   Upset stomach.   Feeling sleepy.   These are not all of the side effects that may occur. If you have questions about side effects, call your doctor. Call your doctor for medical advice about side effects.   You may report side effects to your national health agency.  How is this drug best taken?   Use this drug as ordered by your doctor. Read all information given to you. Follow all instructions closely.   Take with or without food.   Take as early as you can after the attack has started.   If needed, another dose may be taken as your doctor has told you. Be sure you know how many hours to wait before taking another   dose.  What do I do if I miss a dose?   This drug is taken on an as needed basis. Do not take more often than told by the doctor.  How do I store and/or throw out this drug?   Store at room temperature in a dry place. Do not store in a bathroom.   Keep all drugs in a safe place. Keep all drugs out of the reach of children and pets.   Throw away unused or expired drugs. Do not flush down a toilet or pour down a drain unless you are told to do so. Check with your pharmacist if you have questions about the best way to throw out drugs. There may be drug take-back programs in your area.  General drug facts   If your symptoms or health problems do not get better or if they become  worse, call your doctor.   Do not share your drugs with others and do not take anyone else's drugs.   Some drugs may have another patient information leaflet. If you have any questions about this drug, please talk with your doctor, nurse, pharmacist, or other health care provider.   If you think there has been an overdose, call your poison control center or get medical care right away. Be ready to tell or show what was taken, how much, and when it happened.   

## 2018-10-02 NOTE — Progress Notes (Signed)
Xeomin (incobotulinumtoxinA)- 100 units x 2 vials Lot: [id number] Expiration: 12/2018 GTIN: [id number]  Bacteriostatic 0.9% Sodium Chloride- 64mL total Lot: [id number] Expiration: 01/15/2019 NDC: 8916-9450-38  Dx: U82.800 Samples

## 2018-10-03 ENCOUNTER — Encounter: Payer: Self-pay | Admitting: Neurology

## 2018-10-06 NOTE — Telephone Encounter (Signed)
Spoke with both pt & infusion RN. Pt will come tomorrow AM at 10:00 for infusion. She passed the [place] screening questions and will bring her own mask. She understands the medications to be given include Depacon, Steroids, Toradol, nausea medication that can make her sleepy and cause leg jitteriness. Pt will have a driver. She confirmed she has NKA. She stated she did not repeat the Ubrelvy today because she was drowsy after the first dose. She verbalized appreciation for the call.   Orders written & signed by Dr. Jaynee Eagles for the following: Depacon 1 gram IV x 1  Solumedrol 250 mg IV x 1  Toradol 30 mg IV x 1  Compazine 10 mg IV x 1  Orders and insurance info taken to [employer] in infusion suite.

## 2018-10-07 NOTE — Telephone Encounter (Signed)
10 weeks for her ill use samples again thanks.

## 2018-10-09 NOTE — Telephone Encounter (Signed)
I called and scheduled the patient for her injection.  °

## 2018-11-20 ENCOUNTER — Other Ambulatory Visit: Payer: Self-pay | Admitting: Obstetrics and Gynecology

## 2018-11-20 DIAGNOSIS — Z1231 Encounter for screening mammogram for malignant neoplasm of breast: Secondary | ICD-10-CM

## 2018-11-20 DIAGNOSIS — E2839 Other primary ovarian failure: Secondary | ICD-10-CM

## 2018-12-04 ENCOUNTER — Encounter: Payer: Self-pay | Admitting: Cardiovascular Disease

## 2018-12-09 ENCOUNTER — Other Ambulatory Visit: Payer: Self-pay | Admitting: Family Medicine

## 2018-12-09 DIAGNOSIS — R05 Cough: Secondary | ICD-10-CM

## 2018-12-09 DIAGNOSIS — R1030 Lower abdominal pain, unspecified: Secondary | ICD-10-CM

## 2018-12-09 DIAGNOSIS — Z720 Tobacco use: Secondary | ICD-10-CM

## 2018-12-09 DIAGNOSIS — R059 Cough, unspecified: Secondary | ICD-10-CM

## 2018-12-10 ENCOUNTER — Telehealth: Payer: Self-pay | Admitting: *Deleted

## 2018-12-10 NOTE — Telephone Encounter (Signed)
Completed Roselyn Meier PA on CMM>. KEY: Key: [id number]. Awaiting Training and development officer.

## 2018-12-15 NOTE — Telephone Encounter (Signed)
Received paper PA and request for chart notes regarding Ubrelvy. PA completed, signed by MD and faxed to [place] [facility] along with the last 2 chart notes which include history of migraine treatments. Received a receipt of confirmation.

## 2018-12-16 ENCOUNTER — Other Ambulatory Visit: Payer: Self-pay

## 2018-12-16 ENCOUNTER — Ambulatory Visit (INDEPENDENT_AMBULATORY_CARE_PROVIDER_SITE_OTHER): Payer: PRIVATE HEALTH INSURANCE | Admitting: Neurology

## 2018-12-16 VITALS — Temp 98.0°F

## 2018-12-16 DIAGNOSIS — G43711 Chronic migraine without aura, intractable, with status migrainosus: Secondary | ICD-10-CM

## 2018-12-16 NOTE — Progress Notes (Signed)
Consent Form Botulism Toxin Injection For Chronic Migraine   Interval history 10/03/2018: Patient was doing extremely well on Boto and CGRP. Headaches worsened off of botox treatment. +masseters and temporals and temples +LS. She takes 200mg  topiramate daily. She does not want to go down on the Topiramate. She was feeling exceptionally better  >70% improvement in migraine frequency and severity and then we stopped botox due to insurance, she prefers botox will stop CGRPs. On Maxalt acutely. Baclofen and increased nortrip had side effects. Can try increasing nortrip alone or using propranolol in the future. She has lots of TMD/TMJ. Ordered dry needling for cervical myofascial pain syndrome in the past. Try Roselyn Meier for acute management.  No orders of the defined types were placed in this encounter.   Reviewed orally with patient, additionally signature is on file:  Botulism toxin has been approved by the Federal drug administration for treatment of chronic migraine. Botulism toxin does not cure chronic migraine and it may not be effective in some patients.  The administration of botulism toxin is accomplished by injecting a small amount of toxin into the muscles of the neck and head. Dosage must be titrated for each individual. Any benefits resulting from botulism toxin tend to wear off after 3 months with a repeat injection required if benefit is to be maintained. Injections are usually done every 3-4 months with maximum effect peak achieved by about 2 or 3 weeks. Botulism toxin is expensive and you should be sure of what costs you will incur resulting from the injection.  The side effects of botulism toxin use for chronic migraine may include:   -Transient, and usually mild, facial weakness with facial injections  -Transient, and usually mild, head or neck weakness with head/neck injections  -Reduction or loss of forehead facial animation due to forehead muscle weakness  -Eyelid drooping  -Dry eye  -Pain at the site of injection or bruising at the site of injection  -Double vision  -Potential unknown long term risks  Contraindications: You should not have Botox if you are pregnant, nursing, allergic to albumin, have an infection, skin condition, or muscle weakness at the site of the injection, or have myasthenia gravis, Lambert-Eaton syndrome, or ALS.  It is also possible that as with any injection, there may be an allergic reaction or no effect from the medication. Reduced effectiveness after repeated injections is sometimes seen and rarely infection at the injection site may occur. All care will be taken to prevent these side effects. If therapy is given over a long time, atrophy and wasting in the muscle injected may occur. Occasionally the patient's become refractory to treatment because they develop antibodies to the toxin. In this event, therapy needs to be modified.  I have read the above information and consent to the administration of botulism toxin.    BOTOX PROCEDURE NOTE FOR MIGRAINE HEADACHE    Contraindications and precautions discussed with patient(above). Aseptic procedure was observed and patient tolerated procedure. Procedure performed by Dr. Georgia Dom  The condition has existed for more than 6 months, and pt does not have a diagnosis of ALS, Myasthenia Gravis or Lambert-Eaton Syndrome.  Risks and benefits of injections discussed and pt agrees to proceed with the procedure.  Written consent obtained  These injections are medically necessary. Pt  receives good benefits from these injections. These injections do not cause sedations or hallucinations which the oral therapies may cause.  Description of procedure:  The patient was placed in a sitting position. The  standard protocol was used for Botox as follows, with 5 units of Botox injected at each site:   -Procerus muscle, midline injection  -Corrugator muscle, bilateral injection  -Frontalis muscle,  bilateral injection, with 2 sites each side, medial injection was performed in the upper one third of the frontalis muscle, in the region vertical from the medial inferior edge of the superior orbital rim. The lateral injection was again in the upper one third of the forehead vertically above the lateral limbus of the cornea, 1.5 cm lateral to the medial injection site.  - Levator Scapulae: 5 units bilaterally  -Temporalis muscle injection, 5 sites, bilaterally. The first injection was 3 cm above the tragus of the ear, second injection site was 1.5 cm to 3 cm up from the first injection site in line with the tragus of the ear. The third injection site was 1.5-3 cm forward between the first 2 injection sites. The fourth injection site was 1.5 cm posterior to the second injection site. 5th site laterally in the temporalis  muscleat the level of the outer canthus.  - Patient feels her clenching is a trigger for headaches. +5 units masseter bilaterally   - Patient feels the migraines are centered around the eyes +5 units bilaterally at the outer canthus in the orbicularis occuli  -Occipitalis muscle injection, 3 sites, bilaterally. The first injection was done one half way between the occipital protuberance and the tip of the mastoid process behind the ear. The second injection site was done lateral and superior to the first, 1 fingerbreadth from the first injection. The third injection site was 1 fingerbreadth superiorly and medially from the first injection site.  -Cervical paraspinal muscle injection, 2 sites, bilateral knee first injection site was 1 cm from the midline of the cervical spine, 3 cm inferior to the lower border of the occipital protuberance. The second injection site was 1.5 cm superiorly and laterally to the first injection site.  -Trapezius muscle injection was performed at 3 sites, bilaterally. The first injection site was in the upper trapezius muscle halfway between the inflection  point of the neck, and the acromion. The second injection site was one half way between the acromion and the first injection site. The third injection was done between the first injection site and the inflection point of the neck.   Will return for repeat injection in 3 months.   A 200 unit sof Botox was used, any Botox not injected was wasted. The patient tolerated the procedure well, there were no complications of the above procedure.  A total of 25 minutes was spent face-to-face with this patient. Over half this time was spent on counseling patient on the  No diagnosis found. diagnosis and different diagnostic and therapeutic options, counseling and coordination of care, risks ans benefits of management, compliance, or risk factor reduction and education.

## 2018-12-16 NOTE — Progress Notes (Signed)
Botox- 200 units x 1 vial Lot: [id number] Expiration: 02/2020  Bacteriostatic 0.9% Sodium Chloride- 91mL total Lot: [id number] Expiration: 01/15/2019 NDC: 2336-1224-49  Dx: P53.005 samples

## 2018-12-17 ENCOUNTER — Ambulatory Visit
Admission: RE | Admit: 2018-12-17 | Discharge: 2018-12-17 | Disposition: A | Discharge status: Home / Self Care | Payer: PRIVATE HEALTH INSURANCE | Source: Ambulatory Visit | Attending: Family Medicine | Admitting: Family Medicine

## 2018-12-17 ENCOUNTER — Encounter: Payer: Self-pay | Admitting: *Deleted

## 2018-12-17 ENCOUNTER — Other Ambulatory Visit: Payer: Self-pay

## 2018-12-17 DIAGNOSIS — R059 Cough, unspecified: Secondary | ICD-10-CM

## 2018-12-17 DIAGNOSIS — Z720 Tobacco use: Secondary | ICD-10-CM

## 2018-12-17 DIAGNOSIS — R05 Cough: Secondary | ICD-10-CM

## 2018-12-17 DIAGNOSIS — R1030 Lower abdominal pain, unspecified: Secondary | ICD-10-CM

## 2018-12-17 MED ORDER — IOPAMIDOL (ISOVUE-300) INJECTION 61%
100.0000 mL | Freq: Once | INTRAVENOUS | Status: AC | PRN
  Administered 2018-12-17: 10:00:00 100 mL via INTRAVENOUS

## 2018-12-17 NOTE — Telephone Encounter (Signed)
Received approval from [place] [facility]. Ubrelvy approved from 12/16/2018 through 06/15/2019. I faxed a copy of the approval to pt's pharmacy. Received a receipt of confirmation. Sent pt mychart message with update.

## 2019-01-08 ENCOUNTER — Encounter: Payer: Self-pay | Admitting: Critical Care Medicine

## 2019-01-20 ENCOUNTER — Institutional Professional Consult (permissible substitution): Payer: PRIVATE HEALTH INSURANCE | Admitting: Critical Care Medicine

## 2019-01-20 NOTE — Progress Notes (Deleted)
Synopsis: Referred in October 2020 for cough by Lujean Amel, MD  Subjective:   PATIENT ID: Nancy Porter GENDER: female DOB: 1970-06-23, MRN: [id number]  No chief complaint on file.   HPI Ongoing tobacco use   Referred for ? Abnormal CT scan?   Past Medical History:  Diagnosis Date  . Anxiety   . Depression   . Deviated septum   . Headache   . Hx of suicide attempt 2010   overdose on Trileptal, has been on Seroquel in the past  . Hyperlipidemia   . Tobacco use      Family History  Problem Relation Age of Onset  . Glaucoma Mother   . Heart disease Mother   . Deep vein thrombosis Father   . Migraines Sister   . Stroke Maternal Grandmother   . Aneurysm Maternal Grandmother   . Heart disease Maternal Grandmother   . Diabetes Paternal Uncle        adult onset     Past Surgical History:  Procedure Laterality Date  . ABDOMINAL HYSTERECTOMY    . BREAST ENHANCEMENT SURGERY    . CESAREAN SECTION     x 2  . SHOULDER SURGERY Left    impingement    Social History   Socioeconomic History  . Marital status: Married    Spouse name: Eddie Dibbles  . Number of children: 2  . Years of education: some college  . Highest education level: Some college, no degree  Occupational History  . Occupation: Office manager: [employer]  Social Needs  . Financial resource strain: Not on file  . Food insecurity    Worry: Not on file    Inability: Not on file  . Transportation needs    Medical: Not on file    Non-medical: Not on file  Tobacco Use  . Smoking status: Current Every Day Smoker    Packs/day: 1.00    Years: 32.00    Pack years: 32.00    Types: Cigarettes  . Smokeless tobacco: Never Used  Substance and Sexual Activity  . Alcohol use: Yes    Comment: occ maybe 1-2 a month  . Drug use: No    Comment: cocaine in the past  . Sexual activity: Not on file  Lifestyle  . Physical activity    Days per week: Not on file    Minutes per  session: Not on file  . Stress: Not on file  Relationships  . Social Herbalist on phone: Not on file    Gets together: Not on file    Attends religious service: Not on file    Active member of club or organization: Not on file    Attends meetings of clubs or organizations: Not on file    Relationship status: Not on file  . Intimate partner violence    Fear of current or ex partner: Not on file    Emotionally abused: Not on file    Physically abused: Not on file    Forced sexual activity: Not on file  Other Topics Concern  . Not on file  Social History Narrative   Patient is left-handed. She lives with her husband and daughter in a 1 story house.  She has not been exercising in the last year.    3 cups coffee daily.     No Known Allergies    There is no immunization history on file for this patient.  Outpatient Medications Prior  to Visit  Medication Sig Dispense Refill  . nortriptyline (PAMELOR) 25 MG capsule Take 1 capsule (25 mg total) by mouth at bedtime. 90 capsule 3  . prochlorperazine (COMPAZINE) 10 MG tablet Take 10 mg by mouth as needed for nausea or vomiting.    . topiramate (TOPAMAX) 100 MG tablet Take 1 tablet (100 mg total) by mouth 2 (two) times daily. Every morning. (Patient taking differently: Take 100 mg by mouth 2 (two) times daily. ) 180 tablet 5  . traZODone (DESYREL) 50 MG tablet Take 1 tablet (50 mg total) by mouth at bedtime. (Patient not taking: Reported on 04/04/2018) 90 tablet 4  . Ubrogepant (UBRELVY) 50 MG TABS Take 100 mg by mouth every 2 (two) hours as needed. Max 200mg  a day 10 tablet 6   No facility-administered medications prior to visit.     ROS   Objective:  There were no vitals filed for this visit.   on *** LPM *** RA BMI Readings from Last 3 Encounters:  08/21/18 25.53 kg/m  12/30/17 25.37 kg/m  11/19/17 26.00 kg/m   Wt Readings from Last 3 Encounters:  08/21/18 163 lb (73.9 kg)  12/30/17 162 lb (73.5 kg)  11/19/17  166 lb (75.3 kg)    Physical Exam   CBC    Component Value Date/Time   WBC 11.5 (H) 05/16/2017 1943   RBC 4.72 05/16/2017 1943   HGB 14.6 05/16/2017 1943   HCT 43.6 05/16/2017 1943   PLT 233 05/16/2017 1943   MCV 92.5 05/16/2017 1943   MCH 30.9 05/16/2017 1943   MCHC 33.4 05/16/2017 1943   RDW 13.3 05/16/2017 1943    CHEMISTRY No results for input(s): NA, K, CL, CO2, GLUCOSE, BUN, CREATININE, CALCIUM, MG, PHOS in the last 168 hours. CrCl cannot be calculated (Patient's most recent lab result is older than the maximum 21 days allowed.).   Chest Imaging- films reviewed: CT chest 12/17/2018-  inferior left lower lobe subpleural nodule.  Tiny centrilobular nodules throughout without architectural distortion.  Airway thickening.  Generous left atrium and ascending aorta.   Pulmonary Functions Testing Results: No flowsheet data found.      Assessment & Plan:   No diagnosis found.  Chronic cough and evidence of small airway inflammation on CT scan, likely smoking related   Current Outpatient Medications:  .  nortriptyline (PAMELOR) 25 MG capsule, Take 1 capsule (25 mg total) by mouth at bedtime., Disp: 90 capsule, Rfl: 3 .  prochlorperazine (COMPAZINE) 10 MG tablet, Take 10 mg by mouth as needed for nausea or vomiting., Disp: , Rfl:  .  topiramate (TOPAMAX) 100 MG tablet, Take 1 tablet (100 mg total) by mouth 2 (two) times daily. Every morning. (Patient taking differently: Take 100 mg by mouth 2 (two) times daily. ), Disp: 180 tablet, Rfl: 5 .  traZODone (DESYREL) 50 MG tablet, Take 1 tablet (50 mg total) by mouth at bedtime. (Patient not taking: Reported on 04/04/2018), Disp: 90 tablet, Rfl: 4 .  Ubrogepant (UBRELVY) 50 MG TABS, Take 100 mg by mouth every 2 (two) hours as needed. Max 200mg  a day, Disp: 10 tablet, Rfl: 6   04/06/2018, DO [facility] 01/20/2019 8:47 AM

## 2019-01-22 ENCOUNTER — Ambulatory Visit (INDEPENDENT_AMBULATORY_CARE_PROVIDER_SITE_OTHER): Payer: PRIVATE HEALTH INSURANCE | Admitting: Critical Care Medicine

## 2019-01-22 ENCOUNTER — Other Ambulatory Visit: Payer: Self-pay

## 2019-01-22 ENCOUNTER — Encounter: Payer: Self-pay | Admitting: Critical Care Medicine

## 2019-01-22 VITALS — BP 116/82 | HR 61 | Temp 97.3°F | Ht 67.0 in | Wt 157.0 lb

## 2019-01-22 DIAGNOSIS — R9389 Abnormal findings on diagnostic imaging of other specified body structures: Secondary | ICD-10-CM | POA: Diagnosis not present

## 2019-01-22 DIAGNOSIS — Z23 Encounter for immunization: Secondary | ICD-10-CM

## 2019-01-22 DIAGNOSIS — R05 Cough: Secondary | ICD-10-CM | POA: Diagnosis not present

## 2019-01-22 DIAGNOSIS — R053 Chronic cough: Secondary | ICD-10-CM

## 2019-01-22 DIAGNOSIS — Z720 Tobacco use: Secondary | ICD-10-CM | POA: Diagnosis not present

## 2019-01-22 MED ORDER — BENZONATATE 100 MG PO CAPS: 100.0000 mg | ORAL_CAPSULE | Freq: Three times a day (TID) | ORAL | 0 refills | Status: DC | PRN

## 2019-01-22 MED ORDER — FLUTICASONE PROPIONATE 50 MCG/ACT NA SUSP: 2.0000 | Freq: Every day | NASAL | 2 refills | Status: DC

## 2019-01-22 MED ORDER — ALBUTEROL SULFATE HFA 108 (90 BASE) MCG/ACT IN AERS: 2.0000 | INHALATION_SPRAY | Freq: Four times a day (QID) | RESPIRATORY_TRACT | 11 refills | Status: DC | PRN

## 2019-01-22 NOTE — Patient Instructions (Addendum)
Thank you for visiting Dr. Carlis Abbott at [facility]. We recommend the following: Orders Placed This Encounter  Procedures  . Pulmonary function test   Orders Placed This Encounter  Procedures  . Pulmonary function test    Standing Status:   Future    Standing Expiration Date:   01/22/2020    Order Specific Question:   Where should this test be performed?    Answer:   [facility]    Order Specific Question:   Full PFT: includes the following: basic spirometry, spirometry pre & post bronchodilator, diffusion capacity (DLCO), lung volumes    Answer:   Full PFT    Meds ordered this encounter  Medications  . benzonatate (TESSALON) 100 MG capsule    Sig: Take 1 capsule (100 mg total) by mouth 3 (three) times daily as needed for cough.    Dispense:  90 capsule    Refill:  0  . albuterol (VENTOLIN HFA) 108 (90 Base) MCG/ACT inhaler    Sig: Inhale 2 puffs into the lungs every 6 (six) hours as needed for wheezing or shortness of breath.    Dispense:  6.7 g    Refill:  11  . fluticasone (FLONASE) 50 MCG/ACT nasal spray    Sig: Place 2 sprays into both nostrils daily.    Dispense:  16 g    Refill:  2    Return in about 3 months (around 04/24/2019).    Please do your part to reduce the spread of COVID-19.

## 2019-01-22 NOTE — Progress Notes (Signed)
   Subjective:    Patient ID: Nancy Porter, female    DOB: Jan 06, 1971, 48 y.o.   MRN: [id number]  HPI    Review of Systems  Constitutional: Negative for fever and unexpected weight change.  HENT: Positive for congestion. Negative for dental problem, ear pain, nosebleeds, postnasal drip, rhinorrhea, sinus pressure, sneezing, sore throat and trouble swallowing.   Eyes: Negative for redness and itching.  Respiratory: Positive for cough and shortness of breath. Negative for chest tightness and wheezing.   Cardiovascular: Negative for palpitations and leg swelling.  Gastrointestinal: Negative for nausea and vomiting.  Genitourinary: Negative for dysuria.  Musculoskeletal: Negative for joint swelling.  Skin: Negative for rash.  Allergic/Immunologic: Negative.  Negative for environmental allergies, food allergies and immunocompromised state.  Neurological: Negative for headaches.  Hematological: Does not bruise/bleed easily.  Psychiatric/Behavioral: Negative for dysphoric mood. The patient is not nervous/anxious.        Objective:   Physical Exam        Assessment & Plan:

## 2019-01-22 NOTE — Progress Notes (Signed)
Synopsis: Referred in October 2020 for abnormal CT by Darrow Bussing, MD.  Subjective:   PATIENT ID: Nancy Porter DOB: 1970/10/16, MRN: [id number]  Chief Complaint  Patient presents with  . Consult    Referred by PCP Dr. Docia Chuck from [place] for abnormal CT on 12/17/18 and chronic cough. Semi-productive cough with thick, white mucus. Has been on several rounds of abx and prednisone without relief.     Nancy Porter is a 48 year old woman referred for evaluation of abnormal chest CT and chronic cough.  She has been coughing since March 2020, and it has worsened over time.  Her cough improved somewhat when she took courses of steroids and antibiotics, but it flared up again afterwards.  She has periods of intractable coughing.  She has sputum they can vary from clear to gray, sometimes with black specks in it.  She denies hemoptysis.  She has no history of heartburn, allergies, rhinorrhea, postnasal drip, hoarseness.  She has a 35-pack-year smoking history, but quit about 2 and half weeks ago.  Since quitting her cough is improved slightly.  She was started on Breo about a month ago but has not noticed much difference.  She has been on albuterol for a few months, but uses it infrequently.  She has no significant history of using electronic cigarettes.  She is not on an ACE inhibitor.  She does a lot of crafts, and is around dust from wood, but denies ceramics dust.  She has no family history of lung disease.  The CT scan of her chest was performed to follow-up and abnormal CT of her abdomen, which was performed to evaluate right lower quadrant abdominal pain.          Past Medical History:  Diagnosis Date  . Anxiety   . Depression   . Deviated septum   . Hx of migraines   . Hx of suicide attempt 2010   overdose on Trileptal, has been on Seroquel in the past  . Hyperlipidemia   . Tobacco use      Family History  Problem Relation Age of Onset  . Glaucoma Mother    . Heart disease Mother   . Deep vein thrombosis Father   . Migraines Sister   . Stroke Maternal Grandmother   . Aneurysm Maternal Grandmother   . Heart disease Maternal Grandmother   . Diabetes Paternal Uncle        adult onset  . CAD Maternal Grandfather      Past Surgical History:  Procedure Laterality Date  . ABDOMINAL HYSTERECTOMY    . BREAST ENHANCEMENT SURGERY    . CESAREAN SECTION     x 2  . SHOULDER SURGERY Left    impingement    Social History   Socioeconomic History  . Marital status: Married    Spouse name: Renae Fickle  . Number of children: 2  . Years of education: some college  . Highest education level: Some college, no degree  Occupational History  . Occupation: Scientist, physiological: [employer]  Social Needs  . Financial resource strain: Not on file  . Food insecurity    Worry: Not on file    Inability: Not on file  . Transportation needs    Medical: Not on file    Non-medical: Not on file  Tobacco Use  . Smoking status: Former Smoker    Packs/day: 1.00    Years: 35.00    Pack years: 35.00  Types: Cigarettes    Quit date: 01/08/2019    Years since quitting: 0.0  . Smokeless tobacco: Never Used  Substance and Sexual Activity  . Alcohol use: Yes    Comment: occ maybe 1-2 a month  . Drug use: No    Comment: cocaine in the past  . Sexual activity: Not on file  Lifestyle  . Physical activity    Days per week: Not on file    Minutes per session: Not on file  . Stress: Not on file  Relationships  . Social Musicianconnections    Talks on phone: Not on file    Gets together: Not on file    Attends religious service: Not on file    Active member of club or organization: Not on file    Attends meetings of clubs or organizations: Not on file    Relationship status: Not on file  . Intimate partner violence    Fear of current or ex partner: Not on file    Emotionally abused: Not on file    Physically abused: Not on file    Forced  sexual activity: Not on file  Other Topics Concern  . Not on file  Social History Narrative   Patient is left-handed. She lives with her husband and daughter in a 1 story house.  She has not been exercising in the last year.    3 cups coffee daily.     No Known Allergies   Immunization History  Administered Date(s) Administered  . Influenza,inj,Quad PF,6+ Mos 01/22/2019    Outpatient Medications Prior to Visit  Medication Sig Dispense Refill  . AIMOVIG 140 MG/ML SOAJ INJECT 140 MG INTO THE SKIN EVERY 30 (THIRTY) DAYS.    Marland Kitchen. BREO ELLIPTA 100-25 MCG/INH AEPB TAKE 1 PUFF BY MOUTH EVERY DAY    . gabapentin (NEURONTIN) 100 MG capsule Take 100 mg by mouth 2 (two) times daily.    . nortriptyline (PAMELOR) 25 MG capsule Take 1 capsule (25 mg total) by mouth at bedtime. 90 capsule 3  . oxybutynin (DITROPAN) 5 MG tablet TAKE 1 TABLET BY MOUTH EVERY 6 TO 8 HOURS AS NEEDED FOR URINARY URGENCY/FREQUENCY    . sertraline (ZOLOFT) 100 MG tablet TAKE 1.5 TABLETS BY MOUTH AT BEDTIME FOR ANXIETY    . topiramate (TOPAMAX) 100 MG tablet Take 1 tablet (100 mg total) by mouth 2 (two) times daily. Every morning. (Patient taking differently: Take 100 mg by mouth 2 (two) times daily. ) 180 tablet 5  . Ubrogepant (UBRELVY) 50 MG TABS Take 100 mg by mouth every 2 (two) hours as needed. Max 200mg  a day 10 tablet 6  . prochlorperazine (COMPAZINE) 10 MG tablet Take 10 mg by mouth as needed for nausea or vomiting.    . traZODone (DESYREL) 50 MG tablet Take 1 tablet (50 mg total) by mouth at bedtime. (Patient not taking: Reported on 04/04/2018) 90 tablet 4   No facility-administered medications prior to visit.     Review of Systems  Constitutional: Negative for chills, fever and weight loss.  HENT: Negative.   Respiratory: Positive for cough and sputum production. Negative for hemoptysis, shortness of breath and wheezing.   Cardiovascular: Negative for chest pain and leg swelling.  Gastrointestinal: Positive for  constipation. Negative for heartburn, nausea and vomiting.  Genitourinary: Negative.   Musculoskeletal: Negative for joint pain and myalgias.  Skin: Negative for rash.  Neurological: Negative for dizziness and focal weakness.       Chronic migraines  Endo/Heme/Allergies: Negative for environmental allergies. Does not bruise/bleed easily.  Psychiatric/Behavioral: Negative.      Objective:   Vitals:   01/22/19 1549  BP: 116/82  Pulse: 61  Temp: (!) 97.3 F (36.3 C)  TempSrc: Temporal  SpO2: 100%  Weight: 157 lb (71.2 kg)  Height: 5\' 7"  (1.702 m)   100% on   RA BMI Readings from Last 3 Encounters:  01/22/19 24.59 kg/m  08/21/18 25.53 kg/m  12/30/17 25.37 kg/m   Wt Readings from Last 3 Encounters:  01/22/19 157 lb (71.2 kg)  08/21/18 163 lb (73.9 kg)  12/30/17 162 lb (73.5 kg)    Physical Exam Vitals signs reviewed.  Constitutional:      General: She is not in acute distress.    Appearance: She is not ill-appearing.  HENT:     Head: Normocephalic and atraumatic.     Nose:     Comments: Deferred due to masking requirement.    Mouth/Throat:     Comments: Hoarse voice.  Further exam deferred due to masking requirement. Eyes:     General: No scleral icterus. Neck:     Musculoskeletal: Neck supple.  Cardiovascular:     Rate and Rhythm: Normal rate and regular rhythm.     Heart sounds: No murmur.  Pulmonary:     Comments: Breathing comfortably on room air, no tachypnea or conversational dyspnea.  No witnessed coughing.  Clear to auscultation bilaterally. Abdominal:     General: There is no distension.     Palpations: Abdomen is soft.     Tenderness: There is no abdominal tenderness.  Musculoskeletal:        General: No swelling or deformity.  Lymphadenopathy:     Cervical: No cervical adenopathy.  Skin:    General: Skin is warm and dry.     Findings: No rash.     Comments: Multiple tattoos  Neurological:     Mental Status: She is alert.     Motor: No  weakness.     Coordination: Coordination normal.  Psychiatric:        Mood and Affect: Mood normal.        Behavior: Behavior normal.      CBC    Component Value Date/Time   WBC 11.5 (H) 05/16/2017 1943   RBC 4.72 05/16/2017 1943   HGB 14.6 05/16/2017 1943   HCT 43.6 05/16/2017 1943   PLT 233 05/16/2017 1943   MCV 92.5 05/16/2017 1943   MCH 30.9 05/16/2017 1943   MCHC 33.4 05/16/2017 1943   RDW 13.3 05/16/2017 1943     Chest Imaging- films reviewed: CT chest 12/17/2018- no pulmonary embolism, no mediastinal or hilar adenopathy.  Minimally dilated distal esophagus.  Generous pulmonary artery, left atrium, ascending aorta. Mosaicism, airway thickening, peripheral tiny centrilobular nodules, few small intraparenchymal cysts.  Pulmonary Functions Testing Results: No flowsheet data found.     Assessment & Plan:     ICD-10-CM   1. Abnormal CT of the chest  R93.89 Pulmonary function test  2. Tobacco abuse  Z72.0 albuterol (VENTOLIN HFA) 108 (90 Base) MCG/ACT inhaler  3. Chronic cough  R05 Pulmonary function test    benzonatate (TESSALON) 100 MG capsule    albuterol (VENTOLIN HFA) 108 (90 Base) MCG/ACT inhaler    fluticasone (FLONASE) 50 MCG/ACT nasal spray  4. Need for immunization against influenza  Z23 Flu Vaccine QUAD 36+ mos IM   Chronic cough- likely multifactorial due to upper airway irritation, history of tobacco abuse, and likely  postnasal drip that she has not recognized.  I suspect she has a degree of COPD. -Agree with quitting smoking; congratulated her on her success so far - Mucinex twice daily -Flonase 2 sprays bilaterally once daily; she should use this until she follows up -Continue Breo - PFTs -Flu shot today -Tessalon 3 times daily as needed to help with symptoms for the next few weeks.  This is not meant to be a long-term medication, which was explained to the patient.  History tobacco abuse -Congratulated her on her success so far - Reinforced the  importance of smoking cessation in the risk of ongoing tobacco use -Can discuss low-dose CT for lung cancer screening at age 40  Abnormal chest CT- referral centrilobular nodules due to mucous plugging of small airways -No additional follow-up required -Smoking cessation  RTC in about 2 months.   Current Outpatient Medications:  .  AIMOVIG 140 MG/ML SOAJ, INJECT 140 MG INTO THE SKIN EVERY 30 (THIRTY) DAYS., Disp: , Rfl:  .  BREO ELLIPTA 100-25 MCG/INH AEPB, TAKE 1 PUFF BY MOUTH EVERY DAY, Disp: , Rfl:  .  gabapentin (NEURONTIN) 100 MG capsule, Take 100 mg by mouth 2 (two) times daily., Disp: , Rfl:  .  nortriptyline (PAMELOR) 25 MG capsule, Take 1 capsule (25 mg total) by mouth at bedtime., Disp: 90 capsule, Rfl: 3 .  oxybutynin (DITROPAN) 5 MG tablet, TAKE 1 TABLET BY MOUTH EVERY 6 TO 8 HOURS AS NEEDED FOR URINARY URGENCY/FREQUENCY, Disp: , Rfl:  .  sertraline (ZOLOFT) 100 MG tablet, TAKE 1.5 TABLETS BY MOUTH AT BEDTIME FOR ANXIETY, Disp: , Rfl:  .  topiramate (TOPAMAX) 100 MG tablet, Take 1 tablet (100 mg total) by mouth 2 (two) times daily. Every morning. (Patient taking differently: Take 100 mg by mouth 2 (two) times daily. ), Disp: 180 tablet, Rfl: 5 .  Ubrogepant (UBRELVY) 50 MG TABS, Take 100 mg by mouth every 2 (two) hours as needed. Max 200mg  a day, Disp: 10 tablet, Rfl: 6 .  albuterol (VENTOLIN HFA) 108 (90 Base) MCG/ACT inhaler, Inhale 2 puffs into the lungs every 6 (six) hours as needed for wheezing or shortness of breath., Disp: 6.7 g, Rfl: 11 .  benzonatate (TESSALON) 100 MG capsule, Take 1 capsule (100 mg total) by mouth 3 (three) times daily as needed for cough., Disp: 90 capsule, Rfl: 0 .  fluticasone (FLONASE) 50 MCG/ACT nasal spray, Place 2 sprays into both nostrils daily., Disp: 16 g, Rfl: 2   , DO [facility] 01/22/2019 5:11 PM

## 2019-01-24 ENCOUNTER — Other Ambulatory Visit: Payer: Self-pay | Admitting: Neurology

## 2019-01-25 ENCOUNTER — Other Ambulatory Visit: Payer: Self-pay | Admitting: Neurology

## 2019-01-27 ENCOUNTER — Telehealth: Payer: Self-pay

## 2019-01-27 NOTE — Telephone Encounter (Signed)
Aimovig PA done on cover my meds. Decision pending. 

## 2019-01-28 ENCOUNTER — Encounter: Payer: Self-pay | Admitting: *Deleted

## 2019-01-29 NOTE — Telephone Encounter (Signed)
Received fax from [place] [facility] requesting additional information [id number] PA. I called patient to discuss. She stated to disregard PA; [facility] ran Rx incorrectly. She went to [facility], and they used card. She will pick it up tomorrow. Notified Development worker, community.

## 2019-01-30 ENCOUNTER — Other Ambulatory Visit: Payer: PRIVATE HEALTH INSURANCE

## 2019-01-30 ENCOUNTER — Ambulatory Visit: Payer: PRIVATE HEALTH INSURANCE

## 2019-02-03 ENCOUNTER — Other Ambulatory Visit: Payer: Self-pay

## 2019-02-03 ENCOUNTER — Encounter: Payer: Self-pay | Admitting: *Deleted

## 2019-02-03 ENCOUNTER — Ambulatory Visit: Payer: PRIVATE HEALTH INSURANCE | Admitting: *Deleted

## 2019-02-03 ENCOUNTER — Ambulatory Visit (INDEPENDENT_AMBULATORY_CARE_PROVIDER_SITE_OTHER): Payer: PRIVATE HEALTH INSURANCE | Admitting: Neurology

## 2019-02-03 DIAGNOSIS — Z760 Encounter for issue of repeat prescription: Secondary | ICD-10-CM

## 2019-02-03 DIAGNOSIS — G43711 Chronic migraine without aura, intractable, with status migrainosus: Secondary | ICD-10-CM

## 2019-02-03 MED ORDER — AIMOVIG 140 MG/ML ~~LOC~~ SOAJ: 140.0000 mg | SUBCUTANEOUS | 0 refills | Status: DC

## 2019-02-03 NOTE — Progress Notes (Signed)
Patient arrived to pickup Aimovig 140 mg samples x 3 pens per v.o. Dr. Jaynee Eagles. Med/allergy list printed and included with the samples. Pt verbalized appreciation.

## 2019-02-03 NOTE — Progress Notes (Signed)
[facility]    Provider:  Dr Jaynee Eagles Referring Provider: Lujean Amel, MD Primary Care Physician:  Lujean Amel, MD  Virtual Visit via Telephone Note  I connected with [name] on 02/03/2019 at 11:30 AM EDT by telephone and verified that I am speaking with the correct person using two identifiers.  Location: Patient: Home Provider: Office   I discussed the limitations, risks, security and privacy concerns of performing an evaluation and management service by telephone and the availability of in person appointments. I also discussed with the patient that there may be a patient responsible charge related to this service. The patient expressed understanding and agreed to proceed.   Follow Up Instructions:    I discussed the assessment and treatment plan with the patient. The patient was provided an opportunity to ask questions and all were answered. The patient agreed with the plan and demonstrated an understanding of the instructions.   The patient was advised to call back or seek an in-person evaluation if the symptoms worsen or if the condition fails to improve as anticipated.  I provided 5 minutes of non-face-to-face time during this encounter.   Melvenia Beam, MD   CC:  Migraine  Interval history 02/05/2019: Doing excellent on botox. - Doing excellent on Botox alone. NOT ON CGRP. Continue botox, > 75% improvement in frequency and severity of migraines.  Interval History 08/21/2018: Patient reports that the cgrp has not worked. Sine she stopped the botox and remained on the Aimovig The migraines have returned since she stopped the botox. At this time she is not takng the Aimovig and would like to go back to the botox. DIscussed this, will try and get both botox and cgrp approved but if we cannot then she will stop the cgrp in favor of botox   HPI 12/30/2017:  Nancy Porter is a 48 y.o. female here as requested by Dr. Dorthy Cooler for migraines.  PMHx anxiety, suicide attempt, tobacco use, ADHD. She has had a continuousheadache since July 27th. She has Daily headaches and >15 migraine days a month for over a year but worsening to daily migraines since July 27th. The headache is unilateral, but can be bilateral temporal and periorbital, throbbing/pulsating/pounding, +photo/phonophobia, +nasuea, +vomiting, movement makes it worse, no aura, she takes abortive medication daily, she has no medication overuse, unknown inciting events, she has been to other neurologists, she has been to [facility], she has been to the emergency room and has been admitted. Severe pain. Migraine lasts 24-72 hours. She denies any OTC medication use since July. She even stopped the Benadryl. She describes lots of pressure. No aura. No medication overuse, no aura. She has been to the eye doctor, she has had imaging that is normal. The episodes are severe. She has associated tingling and some dizziness.   meds tried: butalbital, imitrex, advil, tylenol, excedrin, topamax, benadryl, ativan, DHE, compazine, toradol, zomig, nortriptyline, prednisone, nortriptyline, trazodone  Reviewed notes, labs and imaging from outside physicians, which showed:  Personally reviewed images on CD patient brought MRI brain and MRA head appears normal  Reviewed notes from headache clinic [facility] 12/2017. Migraine for 19 days, intense pressure, light sensitivity, has been to the ED, has seen neurosurgery, taking excedrin and imitrex, daily headache in the forehead and temporal regions, wors ein the afternoons, headaches for 20 years. Neuro exam was normal. Medication overuse was disussed. Also seen prior 8/16 for 3 weeks of throbbing headaches. She is "going insane", headaches 3-4 days  a week.   11/20/2017: BUn 11, creatinine 0.59  Review of Systems: Patient complains of symptoms per HPI as well as the following symptoms: headache, numbness, dizziness, anxiety. Pertinent negatives and positives  per HPI. All others negative.   Social History   Socioeconomic History  . Marital status: Married    Spouse name: Renae Fickle  . Number of children: 2  . Years of education: some college  . Highest education level: Some college, no degree  Occupational History  . Occupation: Scientist, physiological: [employer]  Social Needs  . Financial resource strain: Not on file  . Food insecurity    Worry: Not on file    Inability: Not on file  . Transportation needs    Medical: Not on file    Non-medical: Not on file  Tobacco Use  . Smoking status: Former Smoker    Packs/day: 1.00    Years: 35.00    Pack years: 35.00    Types: Cigarettes    Quit date: 01/08/2019    Years since quitting: 0.0  . Smokeless tobacco: Never Used  Substance and Sexual Activity  . Alcohol use: Yes    Comment: occ maybe 1-2 a month  . Drug use: No    Comment: cocaine in the past  . Sexual activity: Not on file  Lifestyle  . Physical activity    Days per week: Not on file    Minutes per session: Not on file  . Stress: Not on file  Relationships  . Social Musician on phone: Not on file    Gets together: Not on file    Attends religious service: Not on file    Active member of club or organization: Not on file    Attends meetings of clubs or organizations: Not on file    Relationship status: Not on file  . Intimate partner violence    Fear of current or ex partner: Not on file    Emotionally abused: Not on file    Physically abused: Not on file    Forced sexual activity: Not on file  Other Topics Concern  . Not on file  Social History Narrative   Patient is left-handed. She lives with her husband and daughter in a 1 story house.  She has not been exercising in the last year.    3 cups coffee daily.    Family History  Problem Relation Age of Onset  . Glaucoma Mother   . Heart disease Mother   . Deep vein thrombosis Father   . Migraines Sister   . Stroke Maternal  Grandmother   . Aneurysm Maternal Grandmother   . Heart disease Maternal Grandmother   . Diabetes Paternal Uncle        adult onset  . CAD Maternal Grandfather     Past Medical History:  Diagnosis Date  . Anxiety   . Depression   . Deviated septum   . Hx of migraines   . Hx of suicide attempt 2010   overdose on Trileptal, has been on Seroquel in the past  . Hyperlipidemia   . Tobacco use     Past Surgical History:  Procedure Laterality Date  . ABDOMINAL HYSTERECTOMY    . BREAST ENHANCEMENT SURGERY    . CESAREAN SECTION     x 2  . SHOULDER SURGERY Left    impingement    Current Outpatient Medications  Medication Sig Dispense Refill  . albuterol (VENTOLIN  HFA) 108 (90 Base) MCG/ACT inhaler Inhale 2 puffs into the lungs every 6 (six) hours as needed for wheezing or shortness of breath. 6.7 g 11  . benzonatate (TESSALON) 100 MG capsule Take 1 capsule (100 mg total) by mouth 3 (three) times daily as needed for cough. 90 capsule 0  . BREO ELLIPTA 100-25 MCG/INH AEPB TAKE 1 PUFF BY MOUTH EVERY DAY    . Erenumab-aooe (AIMOVIG) 140 MG/ML SOAJ Inject 140 mg into the skin every 30 (thirty) days. 3 pen 0  . fluticasone (FLONASE) 50 MCG/ACT nasal spray Place 2 sprays into both nostrils daily. 16 g 2  . gabapentin (NEURONTIN) 100 MG capsule Take 100 mg by mouth 2 (two) times daily.    . nortriptyline (PAMELOR) 25 MG capsule Take 1 capsule (25 mg total) by mouth at bedtime. 90 capsule 3  . oxybutynin (DITROPAN) 5 MG tablet TAKE 1 TABLET BY MOUTH EVERY 6 TO 8 HOURS AS NEEDED FOR URINARY URGENCY/FREQUENCY    . sertraline (ZOLOFT) 100 MG tablet TAKE 1.5 TABLETS BY MOUTH AT BEDTIME FOR ANXIETY    . topiramate (TOPAMAX) 100 MG tablet Take 1 tablet (100 mg total) by mouth 2 (two) times daily. 180 tablet 1  . Ubrogepant (UBRELVY) 50 MG TABS Take 100 mg by mouth every 2 (two) hours as needed. Max  a day 10 tablet 6   No current facility-administered medications for this visit.      Allergies as of 02/03/2019  . (No Known Allergies)    Vitals: LMP  (LMP Unknown)  Last Weight:  Wt Readings from Last 1 Encounters:  01/22/19 157 lb (71.2 kg)   Last Height:   Ht Readings from Last 1 Encounters:  01/22/19  (1.702 m)    Prior examination:  Physical exam: Exam: Gen: NAD, conversant, well nourised, well groomed                     CV: RRR, no MRG. No Carotid Bruits. No peripheral edema, warm, nontender Eyes: Conjunctivae clear without exudates or hemorrhage  Neuro: Detailed Neurologic Exam  Speech:    Speech is normal; fluent and spontaneous with normal comprehension.  Cognition:    The patient is oriented to person, place, and time;     recent and remote memory intact;     language fluent;     normal attention, concentration,     fund of knowledge Cranial Nerves:    The pupils are equal, round, and reactive to light. The fundi are normal and spontaneous venous pulsations are present. Visual fields are full to finger confrontation. Extraocular movements are intact. Trigeminal sensation is intact and the muscles of mastication are normal. The face is symmetric. The palate elevates in the midline. Hearing intact. Voice is normal. Shoulder shrug is normal. The tongue has normal motion without fasciculations.   Coordination:    Normal finger to nose and heel to shin. Normal rapid alternating movements.   Gait:    Heel-toe and tandem gait are normal.   Motor Observation:    No asymmetry, no atrophy, and no involuntary movements noted. Tone:    Normal muscle tone.    Posture:    Posture is normal. normal erect    Strength:    Strength is V/V in the upper and lower limbs.      Sensation: intact to LT     Reflex Exam:  DTR's:    Deep tendon reflexes in the upper and lower extremities are normal  bilaterally.   Toes:    The toes are downgoing bilaterally.   Clonus:    Clonus is absent.       Assessment/Plan:  48 year old with chronic  intractable headaches.   Doing excellent on botox. - Doing excellent on Botox alone. NOT ON CGRP. Continue botox, > 75% improvement in frequency and severity of migraines.  I will No charge her, this could have been a nurse call she didn't need an appointment to ask if we can continue Botox.   To prevent or relieve headaches, try the following: Cool Compress. Lie down and place a cool compress on your head.  Avoid headache triggers. If certain foods or odors seem to have triggered your migraines in the past, avoid them. A headache diary might help you identify triggers.  Include physical activity in your daily routine. Try a daily walk or other moderate aerobic exercise.  Manage stress. Find healthy ways to cope with the stressors, such as delegating tasks on your to-do list.  Practice relaxation techniques. Try deep breathing, yoga, massage and visualization.  Eat regularly. Eating regularly scheduled meals and maintaining a healthy diet might help prevent headaches. Also, drink plenty of fluids.  Follow a regular sleep schedule. Sleep deprivation might contribute to headaches Consider biofeedback. With this mind-body technique, you learn to control certain bodily functions - such as muscle tension, heart rate and blood pressure - to prevent headaches or reduce headache pain.    Proceed to emergency room if you experience new or worsening symptoms or symptoms do not resolve, if you have new neurologic symptoms or if headache is severe, or for any concerning symptom.   Provided education and documentation from American headache Society toolbox including articles on: chronic migraine medication overuse headache, chronic migraines, prevention of migraines, behavioral and other nonpharmacologic treatments for headache.    Cc: Darrow BussingKoirala, Dibas, MD  Naomie DeanAntonia Ahern, MD  [facility] [address] [place], [place] [id number]  Phone [phone] Fax [phone]

## 2019-02-05 ENCOUNTER — Ambulatory Visit: Payer: Self-pay

## 2019-02-09 NOTE — Telephone Encounter (Signed)
We received a denial letter from [employer]. Aimovig denied. Pt is currently using samples.   If we should choose to appeal, fax appeal with clinical note to [phone].

## 2019-03-11 ENCOUNTER — Other Ambulatory Visit: Payer: Self-pay | Admitting: Critical Care Medicine

## 2019-03-11 DIAGNOSIS — R05 Cough: Secondary | ICD-10-CM

## 2019-03-11 DIAGNOSIS — R053 Chronic cough: Secondary | ICD-10-CM

## 2019-03-26 NOTE — Telephone Encounter (Signed)
Error. DW  °

## 2019-03-27 ENCOUNTER — Ambulatory Visit (INDEPENDENT_AMBULATORY_CARE_PROVIDER_SITE_OTHER): Payer: PRIVATE HEALTH INSURANCE | Admitting: Critical Care Medicine

## 2019-03-27 ENCOUNTER — Other Ambulatory Visit: Payer: Self-pay

## 2019-03-27 ENCOUNTER — Encounter: Payer: Self-pay | Admitting: Critical Care Medicine

## 2019-03-27 ENCOUNTER — Ambulatory Visit: Payer: PRIVATE HEALTH INSURANCE | Admitting: Critical Care Medicine

## 2019-03-27 ENCOUNTER — Ambulatory Visit (INDEPENDENT_AMBULATORY_CARE_PROVIDER_SITE_OTHER): Payer: PRIVATE HEALTH INSURANCE

## 2019-03-27 ENCOUNTER — Telehealth: Payer: Self-pay | Admitting: Critical Care Medicine

## 2019-03-27 DIAGNOSIS — R05 Cough: Secondary | ICD-10-CM

## 2019-03-27 DIAGNOSIS — R053 Chronic cough: Secondary | ICD-10-CM

## 2019-03-27 DIAGNOSIS — J209 Acute bronchitis, unspecified: Secondary | ICD-10-CM | POA: Diagnosis not present

## 2019-03-27 MED ORDER — AMOXICILLIN-POT CLAVULANATE 875-125 MG PO TABS: 1.0000 | ORAL_TABLET | Freq: Two times a day (BID) | ORAL | 0 refills | Status: DC

## 2019-03-27 MED ORDER — PREDNISONE 10 MG PO TABS: 40.0000 mg | ORAL_TABLET | Freq: Every day | ORAL | 0 refills | Status: AC

## 2019-03-27 NOTE — Progress Notes (Signed)
Synopsis: Referred in October 2020 for abnormal CT by Darrow BussingKoirala, Dibas, MD.  Subjective:   PATIENT ID: Nancy Porter GENDER: female DOB: [birth year], MRN: [id number]  Chief Complaint  Patient presents with  . Follow-up    Patient has a cough and congestion. Patient's cough is productive with green sputum. Patient states she is choking in middle of the night then has a coughing fit.    Virtual Visit via Video Note  I connected with Nancy Porter  on 03/27/19 at  3:00 PM EST by a video enabled telemedicine application and verified that I am speaking with the correct person using two identifiers.  Location: Patient: Home Provider: Office - [facility] - [address], [address], [place], [place] [id number]  I discussed the limitations of evaluation and management by telemedicine and the availability of in person appointments. The patient expressed understanding and agreed to proceed. I also discussed with the patient that there may be a patient responsible charge related to this service. The patient expressed understanding and agreed to proceed.  Patient consented to consult via telephone: Yes People present and their role in pt care: Pt     I discussed the assessment and treatment plan with the patient. The patient was provided an opportunity to ask questions and all were answered. The patient agreed with the plan and demonstrated an understanding of the instructions.   The patient was advised to call back or seek an in-person evaluation if the symptoms worsen or if the condition fails to improve as anticipated.  I provided 22 minutes of non-face-to-face time during this encounter.     Nancy Porter is a 48 year old woman who is being evaluated for recurrent bronchitis.  She has recently been treated by her primary care physician twice with antibiotics and prednisone (first with azithromycin for 5 days, next with levofloxacin for 5 days) and both times her symptoms have  improved somewhat, but return when she completes her medications.  She is waking up at night with a choking cough.  She has green sputum.  She has noticed dyspnea on exertion associated with this, but denies fever, chills, sweats, GI symptoms, rhinorrhea, and postnasal drip.  She has had some intermittent chest pain across both sides of her chest-sometimes sharp, sometimes dull.  She was tested for Covid multiple times after there were coworkers that had been diagnosed with Covid.  She has been working from home.  Her PFTs are scheduled for next month.  She quit smoking and has not restarted since she was last seen.  She continues to use Breo once daily, and has been using her albuterol more recently, a few times a day.  She continues to use Flonase twice daily.     OV 01/22/2019: Nancy Porter is a 48 year old woman referred for evaluation of abnormal chest CT and chronic cough.  She has been coughing since March 2020, and it has worsened over time.  Her cough improved somewhat when she took courses of steroids and antibiotics, but it flared up again afterwards.  She has periods of intractable coughing.  She has sputum they can vary from clear to gray, sometimes with black specks in it.  She denies hemoptysis.  She has no history of heartburn, allergies, rhinorrhea, postnasal drip, hoarseness.  She has a 35-pack-year smoking history, but quit about 2 and half weeks ago.  Since quitting her cough is improved slightly.  She was started on Breo about a month ago but has not noticed much  difference.  She has been on albuterol for a few months, but uses it infrequently.  She has no significant history of using electronic cigarettes.  She is not on an ACE inhibitor.  She does a lot of crafts, and is around dust from wood, but denies ceramics dust.  She has no family history of lung disease.  The CT scan of her chest was performed to follow-up and abnormal CT of her abdomen, which was performed to evaluate right  lower quadrant abdominal pain.    Past Medical History:  Diagnosis Date  . Anxiety   . Depression   . Deviated septum   . Hx of migraines   . Hx of suicide attempt 2010   overdose on Trileptal, has been on Seroquel in the past  . Hyperlipidemia   . Tobacco use      Family History  Problem Relation Age of Onset  . Glaucoma Mother   . Heart disease Mother   . Deep vein thrombosis Father   . Migraines Sister   . Stroke Maternal Grandmother   . Aneurysm Maternal Grandmother   . Heart disease Maternal Grandmother   . Diabetes Paternal Uncle        adult onset  . CAD Maternal Grandfather      Past Surgical History:  Procedure Laterality Date  . ABDOMINAL HYSTERECTOMY    . BREAST ENHANCEMENT SURGERY    . CESAREAN SECTION     x 2  . SHOULDER SURGERY Left    impingement    Social History   Socioeconomic History  . Marital status: Married    Spouse name: Renae Fickle  . Number of children: 2  . Years of education: some college  . Highest education level: Some college, no degree  Occupational History  . Occupation: Scientist, physiological: [employer]  Tobacco Use  . Smoking status: Former Smoker    Packs/day: 1.00    Years: 35.00    Pack years: 35.00    Types: Cigarettes    Quit date: 01/08/2019    Years since quitting: 0.2  . Smokeless tobacco: Never Used  Substance and Sexual Activity  . Alcohol use: Yes    Comment: occ maybe 1-2 a month  . Drug use: No    Comment: cocaine in the past  . Sexual activity: Not on file  Other Topics Concern  . Not on file  Social History Narrative   Patient is left-handed. She lives with her husband and daughter in a 1 story house.  She has not been exercising in the last year.    3 cups coffee daily.   Social Determinants of Health   Financial Resource Strain:   . Difficulty of Paying Living Expenses: Not on file  Food Insecurity:   . Worried About Programme researcher, broadcasting/film/video in the Last Year: Not on file  . Ran  Out of Food in the Last Year: Not on file  Transportation Needs:   . Lack of Transportation (Medical): Not on file  . Lack of Transportation (Non-Medical): Not on file  Physical Activity:   . Days of Exercise per Week: Not on file  . Minutes of Exercise per Session: Not on file  Stress:   . Feeling of Stress : Not on file  Social Connections:   . Frequency of Communication with Friends and Family: Not on file  . Frequency of Social Gatherings with Friends and Family: Not on file  . Attends Religious Services: Not  on file  . Active Member of Clubs or Organizations: Not on file  . Attends Archivist Meetings: Not on file  . Marital Status: Not on file  Intimate Partner Violence:   . Fear of Current or Ex-Partner: Not on file  . Emotionally Abused: Not on file  . Physically Abused: Not on file  . Sexually Abused: Not on file     No Known Allergies   Immunization History  Administered Date(s) Administered  . Influenza,inj,Quad PF,6+ Mos 01/22/2019    Outpatient Medications Prior to Visit  Medication Sig Dispense Refill  . albuterol (VENTOLIN HFA) 108 (90 Base) MCG/ACT inhaler Inhale 2 puffs into the lungs every 6 (six) hours as needed for wheezing or shortness of breath. 6.7 g 11  . benzonatate (TESSALON) 100 MG capsule Take 1 capsule (100 mg total) by mouth 3 (three) times daily as needed for cough. 90 capsule 0  . BREO ELLIPTA 100-25 MCG/INH AEPB TAKE 1 PUFF BY MOUTH EVERY DAY    . Erenumab-aooe (AIMOVIG) 140 MG/ML SOAJ Inject 140 mg into the skin every 30 (thirty) days. 3 pen 0  . fluticasone (FLONASE) 50 MCG/ACT nasal spray SPRAY 2 SPRAYS INTO EACH NOSTRIL EVERY DAY 16 mL 1  . gabapentin (NEURONTIN) 100 MG capsule Take 100 mg by mouth 2 (two) times daily.    . nortriptyline (PAMELOR) 25 MG capsule Take 1 capsule (25 mg total) by mouth at bedtime. 90 capsule 3  . oxybutynin (DITROPAN) 5 MG tablet TAKE 1 TABLET BY MOUTH EVERY 6 TO 8 HOURS AS NEEDED FOR URINARY  URGENCY/FREQUENCY    . sertraline (ZOLOFT) 100 MG tablet TAKE 1.5 TABLETS BY MOUTH AT BEDTIME FOR ANXIETY    . topiramate (TOPAMAX) 100 MG tablet Take 1 tablet (100 mg total) by mouth 2 (two) times daily. 180 tablet 1  . Ubrogepant (UBRELVY) 50 MG TABS Take 100 mg by mouth every 2 (two) hours as needed. Max 200mg  a day 10 tablet 6   No facility-administered medications prior to visit.    Review of Systems  Constitutional: Negative for chills, fever and weight loss.  HENT: Negative.   Respiratory: Positive for cough, sputum production, shortness of breath and wheezing. Negative for hemoptysis.   Cardiovascular: Positive for chest pain. Negative for leg swelling.  Gastrointestinal: Negative for heartburn, nausea and vomiting.  Genitourinary: Negative.   Musculoskeletal: Negative for joint pain and myalgias.  Skin: Negative for rash.  Neurological: Negative for dizziness and focal weakness.       Chronic migraines  Endo/Heme/Allergies: Negative for environmental allergies. Does not bruise/bleed easily.  Psychiatric/Behavioral: Negative.      Objective:   There were no vitals filed for this visit.   on   RA BMI Readings from Last 3 Encounters:  01/22/19 24.59 kg/m  08/21/18 25.53 kg/m  12/30/17 25.37 kg/m   Wt Readings from Last 3 Encounters:  01/22/19 157 lb (71.2 kg)  08/21/18 163 lb (73.9 kg)  12/30/17 162 lb (73.5 kg)    Physical Exam-limited due to televisit. No conversational dyspnea.  Occasional dry sounding cough.  Answering questions appropriately with normal speech.   CBC    Component Value Date/Time   WBC 11.5 (H) 05/16/2017 1943   RBC 4.72 05/16/2017 1943   HGB 14.6 05/16/2017 1943   HCT 43.6 05/16/2017 1943   PLT 233 05/16/2017 1943   MCV 92.5 05/16/2017 1943   MCH 30.9 05/16/2017 1943   MCHC 33.4 05/16/2017 1943   RDW 13.3 05/16/2017 1943  Chest Imaging- films reviewed: CT chest 12/17/2018- no pulmonary embolism, no mediastinal or hilar  adenopathy.  Minimally dilated distal esophagus.  Generous pulmonary artery, left atrium, ascending aorta. Mosaicism, airway thickening, peripheral tiny centrilobular nodules, few small intraparenchymal cysts.  Pulmonary Functions Testing Results: No flowsheet data found.     Assessment & Plan:     ICD-10-CM   1. Chronic cough  R05 DG Chest 2 View    Respiratory or Resp and Sputum Culture  2. Acute bronchitis, unspecified organism  J20.9 DG Chest 2 View    Respiratory or Resp and Sputum Culture      Likely acute bronchitis, but concern for possible pneumonia with persistence despite short courses of antibiotics. She has a history of chronic cough concerning for possible COPD. -Stat CXR today -Sputum culture -Will prescribe a course of Augmentin for 10 days and prednisone for 5 days. -She understands that if her symptoms worsen or fail to improve she needs to present to the emergency department for evaluation. -Congratulated her on her success with continue to avoid smoking -Continue Breo -Continue albuterol as needed  RTC as previously scheduled on 05/12/2019.   Current Outpatient Medications:  .  albuterol (VENTOLIN HFA) 108 (90 Base) MCG/ACT inhaler, Inhale 2 puffs into the lungs every 6 (six) hours as needed for wheezing or shortness of breath., Disp: 6.7 g, Rfl: 11 .  benzonatate (TESSALON) 100 MG capsule, Take 1 capsule (100 mg total) by mouth 3 (three) times daily as needed for cough., Disp: 90 capsule, Rfl: 0 .  BREO ELLIPTA 100-25 MCG/INH AEPB, TAKE 1 PUFF BY MOUTH EVERY DAY, Disp: , Rfl:  .  Erenumab-aooe (AIMOVIG) 140 MG/ML SOAJ, Inject 140 mg into the skin every 30 (thirty) days., Disp: 3 pen, Rfl: 0 .  fluticasone (FLONASE) 50 MCG/ACT nasal spray, SPRAY 2 SPRAYS INTO EACH NOSTRIL EVERY DAY, Disp: 16 mL, Rfl: 1 .  gabapentin (NEURONTIN) 100 MG capsule, Take 100 mg by mouth 2 (two) times daily., Disp: , Rfl:  .  nortriptyline (PAMELOR) 25 MG capsule, Take 1 capsule (25 mg  total) by mouth at bedtime., Disp: 90 capsule, Rfl: 3 .  oxybutynin (DITROPAN) 5 MG tablet, TAKE 1 TABLET BY MOUTH EVERY 6 TO 8 HOURS AS NEEDED FOR URINARY URGENCY/FREQUENCY, Disp: , Rfl:  .  sertraline (ZOLOFT) 100 MG tablet, TAKE 1.5 TABLETS BY MOUTH AT BEDTIME FOR ANXIETY, Disp: , Rfl:  .  topiramate (TOPAMAX) 100 MG tablet, Take 1 tablet (100 mg total) by mouth 2 (two) times daily., Disp: 180 tablet, Rfl: 1 .  Ubrogepant (UBRELVY) 50 MG TABS, Take 100 mg by mouth every 2 (two) hours as needed. Max  a day, Disp: 10 tablet, Rfl: 6   Steffanie Dunn, DO [facility] 03/27/2019 3:26 PM

## 2019-03-27 NOTE — Patient Instructions (Addendum)
Thank you for visiting Dr. Carlis Abbott at [facility]. We recommend the following: Orders Placed This Encounter  Procedures  . Respiratory or Resp and Sputum Culture  . DG Chest 2 View   Orders Placed This Encounter  Procedures  . Respiratory or Resp and Sputum Culture    Standing Status:   Future    Standing Expiration Date:   03/26/2020  . DG Chest 2 View    Standing Status:   Future    Standing Expiration Date:   05/27/2020    Order Specific Question:   Reason for Exam (SYMPTOM  OR DIAGNOSIS REQUIRED)    Answer:   productive cough    Order Specific Question:   Is patient pregnant?    Answer:   Unknown (Please Explain)    Order Specific Question:   Preferred imaging location?    Answer:   Internal    Order Specific Question:   Radiology Contrast Protocol - do NOT remove file path    Answer:   [email or web address]    Meds ordered this encounter  Medications  . predniSONE (DELTASONE) 10 MG tablet    Sig: Take 4 tablets (40 mg total) by mouth daily with breakfast for 5 days.    Dispense:  20 tablet    Refill:  0  . amoxicillin-clavulanate (AUGMENTIN) 875-125 MG tablet    Sig: Take 1 tablet by mouth 2 (two) times daily.    Dispense:  20 tablet    Refill:  0    Follow up scheduled after PFTs on 05/12/2019.   Please do your part to reduce the spread of COVID-19.

## 2019-03-27 NOTE — Progress Notes (Deleted)
Synopsis: Referred in October 2020 for abnormal CT by Lujean Amel, MD.  Subjective:   PATIENT ID: Nancy Porter GENDER: female DOB: [birth year], MRN: [id number]  No chief complaint on file.   Virtual Visit via Video Note  I connected with Nancy Porter  on 03/27/19 at  2:30 PM EST by a video enabled telemedicine application and verified that I am speaking with the correct person using two identifiers.  Location: Patient: Home Provider: Office - [facility] - [address], [address], [place],  [address]  I discussed the limitations of evaluation and management by telemedicine and the availability of in person appointments. The patient expressed understanding and agreed to proceed. I also discussed with the patient that there may be a patient responsible charge related to this service. The patient expressed understanding and agreed to proceed.  Patient consented to consult via telephone: Yes People present and their role in pt care: Pt    I discussed the assessment and treatment plan with the patient. The patient was provided an opportunity to ask questions and all were answered. The patient agreed with the plan and demonstrated an understanding of the instructions.   The patient was advised to call back or seek an in-person evaluation if the symptoms worsen or if the condition fails to improve as anticipated.  I provided *** minutes of non-face-to-face time during this encounter.     HPI    OV 01/22/2019: Nancy Porter is a 48 year old woman referred for evaluation of abnormal chest CT and chronic cough.  She has been coughing since March 2020, and it has worsened over time.  Her cough improved somewhat when she took courses of steroids and antibiotics, but it flared up again afterwards.  She has periods of intractable coughing.  She has sputum they can vary from clear to gray, sometimes with black specks in it.  She denies hemoptysis.  She has no history  of heartburn, allergies, rhinorrhea, postnasal drip, hoarseness.  She has a 35-pack-year smoking history, but quit about 2 and half weeks ago.  Since quitting her cough is improved slightly.  She was started on Breo about a month ago but has not noticed much difference.  She has been on albuterol for a few months, but uses it infrequently.  She has no significant history of using electronic cigarettes.  She is not on an ACE inhibitor.  She does a lot of crafts, and is around dust from wood, but denies ceramics dust.  She has no family history of lung disease.  The CT scan of her chest was performed to follow-up and abnormal CT of her abdomen, which was performed to evaluate right lower quadrant abdominal pain.         Past Medical History:  Diagnosis Date   Anxiety    Depression    Deviated septum    Hx of migraines    Hx of suicide attempt 2010   overdose on Trileptal, has been on Seroquel in the past   Hyperlipidemia    Tobacco use      Family History  Problem Relation Age of Onset   Glaucoma Mother    Heart disease Mother    Deep vein thrombosis Father    Migraines Sister    Stroke Maternal Grandmother    Aneurysm Maternal Grandmother    Heart disease Maternal Grandmother    Diabetes Paternal Uncle        adult onset   CAD Maternal Grandfather  Past Surgical History:  Procedure Laterality Date   ABDOMINAL HYSTERECTOMY     BREAST ENHANCEMENT SURGERY     CESAREAN SECTION     x 2   SHOULDER SURGERY Left    impingement    Social History   Socioeconomic History   Marital status: Married    Spouse name: Renae Fickle   Number of children: 2   Years of education: some college   Highest education level: Some college, no degree  Occupational History   Occupation: Scientist, physiological: [employer]  Tobacco Use   Smoking status: Former Smoker    Packs/day: 1.00    Years: 35.00    Pack years: 35.00    Types: Cigarettes      Quit date: 01/08/2019    Years since quitting: 0.2   Smokeless tobacco: Never Used  Substance and Sexual Activity   Alcohol use: Yes    Comment: occ maybe 1-2 a month   Drug use: No    Comment: cocaine in the past   Sexual activity: Not on file  Other Topics Concern   Not on file  Social History Narrative   Patient is left-handed. She lives with her husband and daughter in a 1 story house.  She has not been exercising in the last year.    3 cups coffee daily.   Social Determinants of Health   Financial Resource Strain:    Difficulty of Paying Living Expenses: Not on file  Food Insecurity:    Worried About Programme researcher, broadcasting/film/video in the Last Year: Not on file   [employer] of Food in the Last Year: Not on file  Transportation Needs:    Lack of Transportation (Medical): Not on file   Lack of Transportation (Non-Medical): Not on file  Physical Activity:    Days of Exercise per Week: Not on file   Minutes of Exercise per Session: Not on file  Stress:    Feeling of Stress : Not on file  Social Connections:    Frequency of Communication with Friends and Family: Not on file   Frequency of Social Gatherings with Friends and Family: Not on file   Attends Religious Services: Not on file   Active Member of Clubs or Organizations: Not on file   Attends Banker Meetings: Not on file   Marital Status: Not on file  Intimate Partner Violence:    Fear of Current or Ex-Partner: Not on file   Emotionally Abused: Not on file   Physically Abused: Not on file   Sexually Abused: Not on file     No Known Allergies   Immunization History  Administered Date(s) Administered   Influenza,inj,Quad PF,6+ Mos 01/22/2019    Outpatient Medications Prior to Visit  Medication Sig Dispense Refill   albuterol (VENTOLIN HFA) 108 (90 Base) MCG/ACT inhaler Inhale 2 puffs into the lungs every 6 (six) hours as needed for wheezing or shortness of breath. 6.7 g 11    benzonatate (TESSALON) 100 MG capsule Take 1 capsule (100 mg total) by mouth 3 (three) times daily as needed for cough. 90 capsule 0   BREO ELLIPTA 100-25 MCG/INH AEPB TAKE 1 PUFF BY MOUTH EVERY DAY     Erenumab-aooe (AIMOVIG) 140 MG/ML SOAJ Inject 140 mg into the skin every 30 (thirty) days. 3 pen 0   fluticasone (FLONASE) 50 MCG/ACT nasal spray SPRAY 2 SPRAYS INTO EACH NOSTRIL EVERY DAY 16 mL 1   gabapentin (NEURONTIN) 100 MG capsule  Take 100 mg by mouth 2 (two) times daily.     nortriptyline (PAMELOR) 25 MG capsule Take 1 capsule (25 mg total) by mouth at bedtime. 90 capsule 3   oxybutynin (DITROPAN) 5 MG tablet TAKE 1 TABLET BY MOUTH EVERY 6 TO 8 HOURS AS NEEDED FOR URINARY URGENCY/FREQUENCY     sertraline (ZOLOFT) 100 MG tablet TAKE 1.5 TABLETS BY MOUTH AT BEDTIME FOR ANXIETY     topiramate (TOPAMAX) 100 MG tablet Take 1 tablet (100 mg total) by mouth 2 (two) times daily. 180 tablet 1   Ubrogepant (UBRELVY) 50 MG TABS Take 100 mg by mouth every 2 (two) hours as needed. Max 200mg  a day 10 tablet 6   No facility-administered medications prior to visit.    Review of Systems  Constitutional: Negative for chills, fever and weight loss.  HENT: Negative.   Respiratory: Positive for cough and sputum production. Negative for hemoptysis, shortness of breath and wheezing.   Cardiovascular: Negative for chest pain and leg swelling.  Gastrointestinal: Positive for constipation. Negative for heartburn, nausea and vomiting.  Genitourinary: Negative.   Musculoskeletal: Negative for joint pain and myalgias.  Skin: Negative for rash.  Neurological: Negative for dizziness and focal weakness.       Chronic migraines  Endo/Heme/Allergies: Negative for environmental allergies. Does not bruise/bleed easily.  Psychiatric/Behavioral: Negative.      Objective:   There were no vitals filed for this visit.   on   RA BMI Readings from Last 3 Encounters:  01/22/19 24.59 kg/m  08/21/18 25.53  kg/m  12/30/17 25.37 kg/m   Wt Readings from Last 3 Encounters:  01/22/19 157 lb (71.2 kg)  08/21/18 163 lb (73.9 kg)  12/30/17 162 lb (73.5 kg)    Physical Exam   CBC    Component Value Date/Time   WBC 11.5 (H) 05/16/2017 1943   RBC 4.72 05/16/2017 1943   HGB 14.6 05/16/2017 1943   HCT 43.6 05/16/2017 1943   PLT 233 05/16/2017 1943   MCV 92.5 05/16/2017 1943   MCH 30.9 05/16/2017 1943   MCHC 33.4 05/16/2017 1943   RDW 13.3 05/16/2017 1943     Chest Imaging- films reviewed: CT chest 12/17/2018- no pulmonary embolism, no mediastinal or hilar adenopathy.  Minimally dilated distal esophagus.  Generous pulmonary artery, left atrium, ascending aorta. Mosaicism, airway thickening, peripheral tiny centrilobular nodules, few small intraparenchymal cysts.  Pulmonary Functions Testing Results: No flowsheet data found.     Assessment & Plan:   No diagnosis found.   Chronic cough- likely multifactorial due to upper airway irritation, history of tobacco abuse, and likely postnasal drip that she has not recognized.  I suspect she has a degree of COPD. -Agree with quitting smoking; congratulated her on her success so far - Mucinex twice daily -Flonase 2 sprays bilaterally once daily; she should use this until she follows up -Continue Breo - PFTs -Flu shot today -Tessalon 3 times daily as needed to help with symptoms for the next few weeks.  This is not meant to be a long-term medication, which was explained to the patient.  History tobacco abuse -Congratulated her on her success so far - Reinforced the importance of smoking cessation in the risk of ongoing tobacco use -Can discuss low-dose CT for lung cancer screening at age 48  Abnormal chest CT- referral centrilobular nodules due to mucous plugging of small airways -No additional follow-up required -Smoking cessation  RTC in about 2 months.   Current Outpatient Medications:  albuterol (VENTOLIN HFA) 108 (90 Base)  MCG/ACT inhaler, Inhale 2 puffs into the lungs every 6 (six) hours as needed for wheezing or shortness of breath., Disp: 6.7 g, Rfl: 11   benzonatate (TESSALON) 100 MG capsule, Take 1 capsule (100 mg total) by mouth 3 (three) times daily as needed for cough., Disp: 90 capsule, Rfl: 0   BREO ELLIPTA 100-25 MCG/INH AEPB, TAKE 1 PUFF BY MOUTH EVERY DAY, Disp: , Rfl:    Erenumab-aooe (AIMOVIG) 140 MG/ML SOAJ, Inject 140 mg into the skin every 30 (thirty) days., Disp: 3 pen, Rfl: 0   fluticasone (FLONASE) 50 MCG/ACT nasal spray, SPRAY 2 SPRAYS INTO EACH NOSTRIL EVERY DAY, Disp: 16 mL, Rfl: 1   gabapentin (NEURONTIN) 100 MG capsule, Take 100 mg by mouth 2 (two) times daily., Disp: , Rfl:    nortriptyline (PAMELOR) 25 MG capsule, Take 1 capsule (25 mg total) by mouth at bedtime., Disp: 90 capsule, Rfl: 3   oxybutynin (DITROPAN) 5 MG tablet, TAKE 1 TABLET BY MOUTH EVERY 6 TO 8 HOURS AS NEEDED FOR URINARY URGENCY/FREQUENCY, Disp: , Rfl:    sertraline (ZOLOFT) 100 MG tablet, TAKE 1.5 TABLETS BY MOUTH AT BEDTIME FOR ANXIETY, Disp: , Rfl:    topiramate (TOPAMAX) 100 MG tablet, Take 1 tablet (100 mg total) by mouth 2 (two) times daily., Disp: 180 tablet, Rfl: 1   Ubrogepant (UBRELVY) 50 MG TABS, Take 100 mg by mouth every 2 (two) hours as needed. Max  a day, Disp: 10 tablet, Rfl: 6   Steffanie Dunn, DO [facility] 03/27/2019 1:23 PM

## 2019-03-27 NOTE — Telephone Encounter (Signed)
Patient is scheduled for a video visit this afternoon at 3pm. Will forward to Dr. Carlis Abbott.

## 2019-03-31 ENCOUNTER — Other Ambulatory Visit: Payer: Self-pay | Admitting: Neurology

## 2019-04-06 NOTE — Progress Notes (Signed)
Cardiology Office Note  Date:  04/07/2019   ID:  Nancy Porter, DOB 01/23/1971, MRN [id number]  PCP:  Lujean Amel, MD   CC: chest pain, family hx  HPI:  Nancy Porter is a 48 year old woman with past medical history of Anxiety Smoking, quit 2 months ago Chronic bronchitis Headaches Who presents by referral from Dr. Lujean Amel  for consultation of her family history cardiac disease, left-sided chest pain  CT chest September 2020, no plaque,, images reviewed No coronary calcification, no aortic athero very fine, perfuse apical predominant centrilobular nodularity of the lungs. Findings are most consistent with smoking-related respiratory bronchiolitis.  CT abdomen September 2020, no plaque, images reviewed No aortic athero, no iliac athero  Left side chest pain "in there", 3 weeks At rest, not constant "something sitting on my chest at times" SOB with walking, recently quit smoking  PFTs ordered by pulmonary, not completed yet  "Tired all the time" Poor sleep  "cholesterol is high" But not available from primary care  EKG personally reviewed by myself on todays visit Shows normal sinus rhythm with rate 55 beats minute no significant ST-T wave changes   PMH:   has a past medical history of Anxiety, Arrhythmia, Depression, Deviated septum, migraines, suicide attempt (2010), Hyperlipidemia, and Tobacco use.  PSH:    Past Surgical History:  Procedure Laterality Date  . ABDOMINAL HYSTERECTOMY    . BREAST ENHANCEMENT SURGERY    . CESAREAN SECTION     x 2  . SHOULDER SURGERY Left    impingement    Current Outpatient Medications  Medication Sig Dispense Refill  . albuterol (VENTOLIN HFA) 108 (90 Base) MCG/ACT inhaler Inhale 2 puffs into the lungs every 6 (six) hours as needed for wheezing or shortness of breath. 6.7 g 11  . amoxicillin-clavulanate (AUGMENTIN) 875-125 MG tablet Take 1 tablet by mouth 2 (two) times daily. 20 tablet 0  . benzonatate  (TESSALON) 100 MG capsule Take 1 capsule (100 mg total) by mouth 3 (three) times daily as needed for cough. 90 capsule 0  . BREO ELLIPTA 100-25 MCG/INH AEPB TAKE 1 PUFF BY MOUTH EVERY DAY    . Erenumab-aooe (AIMOVIG) 140 MG/ML SOAJ Inject 140 mg into the skin every 30 (thirty) days. 3 pen 0  . fluticasone (FLONASE) 50 MCG/ACT nasal spray SPRAY 2 SPRAYS INTO EACH NOSTRIL EVERY DAY 16 mL 1  . gabapentin (NEURONTIN) 100 MG capsule Take 100 mg by mouth 2 (two) times daily.    . nortriptyline (PAMELOR) 25 MG capsule TAKE 1 CAPSULE (25 MG TOTAL) BY MOUTH AT BEDTIME. 90 capsule 3  . oxybutynin (DITROPAN) 5 MG tablet TAKE 1 TABLET BY MOUTH EVERY 6 TO 8 HOURS AS NEEDED FOR URINARY URGENCY/FREQUENCY    . sertraline (ZOLOFT) 100 MG tablet TAKE 1.5 TABLETS BY MOUTH AT BEDTIME FOR ANXIETY    . topiramate (TOPAMAX) 100 MG tablet Take 1 tablet (100 mg total) by mouth 2 (two) times daily. 180 tablet 1  . Ubrogepant (UBRELVY) 50 MG TABS Take 100 mg by mouth every 2 (two) hours as needed. Max 200mg  a day 10 tablet 6   No current facility-administered medications for this visit.     Allergies:   Patient has no known allergies.   Social History:  The patient  reports that she quit smoking about 2 months ago. Her smoking use included cigarettes. She has a 35.00 pack-year smoking history. She has never used smokeless tobacco. She reports current alcohol use. She reports current drug  use. Drug: Marijuana.   Family History:   family history includes Aneurysm in her maternal grandmother; Arrhythmia in her mother; CAD in her maternal grandfather; Deep vein thrombosis in her father; Diabetes in her paternal uncle; Glaucoma in her mother; Heart Problems in her father and mother; Heart disease in her father, maternal grandmother, and mother; Migraines in her sister; Stroke in her maternal grandmother.    Review of Systems: Review of Systems  Constitutional: Negative.   HENT: Negative.   Respiratory: Negative.    Cardiovascular: Positive for chest pain.  Gastrointestinal: Negative.   Musculoskeletal: Negative.   Neurological: Negative.   Psychiatric/Behavioral: Negative.   All other systems reviewed and are negative.   PHYSICAL EXAM: VS:  BP 109/74 (BP Location: Right Arm, Patient Position: Sitting, Cuff Size: Normal)   Pulse (!) 55   Ht 5\' 7"  (1.702 m)   Wt 139 lb 12 oz (63.4 kg)   LMP  (LMP Unknown)   SpO2 99%   BMI 21.89 kg/m  , BMI Body mass index is 21.89 kg/m. GEN: Well nourished, well developed, in no acute distress HEENT: normal Neck: no JVD, carotid bruits, or masses Cardiac: RRR; no murmurs, rubs, or gallops,no edema  Respiratory:  clear to auscultation bilaterally, normal work of breathing GI: soft, nontender, nondistended, + BS MS: no deformity or atrophy Skin: warm and dry, no rash Neuro:  Strength and sensation are intact Psych: euthymic mood, full affect   Recent Labs: No results found for requested labs within last 8760 hours.    Lipid Panel No results found for: CHOL, HDL, LDLCALC, TRIG    Wt Readings from Last 3 Encounters:  04/07/19 139 lb 12 oz (63.4 kg)  01/22/19 157 lb (71.2 kg)  08/21/18 163 lb (73.9 kg)       ASSESSMENT AND PLAN:  Problem List Items Addressed This Visit    None    Visit Diagnoses    Chest pain of uncertain etiology    -  Primary   Family history of cardiac disorder       Anxiety         Atypical left-sided chest pain Sometimes rest, atypical features Normal EKG, normal clinical exam CT scan reviewed from the jaw down to her femoral arteries No aortic atherosclerosis anywhere, no coronary calcification Quit smoking 2 months ago Nondiabetic No further testing needed.  Which is reviewed with her in detail  Family history cardiac disease So far no signs of premature cardiac disease In terms of whether to be on a cholesterol medication for self-reported high cholesterol, so far with no aortic atherosclerosis or coronary  calcification.  Will defer to primary care  Anxiety Recommended regular exercise program  Disposition:   F/U only as needed   Total encounter time more than 60 minutes  Greater than 50% was spent in counseling and coordination of care with the patient  Patient was seen in consultation for Dr. 10/21/18 and be referred back to their office for ongoing care of the issues detailed above   Signed, Darrow Bussing, M.D., Ph.D. [facility] [place], [place]

## 2019-04-07 ENCOUNTER — Ambulatory Visit (INDEPENDENT_AMBULATORY_CARE_PROVIDER_SITE_OTHER): Payer: PRIVATE HEALTH INSURANCE | Admitting: Cardiovascular Disease

## 2019-04-07 ENCOUNTER — Encounter: Payer: Self-pay | Admitting: Cardiovascular Disease

## 2019-04-07 ENCOUNTER — Other Ambulatory Visit: Payer: Self-pay

## 2019-04-07 VITALS — BP 109/74 | HR 55 | Ht 67.0 in | Wt 139.8 lb

## 2019-04-07 DIAGNOSIS — F419 Anxiety disorder, unspecified: Secondary | ICD-10-CM | POA: Diagnosis not present

## 2019-04-07 DIAGNOSIS — Z8249 Family history of ischemic heart disease and other diseases of the circulatory system: Secondary | ICD-10-CM | POA: Diagnosis not present

## 2019-04-07 DIAGNOSIS — R079 Chest pain, unspecified: Secondary | ICD-10-CM

## 2019-04-07 NOTE — Patient Instructions (Signed)

## 2019-04-15 NOTE — Addendum Note (Signed)
Addended by: Othelia Pulling C on: 04/15/2019 11:20 AM   Modules accepted: Orders

## 2019-04-18 ENCOUNTER — Other Ambulatory Visit: Payer: Self-pay | Admitting: Critical Care Medicine

## 2019-04-18 DIAGNOSIS — R05 Cough: Secondary | ICD-10-CM

## 2019-04-18 DIAGNOSIS — R053 Chronic cough: Secondary | ICD-10-CM

## 2019-04-21 ENCOUNTER — Encounter: Payer: Self-pay | Admitting: *Deleted

## 2019-04-22 ENCOUNTER — Telehealth (INDEPENDENT_AMBULATORY_CARE_PROVIDER_SITE_OTHER): Payer: 59 | Admitting: Primary Care

## 2019-04-22 ENCOUNTER — Telehealth: Payer: Self-pay | Admitting: Critical Care Medicine

## 2019-04-22 DIAGNOSIS — R05 Cough: Secondary | ICD-10-CM

## 2019-04-22 DIAGNOSIS — J029 Acute pharyngitis, unspecified: Secondary | ICD-10-CM | POA: Diagnosis not present

## 2019-04-22 DIAGNOSIS — J40 Bronchitis, not specified as acute or chronic: Secondary | ICD-10-CM

## 2019-04-22 DIAGNOSIS — J209 Acute bronchitis, unspecified: Secondary | ICD-10-CM | POA: Diagnosis not present

## 2019-04-22 DIAGNOSIS — Z20822 Contact with and (suspected) exposure to covid-19: Secondary | ICD-10-CM

## 2019-04-22 DIAGNOSIS — R0789 Other chest pain: Secondary | ICD-10-CM

## 2019-04-22 DIAGNOSIS — R062 Wheezing: Secondary | ICD-10-CM | POA: Diagnosis not present

## 2019-04-22 MED ORDER — PREDNISONE 10 MG PO TABS: ORAL_TABLET | ORAL | 0 refills | Status: DC

## 2019-04-22 MED ORDER — BENZONATATE 200 MG PO CAPS: 200.0000 mg | ORAL_CAPSULE | Freq: Three times a day (TID) | ORAL | 1 refills | Status: DC | PRN

## 2019-04-22 MED ORDER — AZITHROMYCIN 250 MG PO TABS: ORAL_TABLET | ORAL | 0 refills | Status: DC

## 2019-04-22 NOTE — Telephone Encounter (Signed)
Spoke with the patient. She stated she has appointment to be tested tomorrow at [facility] for covid due to exposure. She has developed a worsening cough with green chest congestion. (please see patient message dated 04/20/19). Scheduled for video visit today at 4:30 with Buelah Manis, NP.  Nothing further needed at this time.

## 2019-04-22 NOTE — Progress Notes (Signed)
Virtual Visit via Video Note  I connected with Nancy Porter on 04/22/19 at  4:30 PM EST by a video enabled telemedicine application and verified that I am speaking with the correct person using two identifiers.  Location: Patient: Home Provider: Office    I discussed the limitations of evaluation and management by telemedicine and the availability of in person appointments. The patient expressed understanding and agreed to proceed.  History of Present Illness: 49 year old female, former smoker (35 pack year hx). PMH significant for recurrent bronchitis. Patient of Dr. Chestine Spore, last seen on 03/27/19. Acute bronchitis vs possible pneumonia. Chronic cough conerning for possible COPD. She was given course Augmentin x 10 days, ordered for sputum culture and cxr. Continue Breo and prn albuterol.   04/22/2019 Patient contacted today for acute video visit for possible exposure to covid. States that her daughter's school closed yesterday after two teachers tested positive COVID. Daughter is feeling ok. Both are being tested tomorrow. Patient reports her cough started 2 days ago. Cough is dry with occasional production with green mucus. Associated chest tightness, wheezing and sore throat. She has a prescription for tessalon perles. Compliant with Breo inhaler. She has had to use her rescue inhaler more often the last couple of days. Denies fever, chills, headache, chest pain, N/V/D.    Observations/Objective:   - Able to speak in full sentences - No significant shortness of breath, wheezing or cough   Imaging: CT chest in September 2020 showed no evidence of pulmonary embolism, mediastinal/hilar adenopathy. Minimally dilated distal esophagus. Generous pulmonary artery, left atrium, ascending aorta. Mosaicism, airway thickening, peripheral tiny centrilobular nodules, few small intraparenchymal cysts.   Assessment and Plan:  Acute on chronic Bronchitis: - Former smoker, possible underlying COPD -  Potential Covid exposure, cough symptoms started 04/19/18  - RX zpack and prednisone taper as directed  - Continue Breo 100 and prn albuterol hfa - Recommend mucinex 600-1,200mg  twice daily with full glass of water  - Tessalon perles for cough suppression when needed and at night - Needs covid testing and schedule PFTs scheduled    Follow Up Instructions:  - As needed if symptoms do not improve - Need to schedule PFTs   I discussed the assessment and treatment plan with the patient. The patient was provided an opportunity to ask questions and all were answered. The patient agreed with the plan and demonstrated an understanding of the instructions.   The patient was advised to call back or seek an in-person evaluation if the symptoms worsen or if the condition fails to improve as anticipated.  I provided 22 minutes of non-face-to-face time during this encounter.   Glenford Bayley, NP

## 2019-04-23 ENCOUNTER — Ambulatory Visit: Payer: PRIVATE HEALTH INSURANCE | Attending: Internal Medicine

## 2019-04-23 ENCOUNTER — Other Ambulatory Visit: Payer: PRIVATE HEALTH INSURANCE

## 2019-04-23 DIAGNOSIS — Z20822 Contact with and (suspected) exposure to covid-19: Secondary | ICD-10-CM

## 2019-04-24 NOTE — Patient Instructions (Addendum)
Acute on chronic bronchitis: - Former smoker, possible underlying COPD - Potential covid exposure  - RX zpack and prednisone taper  - Recommend mucinex 600-1,200mg  twice daily with full glass of water  - Tessalon perles for cough suppression when needed and at night - Needs covid testing - Needs PFTs scheduled

## 2019-04-25 LAB — NOVEL CORONAVIRUS, NAA: SARS-CoV-2, NAA: NOT DETECTED

## 2019-04-29 ENCOUNTER — Telehealth: Payer: Self-pay | Admitting: Primary Care

## 2019-04-29 DIAGNOSIS — J40 Bronchitis, not specified as acute or chronic: Secondary | ICD-10-CM

## 2019-04-29 DIAGNOSIS — R05 Cough: Secondary | ICD-10-CM

## 2019-04-29 DIAGNOSIS — R053 Chronic cough: Secondary | ICD-10-CM

## 2019-04-29 MED ORDER — PREDNISONE 10 MG PO TABS: ORAL_TABLET | ORAL | 0 refills | Status: DC

## 2019-04-29 MED ORDER — LEVOFLOXACIN 750 MG PO TABS: 750.0000 mg | ORAL_TABLET | Freq: Every day | ORAL | 0 refills | Status: DC

## 2019-04-29 NOTE — Telephone Encounter (Signed)
I think we need to get a sputum culture. She just was treated for an exacerbation in December and now had another one pretty quickly, so I do worry there may be an infection we haven't treated yet.   Ok to refill prednisone one more time and prescribe levofloxacin 750mg  daily x 7 days. If her symptoms have not fully resolved after that, we need to evaluate her in person, hopefully in clinic but potentially in the ED if her symptoms are too severe to come into clinic.  LPC

## 2019-04-29 NOTE — Telephone Encounter (Signed)
Spoke with patient. She verbalized understanding. Prednisone and Levaquin have been called in. She stated that she was given a sputum cup last year which she still has. She lives in [place]. Did advise her that she could drop the sample off at [facility] at their lab. She verbalized understanding.   Order has been placed for sputum culture.   Nothing further needed.

## 2019-04-29 NOTE — Telephone Encounter (Signed)
Spoke with patient. She stated that she had a video visit with Nancy Porter on 04/24/19. She was prescribed a zpak and prednisone taper. She will take the last prednisone dose tomorrow. She is still having issues with SOB. Her chest feels tight and she is not able to take a deep breath without coughing. Her cough tends to increase at night even with her sleeping with multiple pillows behind her. Her cough is productive with thick white phlegm. Increased exhaustion. Denied any fevers. She had a negative covid test last week.   She wants to know if she needs to have an extended prednisone taper and another antibiotic. Pharmacy is [facility] on [address]. She would like to hear Dr. Ophelia Charter recommendations.   Dr. Chestine Spore, please advise. Thanks!

## 2019-05-05 NOTE — Telephone Encounter (Signed)
Dr. Clark please advise.  

## 2019-05-08 ENCOUNTER — Other Ambulatory Visit: Payer: Self-pay

## 2019-05-08 ENCOUNTER — Other Ambulatory Visit
Admission: RE | Admit: 2019-05-08 | Discharge: 2019-05-08 | Disposition: A | Discharge status: Home / Self Care | Payer: 59 | Source: Ambulatory Visit | Attending: Critical Care Medicine | Admitting: Critical Care Medicine

## 2019-05-08 DIAGNOSIS — Z01812 Encounter for preprocedural laboratory examination: Secondary | ICD-10-CM | POA: Diagnosis not present

## 2019-05-08 DIAGNOSIS — Z20822 Contact with and (suspected) exposure to covid-19: Secondary | ICD-10-CM | POA: Insufficient documentation

## 2019-05-09 LAB — SARS CORONAVIRUS 2 (TAT 6-24 HRS): SARS Coronavirus 2: NEGATIVE

## 2019-05-11 ENCOUNTER — Ambulatory Visit (INDEPENDENT_AMBULATORY_CARE_PROVIDER_SITE_OTHER): Payer: 59 | Admitting: Critical Care Medicine

## 2019-05-11 ENCOUNTER — Other Ambulatory Visit: Payer: Self-pay

## 2019-05-11 DIAGNOSIS — R9389 Abnormal findings on diagnostic imaging of other specified body structures: Secondary | ICD-10-CM

## 2019-05-11 DIAGNOSIS — R053 Chronic cough: Secondary | ICD-10-CM

## 2019-05-11 DIAGNOSIS — R05 Cough: Secondary | ICD-10-CM

## 2019-05-11 LAB — PULMONARY FUNCTION TEST
DL/VA % pred: 74 %
DL/VA: 3.14 ml/min/mmHg/L
DLCO unc % pred: 63 %
DLCO unc: 14.71 ml/min/mmHg
FEF 25-75 Post: 4.12 L/sec
FEF 25-75 Pre: 2.28 L/sec
FEF2575-%Change-Post: 80 %
FEF2575-%Pred-Post: 140 %
FEF2575-%Pred-Pre: 78 %
FEV1-%Change-Post: 19 %
FEV1-%Pred-Post: 98 %
FEV1-%Pred-Pre: 82 %
FEV1-Post: 3.03 L
FEV1-Pre: 2.53 L
FEV1FVC-%Change-Post: 6 %
FEV1FVC-%Pred-Pre: 97 %
FEV6-%Change-Post: 13 %
FEV6-%Pred-Post: 96 %
FEV6-%Pred-Pre: 85 %
FEV6-Post: 3.64 L
FEV6-Pre: 3.22 L
FEV6FVC-%Change-Post: 0 %
FEV6FVC-%Pred-Post: 102 %
FEV6FVC-%Pred-Pre: 102 %
FVC-%Change-Post: 12 %
FVC-%Pred-Post: 93 %
FVC-%Pred-Pre: 83 %
FVC-Post: 3.64 L
FVC-Pre: 3.24 L
Post FEV1/FVC ratio: 83 %
Post FEV6/FVC ratio: 100 %
Pre FEV1/FVC ratio: 78 %
Pre FEV6/FVC Ratio: 99 %
RV % pred: 94 %
RV: 1.82 L
TLC % pred: 94 %
TLC: 5.16 L

## 2019-05-11 NOTE — Progress Notes (Signed)
PFT done today. 

## 2019-05-12 ENCOUNTER — Encounter: Payer: Self-pay | Admitting: Critical Care Medicine

## 2019-05-12 ENCOUNTER — Telehealth (INDEPENDENT_AMBULATORY_CARE_PROVIDER_SITE_OTHER): Payer: 59 | Admitting: Critical Care Medicine

## 2019-05-12 DIAGNOSIS — J4551 Severe persistent asthma with (acute) exacerbation: Secondary | ICD-10-CM | POA: Diagnosis not present

## 2019-05-12 MED ORDER — FLUTICASONE-SALMETEROL 500-50 MCG/DOSE IN AEPB: 1.0000 | INHALATION_SPRAY | Freq: Two times a day (BID) | RESPIRATORY_TRACT | 3 refills | Status: DC

## 2019-05-12 MED ORDER — MONTELUKAST SODIUM 10 MG PO TABS: 10.0000 mg | ORAL_TABLET | Freq: Every day | ORAL | 11 refills | Status: DC

## 2019-05-12 NOTE — Patient Instructions (Addendum)
Thank you for visiting Dr. Chestine Spore at [facility]. We recommend the following: Orders Placed This Encounter  Procedures  . IgE  . CBC w/Diff   Orders Placed This Encounter  Procedures  . IgE    Standing Status:   Future    Standing Expiration Date:   05/11/2020  . CBC w/Diff    Standing Status:   Future    Standing Expiration Date:   05/11/2020   Get labs drawn when you drop off your sputum sample.  Meds ordered this encounter  Medications  . Fluticasone-Salmeterol (ADVAIR DISKUS) 500-50 MCG/DOSE AEPB    Sig: Inhale 1 puff into the lungs 2 (two) times daily.    Dispense:  60 each    Refill:  3  . montelukast (SINGULAIR) 10 MG tablet    Sig: Take 1 tablet (10 mg total) by mouth at bedtime.    Dispense:  30 tablet    Refill:  11     Use albuterol before Advair for now.  Return in about 4 weeks (around 06/09/2019).    Please do your part to reduce the spread of COVID-19.

## 2019-05-12 NOTE — Progress Notes (Signed)
Synopsis: Referred in October 2020 for abnormal CT by Darrow Bussing, MD.  Subjective:   PATIENT ID: Nancy Porter GENDER: female DOB: 1970-07-31, MRN: [id number]  Chief Complaint  Patient presents with  . Follow-up    Patient is doing follow up after PFT on 1/26. Patient has cough with yellow sputum. Patient is finishing antibiotic and steroid at this time.    Virtual Visit via Video Note  I connected with Nancy Porter  on 05/12/19 at  9:30 AM EST by a video enabled telemedicine application and verified that I am speaking with the correct person using two identifiers.  Location: Patient: Home Provider: Office - [facility] - [address] [place], [address], [place], [place] [address]  I discussed the limitations of evaluation and management by telemedicine and the availability of in person appointments. The patient expressed understanding and agreed to proceed. I also discussed with the patient that there may be a patient responsible charge related to this service. The patient expressed understanding and agreed to proceed.  Patient consented to consult via telephone: Yes People present and their role in pt care: Pt    I discussed the assessment and treatment plan with the patient. The patient was provided an opportunity to ask questions and all were answered. The patient agreed with the plan and demonstrated an understanding of the instructions.   The patient was advised to call back or seek an in-person evaluation if the symptoms worsen or if the condition fails to improve as anticipated.    Nancy Porter is a 49 year old woman who presents for follow-up of chronic cough.  She continues to have thick yellow mucus.  She mostly has coughing spells, which precipitated her being unable to catch her breath.  She has had a constant feeling of an elephant sitting on her chest for several weeks.  She feels like she cannot breathe.  She had some improvement of her symptoms with her  prednisone and levofloxacin course prescribed on 1/13, but was outside in her shed and noticed worsening of her symptoms.  Prior to prednisone levofloxacin she had been prescribed a Z-Pak with prednisone on 1/8 without resolution of her symptoms.  She continues to use her Breo daily, and uses her albuterol a few times daily with some improvement in her symptoms..  She coughs a lot at night, but does not wake up from it.  She uses Flonase daily, but has not been on an antihistamine in the past.  She denies uncontrolled rhinorrhea currently.  She quit smoking several months ago and has not been around it at all.     OV 03/27/2019: Nancy Porter is a 49 year old woman who is being evaluated for recurrent bronchitis.  She has recently been treated by her primary care physician twice with antibiotics and prednisone (first with azithromycin for 5 days, next with levofloxacin for 5 days) and both times her symptoms have improved somewhat, but return when she completes her medications.  She is waking up at night with a choking cough.  She has green sputum.  She has noticed dyspnea on exertion associated with this, but denies fever, chills, sweats, GI symptoms, rhinorrhea, and postnasal drip.  She has had some intermittent chest pain across both sides of her chest-sometimes sharp, sometimes dull.  She was tested for Covid multiple times after there were coworkers that had been diagnosed with Covid.  She has been working from home.  Her PFTs are scheduled for next month.  She quit smoking and  has not restarted since she was last seen.  She continues to use Breo once daily, and has been using her albuterol more recently, a few times a day.  She continues to use Flonase twice daily.   OV 01/22/2019: Nancy Porter is a 49 year old woman referred for evaluation of abnormal chest CT and chronic cough.  She has been coughing since March 2020, and it has worsened over time.  Her cough improved somewhat when she took  courses of steroids and antibiotics, but it flared up again afterwards.  She has periods of intractable coughing.  She has sputum they can vary from clear to gray, sometimes with black specks in it.  She denies hemoptysis.  She has no history of heartburn, allergies, rhinorrhea, postnasal drip, hoarseness.  She has a 35-pack-year smoking history, but quit about 2 and half weeks ago.  Since quitting her cough is improved slightly.  She was started on Breo about a month ago but has not noticed much difference.  She has been on albuterol for a few months, but uses it infrequently.  She has no significant history of using electronic cigarettes.  She is not on an ACE inhibitor.  She does a lot of crafts, and is around dust from wood, but denies ceramics dust.  She has no family history of lung disease.  The CT scan of her chest was performed to follow-up and abnormal CT of her abdomen, which was performed to evaluate right lower quadrant abdominal pain.    Past Medical History:  Diagnosis Date  . Anxiety   . Arrhythmia   . Depression   . Deviated septum   . Hx of migraines   . Hx of suicide attempt 2010   overdose on Trileptal, has been on Seroquel in the past  . Hyperlipidemia   . Tobacco use      Family History  Problem Relation Age of Onset  . Glaucoma Mother   . Heart disease Mother   . Heart Problems Mother   . Arrhythmia Mother        fib  . Deep vein thrombosis Father   . Heart Problems Father   . Heart disease Father   . Migraines Sister   . Stroke Maternal Grandmother   . Aneurysm Maternal Grandmother   . Heart disease Maternal Grandmother   . Diabetes Paternal Uncle        adult onset  . CAD Maternal Grandfather      Past Surgical History:  Procedure Laterality Date  . ABDOMINAL HYSTERECTOMY    . BREAST ENHANCEMENT SURGERY    . CESAREAN SECTION     x 2  . SHOULDER SURGERY Left    impingement    Social History   Socioeconomic History  . Marital status: Married     Spouse name: Nancy Porter  . Number of children: 2  . Years of education: some college  . Highest education level: Some college, no degree  Occupational History  . Occupation: Scientist, physiological: [employer]  Tobacco Use  . Smoking status: Former Smoker    Packs/day: 1.00    Years: 35.00    Pack years: 35.00    Types: Cigarettes    Quit date: 01/08/2019    Years since quitting: 0.3  . Smokeless tobacco: Never Used  Substance and Sexual Activity  . Alcohol use: Yes    Comment: occ maybe 1-2 a month  . Drug use: Yes    Types: Marijuana  Comment: cocaine in the past  . Sexual activity: Not on file  Other Topics Concern  . Not on file  Social History Narrative   Patient is left-handed. She lives with her husband and daughter in a 1 story house.  She has not been exercising in the last year.    3 cups coffee daily.   Social Determinants of Health   Financial Resource Strain:   . Difficulty of Paying Living Expenses: Not on file  Food Insecurity:   . Worried About Charity fundraiser in the Last Year: Not on file  . Ran Out of Food in the Last Year: Not on file  Transportation Needs:   . Lack of Transportation (Medical): Not on file  . Lack of Transportation (Non-Medical): Not on file  Physical Activity:   . Days of Exercise per Week: Not on file  . Minutes of Exercise per Session: Not on file  Stress:   . Feeling of Stress : Not on file  Social Connections:   . Frequency of Communication with Friends and Family: Not on file  . Frequency of Social Gatherings with Friends and Family: Not on file  . Attends Religious Services: Not on file  . Active Member of Clubs or Organizations: Not on file  . Attends Archivist Meetings: Not on file  . Marital Status: Not on file  Intimate Partner Violence:   . Fear of Current or Ex-Partner: Not on file  . Emotionally Abused: Not on file  . Physically Abused: Not on file  . Sexually Abused: Not on file      No Known Allergies   Immunization History  Administered Date(s) Administered  . Influenza,inj,Quad PF,6+ Mos 01/22/2019    Outpatient Medications Prior to Visit  Medication Sig Dispense Refill  . albuterol (VENTOLIN HFA) 108 (90 Base) MCG/ACT inhaler Inhale 2 puffs into the lungs every 6 (six) hours as needed for wheezing or shortness of breath. 6.7 g 11  . benzonatate (TESSALON) 200 MG capsule Take 1 capsule (200 mg total) by mouth 3 (three) times daily as needed for cough. 30 capsule 1  . BREO ELLIPTA 100-25 MCG/INH AEPB TAKE 1 PUFF BY MOUTH EVERY DAY    . fluticasone (FLONASE) 50 MCG/ACT nasal spray SPRAY 2 SPRAYS INTO EACH NOSTRIL EVERY DAY 16 mL 1  . gabapentin (NEURONTIN) 100 MG capsule Take 100 mg by mouth 2 (two) times daily.    Marland Kitchen levofloxacin (LEVAQUIN) 750 MG tablet Take 1 tablet (750 mg total) by mouth daily. 7 tablet 0  . nortriptyline (PAMELOR) 25 MG capsule TAKE 1 CAPSULE (25 MG TOTAL) BY MOUTH AT BEDTIME. 90 capsule 3  . oxybutynin (DITROPAN) 5 MG tablet TAKE 1 TABLET BY MOUTH EVERY 6 TO 8 HOURS AS NEEDED FOR URINARY URGENCY/FREQUENCY    . sertraline (ZOLOFT) 100 MG tablet TAKE 1.5 TABLETS BY MOUTH AT BEDTIME FOR ANXIETY    . topiramate (TOPAMAX) 100 MG tablet Take 1 tablet (100 mg total) by mouth 2 (two) times daily. 180 tablet 1  . Ubrogepant (UBRELVY) 50 MG TABS Take 100 mg by mouth every 2 (two) hours as needed. Max 200mg  a day 10 tablet 6  . Erenumab-aooe (AIMOVIG) 140 MG/ML SOAJ Inject 140 mg into the skin every 30 (thirty) days. (Patient not taking: Reported on 05/12/2019) 3 pen 0  . amoxicillin-clavulanate (AUGMENTIN) 875-125 MG tablet Take 1 tablet by mouth 2 (two) times daily. 20 tablet 0  . azithromycin (ZITHROMAX) 250 MG tablet Zpack taper as  directed for acute bronchitis 6 tablet 0  . predniSONE (DELTASONE) 10 MG tablet Take 4 tabs po daily x 2 days; then 3 tabs for 2 days; then 2 tabs for 2 days; then 1 tab for 2 days 20 tablet 0   No facility-administered  medications prior to visit.    Review of Systems  Constitutional: Negative for chills, fever and weight loss.  HENT: Negative.   Respiratory: Positive for cough, sputum production, shortness of breath and wheezing. Negative for hemoptysis.   Cardiovascular: Positive for chest pain. Negative for leg swelling.  Gastrointestinal: Negative for heartburn, nausea and vomiting.  Genitourinary: Negative.   Musculoskeletal: Negative for joint pain and myalgias.  Skin: Negative for rash.  Neurological: Negative for dizziness and focal weakness.       Chronic migraines  Endo/Heme/Allergies: Negative for environmental allergies. Does not bruise/bleed easily.  Psychiatric/Behavioral: Negative.      Objective:   There were no vitals filed for this visit.   on   RA BMI Readings from Last 3 Encounters:  04/07/19 21.89 kg/m  01/22/19 24.59 kg/m  08/21/18 25.53 kg/m   Wt Readings from Last 3 Encounters:  04/07/19 139 lb 12 oz (63.4 kg)  01/22/19 157 lb (71.2 kg)  08/21/18 163 lb (73.9 kg)    Physical Exam-limited due to televisit-no conversational dyspnea.  No significant coughing during encounter.  Normal speech, answering questions appropriately.   CBC    Component Value Date/Time   WBC 11.5 (H) 05/16/2017 1943   RBC 4.72 05/16/2017 1943   HGB 14.6 05/16/2017 1943   HCT 43.6 05/16/2017 1943   PLT 233 05/16/2017 1943   MCV 92.5 05/16/2017 1943   MCH 30.9 05/16/2017 1943   MCHC 33.4 05/16/2017 1943   RDW 13.3 05/16/2017 1943    Sputum culture- Never obtained  Chest Imaging- films reviewed: CT chest 12/17/2018- no pulmonary embolism, no mediastinal or hilar adenopathy.  Minimally dilated distal esophagus.  Generous pulmonary artery, left atrium, ascending aorta. Mosaicism, airway thickening, peripheral tiny centrilobular nodules, few small intraparenchymal cysts.  CXR, 2 view 03/27/2019- airway thickening, no opacities.  Pulmonary Functions Testing Results: PFT Results Latest  Ref Rng & Units 05/11/2019  FVC-Pre L 3.24  FVC-Predicted Pre % 83  FVC-Post L 3.64  FVC-Predicted Post % 93  Pre FEV1/FVC % % 78  Post FEV1/FCV % % 83  FEV1-Pre L 2.53  FEV1-Predicted Pre % 82  FEV1-Post L 3.03  DLCO UNC% % 63  DLCO COR %Predicted % 74  TLC L 5.16  TLC % Predicted % 94  RV % Predicted % 94   No significant obstruction but significant bronchodilator reversibility is present.  No restriction, air trapping, or hyperinflation.  Mildly reduced diffusion capacity.     Assessment & Plan:     ICD-10-CM   1. Severe persistent asthma with acute exacerbation  J45.51 IgE    CBC w/Diff     Severe persistent asthma -Start Advair 500-50 twice daily.  Inhaler technique reviewed, instructed to rinse her mouth after every use.  Stop Breo. -Start Singulair once daily -Continue allergic rhinosinusitis management with Flonase daily. -Checking CBC with differential and IgE level -If she is still expectorating sputum, request that a sample be brought for culture. -Recommend Covid vaccine when it is available to her.  Continue social distancing, handwashing, mask wearing Covid precautions. -Up-to-date on seasonal flu vaccine -Congratulated her on ongoing tobacco cessation  RTC in 1 month to ensure improvement in symptoms.  28 minutes spent on  this encounter, including record review, time spent face-to-face virtually with patient, and charting.   Current Outpatient Medications:  .  albuterol (VENTOLIN HFA) 108 (90 Base) MCG/ACT inhaler, Inhale 2 puffs into the lungs every 6 (six) hours as needed for wheezing or shortness of breath., Disp: 6.7 g, Rfl: 11 .  benzonatate (TESSALON) 200 MG capsule, Take 1 capsule (200 mg total) by mouth 3 (three) times daily as needed for cough., Disp: 30 capsule, Rfl: 1 .  BREO ELLIPTA 100-25 MCG/INH AEPB, TAKE 1 PUFF BY MOUTH EVERY DAY, Disp: , Rfl:  .  fluticasone (FLONASE) 50 MCG/ACT nasal spray, SPRAY 2 SPRAYS INTO EACH NOSTRIL EVERY DAY, Disp:  16 mL, Rfl: 1 .  gabapentin (NEURONTIN) 100 MG capsule, Take 100 mg by mouth 2 (two) times daily., Disp: , Rfl:  .  levofloxacin (LEVAQUIN) 750 MG tablet, Take 1 tablet (750 mg total) by mouth daily., Disp: 7 tablet, Rfl: 0 .  nortriptyline (PAMELOR) 25 MG capsule, TAKE 1 CAPSULE (25 MG TOTAL) BY MOUTH AT BEDTIME., Disp: 90 capsule, Rfl: 3 .  oxybutynin (DITROPAN) 5 MG tablet, TAKE 1 TABLET BY MOUTH EVERY 6 TO 8 HOURS AS NEEDED FOR URINARY URGENCY/FREQUENCY, Disp: , Rfl:  .  sertraline (ZOLOFT) 100 MG tablet, TAKE 1.5 TABLETS BY MOUTH AT BEDTIME FOR ANXIETY, Disp: , Rfl:  .  topiramate (TOPAMAX) 100 MG tablet, Take 1 tablet (100 mg total) by mouth 2 (two) times daily., Disp: 180 tablet, Rfl: 1 .  Ubrogepant (UBRELVY) 50 MG TABS, Take 100 mg by mouth every 2 (two) hours as needed. Max 200mg  a day, Disp: 10 tablet, Rfl: 6 .  Erenumab-aooe (AIMOVIG) 140 MG/ML SOAJ, Inject 140 mg into the skin every 30 (thirty) days. (Patient not taking: Reported on 05/12/2019), Disp: 3 pen, Rfl: 0 .  Fluticasone-Salmeterol (ADVAIR DISKUS) 500-50 MCG/DOSE AEPB, Inhale 1 puff into the lungs 2 (two) times daily., Disp: 60 each, Rfl: 3 .  montelukast (SINGULAIR) 10 MG tablet, Take 1 tablet (10 mg total) by mouth at bedtime., Disp: 30 tablet, Rfl: 11   05/14/2019, DO [facility] 05/12/2019 8:05 PM

## 2019-05-13 ENCOUNTER — Other Ambulatory Visit: Payer: Self-pay | Admitting: Critical Care Medicine

## 2019-05-13 DIAGNOSIS — R05 Cough: Secondary | ICD-10-CM

## 2019-05-13 DIAGNOSIS — R053 Chronic cough: Secondary | ICD-10-CM

## 2019-05-15 ENCOUNTER — Other Ambulatory Visit: Payer: 59

## 2019-05-15 ENCOUNTER — Other Ambulatory Visit: Payer: Self-pay | Admitting: Critical Care Medicine

## 2019-05-15 DIAGNOSIS — R05 Cough: Secondary | ICD-10-CM

## 2019-05-15 DIAGNOSIS — J4551 Severe persistent asthma with (acute) exacerbation: Secondary | ICD-10-CM

## 2019-05-15 DIAGNOSIS — R053 Chronic cough: Secondary | ICD-10-CM

## 2019-05-15 DIAGNOSIS — J209 Acute bronchitis, unspecified: Secondary | ICD-10-CM

## 2019-05-15 DIAGNOSIS — J40 Bronchitis, not specified as acute or chronic: Secondary | ICD-10-CM

## 2019-05-18 ENCOUNTER — Other Ambulatory Visit (INDEPENDENT_AMBULATORY_CARE_PROVIDER_SITE_OTHER): Payer: 59

## 2019-05-18 ENCOUNTER — Encounter: Payer: Self-pay | Admitting: Neurology

## 2019-05-18 ENCOUNTER — Telehealth (INDEPENDENT_AMBULATORY_CARE_PROVIDER_SITE_OTHER): Payer: PRIVATE HEALTH INSURANCE | Admitting: Neurology

## 2019-05-18 ENCOUNTER — Other Ambulatory Visit: Payer: Self-pay | Admitting: Critical Care Medicine

## 2019-05-18 DIAGNOSIS — J4551 Severe persistent asthma with (acute) exacerbation: Secondary | ICD-10-CM | POA: Diagnosis not present

## 2019-05-18 DIAGNOSIS — G43711 Chronic migraine without aura, intractable, with status migrainosus: Secondary | ICD-10-CM

## 2019-05-18 LAB — CBC WITH DIFFERENTIAL/PLATELET
Basophils Absolute: 0.1 10*3/uL (ref 0.0–0.1)
Basophils Relative: 0.8 % (ref 0.0–3.0)
Eosinophils Absolute: 0.7 10*3/uL (ref 0.0–0.7)
Eosinophils Relative: 6.2 % — ABNORMAL HIGH (ref 0.0–5.0)
HCT: 37.1 % (ref 36.0–46.0)
Hemoglobin: 12.4 g/dL (ref 12.0–15.0)
Lymphocytes Relative: 32.4 % (ref 12.0–46.0)
Lymphs Abs: 3.5 10*3/uL (ref 0.7–4.0)
MCHC: 33.5 g/dL (ref 30.0–36.0)
MCV: 94 fl (ref 78.0–100.0)
Monocytes Absolute: 0.6 10*3/uL (ref 0.1–1.0)
Monocytes Relative: 5.4 % (ref 3.0–12.0)
Neutro Abs: 6 10*3/uL (ref 1.4–7.7)
Neutrophils Relative %: 55.2 % (ref 43.0–77.0)
Platelets: 196 10*3/uL (ref 150.0–400.0)
RBC: 3.94 Mil/uL (ref 3.87–5.11)
RDW: 13.4 % (ref 11.5–15.5)
WBC: 10.8 10*3/uL — ABNORMAL HIGH (ref 4.0–10.5)

## 2019-05-18 LAB — RESPIRATORY CULTURE OR RESPIRATORY AND SPUTUM CULTURE
MICRO NUMBER:: 10096230
SPECIMEN QUALITY:: ADEQUATE

## 2019-05-18 MED ORDER — AIMOVIG 140 MG/ML ~~LOC~~ SOAJ: 140.0000 mg | SUBCUTANEOUS | 0 refills | Status: DC

## 2019-05-18 MED ORDER — LEVOFLOXACIN 750 MG PO TABS: 750.0000 mg | ORAL_TABLET | Freq: Every day | ORAL | 0 refills | Status: DC

## 2019-05-18 NOTE — Telephone Encounter (Signed)
Received the following message from patient:  "I have a question about RESP CULT OR RESP AND SPUT CULT resulted on 05/18/19, 9:26 AM.  I would like to speak to the nurse.  Dr. Chestine Spore had requested blood work however, before I come in I would like to discuss the results first.    Thank you, Kanya Potteiger"  She is requesting the results of her sputum culture from 05/15/19. It looks like a cbc and ige was ordered for the patient to complete as well. Dr. Chestine Spore, please advise. Thanks!

## 2019-05-18 NOTE — Progress Notes (Signed)
[facility]    Provider:  Dr Lucia Gaskins Referring Provider: Darrow Bussing, MD Primary Care Physician:  Darrow Bussing, MD  Virtual Visit via Telephone Note  I connected with Fraser Din on 02/03/2019 at  9:30 AM EST by telephone and verified that I am speaking with the correct person using two identifiers.  Location: Patient: Home Provider: Office   I discussed the limitations, risks, security and privacy concerns of performing an evaluation and management service by telephone and the availability of in person appointments. I also discussed with the patient that there may be a patient responsible charge related to this service. The patient expressed understanding and agreed to proceed.   Follow Up Instructions:    I discussed the assessment and treatment plan with the patient. The patient was provided an opportunity to ask questions and all were answered. The patient agreed with the plan and demonstrated an understanding of the instructions.   The patient was advised to call back or seek an in-person evaluation if the symptoms worsen or if the condition fails to improve as anticipated.  I provided 15 minutes of non-face-to-face time during this encounter.   Anson Fret, MD   CC:  Migraine  Interval history 05/18/2019: She has not been on the Aimovig this month, her Bernita Raisin is helping, they have worsened since being off of the Aimovig, She is on so many medications she had to start seeing a pulmonologist for chronic bronchitis and she has emphysema, she has not been smoking for over 3 months now. She feels that has helped her headaches. She also disgnosed with asthma and she is trying to cut down. She has lost 40 pounds, she doesn't feel healthy and she is following with pcp. She can get the Aimovig just fine, she can still use the copay, Aimovig, Aimovig improved headaches and migraines by 80%, she wasn't using her acute management medication, she likes  the [place]. I spent time looking into her copay benefits, she can get the Aimovig if she re-registers for another year of copay savings.    Interval history 02/05/2019: Doing excellent on botox. - Doing excellent on Botox alone. NOT ON CGRP. Continue botox, > 75% improvement in frequency and severity of migraines.  Interval History 08/21/2018: Patient reports that the cgrp has not worked. Sine she stopped the botox and remained on the Aimovig The migraines have returned since she stopped the botox. At this time she is not takng the Aimovig and would like to go back to the botox. DIscussed this, will try and get both botox and cgrp approved but if we cannot then she will stop the cgrp in favor of botox   HPI 12/30/2017:  Nancy Porter is a 49 y.o. female here as requested by Dr. Docia Chuck for migraines. PMHx anxiety, suicide attempt, tobacco use, ADHD. She has had a continuousheadache since July 27th. She has Daily headaches and >15 migraine days a month for over a year but worsening to daily migraines since July 27th. The headache is unilateral, but can be bilateral temporal and periorbital, throbbing/pulsating/pounding, +photo/phonophobia, +nasuea, +vomiting, movement makes it worse, no aura, she takes abortive medication daily, she has no medication overuse, unknown inciting events, she has been to other neurologists, she has been to [facility], she has been to the emergency room and has been admitted. Severe pain. Migraine lasts 24-72 hours. She denies any OTC medication use since July. She even stopped the Benadryl. She describes lots of pressure. No aura. No  medication overuse, no aura. She has been to the eye doctor, she has had imaging that is normal. The episodes are severe. She has associated tingling and some dizziness.   meds tried: butalbital, imitrex, advil, tylenol, excedrin, topamax, benadryl, ativan, DHE, compazine, toradol, zomig, nortriptyline, prednisone, nortriptyline, trazodone  Reviewed  notes, labs and imaging from outside physicians, which showed:  Personally reviewed images on CD patient brought MRI brain and MRA head appears normal  Reviewed notes from headache clinic [facility] Leah PA 12/2017. Migraine for 19 days, intense pressure, light sensitivity, has been to the ED, has seen neurosurgery, taking excedrin and imitrex, daily headache in the forehead and temporal regions, wors ein the afternoons, headaches for 20 years. Neuro exam was normal. Medication overuse was disussed. Also seen prior 8/16 for 3 weeks of throbbing headaches. She is "going insane", headaches 3-4 days a week.   11/20/2017: BUn 11, creatinine 0.59  Review of Systems: Patient complains of symptoms per HPI as well as the following symptoms: headache, cough, . Pertinent negatives and positives per HPI. All others negative.   Social History   Socioeconomic History  . Marital status: Married    Spouse name: Renae Fickle  . Number of children: 2  . Years of education: some college  . Highest education level: Some college, no degree  Occupational History  . Occupation: Scientist, physiological: [employer]  Tobacco Use  . Smoking status: Former Smoker    Packs/day: 1.00    Years: 35.00    Pack years: 35.00    Types: Cigarettes    Quit date: 01/08/2019    Years since quitting: 0.3  . Smokeless tobacco: Never Used  Substance and Sexual Activity  . Alcohol use: Yes    Comment: occ maybe 1-2 a month  . Drug use: Yes    Types: Marijuana    Comment: cocaine in the past  . Sexual activity: Not on file  Other Topics Concern  . Not on file  Social History Narrative   Patient is left-handed. She lives with her husband and daughter in a 1 story house.  She has not been exercising in the last year.    3 cups coffee daily.   Social Determinants of Health   Financial Resource Strain:   . Difficulty of Paying Living Expenses: Not on file  Food Insecurity:   . Worried About Brewing technologist in the Last Year: Not on file  . Ran Out of Food in the Last Year: Not on file  Transportation Needs:   . Lack of Transportation (Medical): Not on file  . Lack of Transportation (Non-Medical): Not on file  Physical Activity:   . Days of Exercise per Week: Not on file  . Minutes of Exercise per Session: Not on file  Stress:   . Feeling of Stress : Not on file  Social Connections:   . Frequency of Communication with Friends and Family: Not on file  . Frequency of Social Gatherings with Friends and Family: Not on file  . Attends Religious Services: Not on file  . Active Member of Clubs or Organizations: Not on file  . Attends Banker Meetings: Not on file  . Marital Status: Not on file  Intimate Partner Violence:   . Fear of Current or Ex-Partner: Not on file  . Emotionally Abused: Not on file  . Physically Abused: Not on file  . Sexually Abused: Not on file    Family History  Problem Relation Age of Onset  . Glaucoma Mother   . Heart disease Mother   . Heart Problems Mother   . Arrhythmia Mother        fib  . Deep vein thrombosis Father   . Heart Problems Father   . Heart disease Father   . Migraines Sister   . Stroke Maternal Grandmother   . Aneurysm Maternal Grandmother   . Heart disease Maternal Grandmother   . Diabetes Paternal Uncle        adult onset  . CAD Maternal Grandfather     Past Medical History:  Diagnosis Date  . Anxiety   . Arrhythmia   . Depression   . Deviated septum   . Hx of migraines   . Hx of suicide attempt 2010   overdose on Trileptal, has been on Seroquel in the past  . Hyperlipidemia   . Tobacco use     Past Surgical History:  Procedure Laterality Date  . ABDOMINAL HYSTERECTOMY    . BREAST ENHANCEMENT SURGERY    . CESAREAN SECTION     x 2  . SHOULDER SURGERY Left    impingement    Current Outpatient Medications  Medication Sig Dispense Refill  . albuterol (VENTOLIN HFA) 108 (90 Base) MCG/ACT inhaler Inhale 2  puffs into the lungs every 6 (six) hours as needed for wheezing or shortness of breath. 6.7 g 11  . benzonatate (TESSALON) 200 MG capsule Take 1 capsule (200 mg total) by mouth 3 (three) times daily as needed for cough. 30 capsule 1  . BREO ELLIPTA 100-25 MCG/INH AEPB TAKE 1 PUFF BY MOUTH EVERY DAY    . Erenumab-aooe (AIMOVIG) 140 MG/ML SOAJ Inject 140 mg into the skin every 30 (thirty) days. 3 pen 0  . fluticasone (FLONASE) 50 MCG/ACT nasal spray SPRAY 2 SPRAYS INTO EACH NOSTRIL EVERY DAY 16 mL 1  . Fluticasone-Salmeterol (ADVAIR DISKUS) 500-50 MCG/DOSE AEPB Inhale 1 puff into the lungs 2 (two) times daily. 60 each 3  . gabapentin (NEURONTIN) 100 MG capsule Take 100 mg by mouth 2 (two) times daily.    Marland Kitchen levofloxacin (LEVAQUIN) 750 MG tablet Take 1 tablet (750 mg total) by mouth daily. 7 tablet 0  . montelukast (SINGULAIR) 10 MG tablet Take 1 tablet (10 mg total) by mouth at bedtime. 30 tablet 11  . nortriptyline (PAMELOR) 25 MG capsule TAKE 1 CAPSULE (25 MG TOTAL) BY MOUTH AT BEDTIME. 90 capsule 3  . oxybutynin (DITROPAN) 5 MG tablet TAKE 1 TABLET BY MOUTH EVERY 6 TO 8 HOURS AS NEEDED FOR URINARY URGENCY/FREQUENCY    . sertraline (ZOLOFT) 100 MG tablet TAKE 1.5 TABLETS BY MOUTH AT BEDTIME FOR ANXIETY    . topiramate (TOPAMAX) 100 MG tablet Take 1 tablet (100 mg total) by mouth 2 (two) times daily. 180 tablet 1  . Ubrogepant (UBRELVY) 50 MG TABS Take 100 mg by mouth every 2 (two) hours as needed. Max 200mg  a day 10 tablet 6   No current facility-administered medications for this visit.    Allergies as of 05/18/2019  . (No Known Allergies)    Vitals: LMP  (LMP Unknown)  Last Weight:  Wt Readings from Last 1 Encounters:  04/07/19 139 lb 12 oz (63.4 kg)   Last Height:   Ht Readings from Last 1 Encounters:  04/07/19 5\' 7"  (1.702 m)    Prior examination: No changes noted over video Physical exam: Exam: Gen: NAD, conversant, well nourised, well groomed  CV: RRR, no  MRG. No Carotid Bruits. No peripheral edema, warm, nontender Eyes: Conjunctivae clear without exudates or hemorrhage  Neuro: Detailed Neurologic Exam  Speech:    Speech is normal; fluent and spontaneous with normal comprehension.  Cognition:    The patient is oriented to person, place, and time;     recent and remote memory intact;     language fluent;     normal attention, concentration,     fund of knowledge Cranial Nerves:    The pupils are equal, round, and reactive to light. The fundi are normal and spontaneous venous pulsations are present. Visual fields are full to finger confrontation. Extraocular movements are intact. Trigeminal sensation is intact and the muscles of mastication are normal. The face is symmetric. The palate elevates in the midline. Hearing intact. Voice is normal. Shoulder shrug is normal. The tongue has normal motion without fasciculations.   Coordination:    Normal finger to nose and heel to shin. Normal rapid alternating movements.   Gait:    Heel-toe and tandem gait are normal.   Motor Observation:    No asymmetry, no atrophy, and no involuntary movements noted. Tone:    Normal muscle tone.    Posture:    Posture is normal. normal erect    Strength:    Strength is V/V in the upper and lower limbs.      Sensation: intact to LT     Reflex Exam:  DTR's:    Deep tendon reflexes in the upper and lower extremities are normal bilaterally.   Toes:    The toes are downgoing bilaterally.   Clonus:    Clonus is absent.       Assessment/Plan:  49 year old with chronic intractable headaches.   Was doingexcellent on botox. x, > 75% improvement in frequency and severity of migraines. However botox declined, she says she is doing well just on Aimovig alone and we will continue  To prevent or relieve headaches, try the following: Cool Compress. Lie down and place a cool compress on your head.  Avoid headache triggers. If certain foods or odors seem  to have triggered your migraines in the past, avoid them. A headache diary might help you identify triggers.  Include physical activity in your daily routine. Try a daily walk or other moderate aerobic exercise.  Manage stress. Find healthy ways to cope with the stressors, such as delegating tasks on your to-do list.  Practice relaxation techniques. Try deep breathing, yoga, massage and visualization.  Eat regularly. Eating regularly scheduled meals and maintaining a healthy diet might help prevent headaches. Also, drink plenty of fluids.  Follow a regular sleep schedule. Sleep deprivation might contribute to headaches Consider biofeedback. With this mind-body technique, you learn to control certain bodily functions -- such as muscle tension, heart rate and blood pressure -- to prevent headaches or reduce headache pain.    Proceed to emergency room if you experience new or worsening symptoms or symptoms do not resolve, if you have new neurologic symptoms or if headache is severe, or for any concerning symptom.   Provided education and documentation from American headache Society toolbox including articles on: chronic migraine medication overuse headache, chronic migraines, prevention of migraines, behavioral and other nonpharmacologic treatments for headache.    Cc: Lujean Amel, MD  Sarina Ill, MD  [facility] [address] [place], [place] [address]  Phone [phone] Fax [phone]-

## 2019-05-18 NOTE — Progress Notes (Signed)
I discussed the results of [name]'s sputum culture and prescribed levofloxacin 750mg  daily x 14 days.   She has not yet had her blood work drawn but related to me that she has had elevated lymphocytes on the past from her PCP and is concerned that this is related to her Pseudomonas infection. She is worried that she either has cancer or an autoimmune disease and has requested labs to be drawn to investigate this. At this point she will get the CBC with diff and IgE level drawn and we will follow up in clinic on 2/23 as scheduled.  3/23, DO 05/18/19 1:00 PM [facility]

## 2019-05-19 LAB — IGE: IgE (Immunoglobulin E), Serum: 48 kU/L (ref <=114)

## 2019-05-21 ENCOUNTER — Telehealth: Payer: Self-pay | Admitting: *Deleted

## 2019-05-21 NOTE — Telephone Encounter (Signed)
Received Bernita Raisin PA. It was completed, signed, and faxed to [facility]. This is a continuation PA. Pt is doing well on medication.

## 2019-05-26 NOTE — Telephone Encounter (Signed)
Received fax from [facility]. Ubrelvy 50 mg approved from 06/16/19-06/15/20. I faxed approval to pt's pharmacy.  Received a receipt of confirmation.

## 2019-06-01 ENCOUNTER — Other Ambulatory Visit: Payer: Self-pay | Admitting: Infectious Diseases

## 2019-06-01 DIAGNOSIS — Z1231 Encounter for screening mammogram for malignant neoplasm of breast: Secondary | ICD-10-CM

## 2019-06-02 ENCOUNTER — Other Ambulatory Visit: Payer: Self-pay | Admitting: Infectious Diseases

## 2019-06-02 DIAGNOSIS — N644 Mastodynia: Secondary | ICD-10-CM

## 2019-06-09 ENCOUNTER — Encounter: Payer: Self-pay | Admitting: Critical Care Medicine

## 2019-06-09 ENCOUNTER — Ambulatory Visit: Payer: 59 | Admitting: Critical Care Medicine

## 2019-06-09 ENCOUNTER — Other Ambulatory Visit: Payer: Self-pay

## 2019-06-09 VITALS — BP 90/58 | HR 64 | Ht 67.0 in | Wt 141.0 lb

## 2019-06-09 DIAGNOSIS — A498 Other bacterial infections of unspecified site: Secondary | ICD-10-CM

## 2019-06-09 DIAGNOSIS — J439 Emphysema, unspecified: Secondary | ICD-10-CM | POA: Diagnosis not present

## 2019-06-09 DIAGNOSIS — J4550 Severe persistent asthma, uncomplicated: Secondary | ICD-10-CM | POA: Diagnosis not present

## 2019-06-09 MED ORDER — FEXOFENADINE HCL 60 MG PO TABS: 60.0000 mg | ORAL_TABLET | Freq: Two times a day (BID) | ORAL | 11 refills | Status: DC

## 2019-06-09 MED ORDER — SPIRIVA RESPIMAT 1.25 MCG/ACT IN AERS: 1.0000 | INHALATION_SPRAY | Freq: Every day | RESPIRATORY_TRACT | 11 refills | Status: DC

## 2019-06-09 NOTE — Progress Notes (Signed)
Synopsis: Referred in October 2020 for abnormal CT by Lujean Amel, MD.  Subjective:   PATIENT ID: Nancy Porter GENDER: female DOB: 09/09/1970, MRN: [id number]  Chief Complaint  Patient presents with  . Follow-up    Patient is here for follow up. Patient states that she still has coughing fits but they have got better. Recently started on B12 with new doctor and yesterday was her first injection. Patient also gave sputum sample yesterday as well. Patient states that she has noticed a difference with the new advair inhaler. Patient has been using rescue inhaler more because she feels winded/short of breath.      Nancy Porter is a 49 year old woman who presents for follow-up with her daughter.  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  She had a recent recurrent pulmonary infections, which was determined to be Pseudomonas when sputum cultures were finally obtained.  She was recently treated with 14 days of levofloxacin.  She was started on high-dose Advair last month with some improvement in her symptoms.  She unfortunately continues to require albuterol about 5 times per day.  She continues on Singulair, Flonase.  She is not taking an antihistamine.  She continues to have shortness of breath and nocturnal wheezing.  She is not waking up at night, but does cough throughout the night according to her family.  She continues to have some sputum production, but less than prior to her 2-week course of levofloxacin.  She produced another sputum culture yesterday for her PCP.  She recently established with Dr. Ola Spurr at [facility] internal medicine-note reviewed from 06/01/2019 visit.  He has started her on B12 injections for her chronic fatigue. She quit smoking in 2020 after 30 pack- years.  She has started wearing a mask now when she is around crafts, which include wood dust.        Televisit 05/12/2019: Nancy Porter is a 49 year old woman who presents for  follow-up of chronic cough.  She continues to have thick yellow mucus.  She mostly has coughing spells, which precipitated her being unable to catch her breath.  She has had a constant feeling of an elephant sitting on her chest for several weeks.  She feels like she cannot breathe.  She had some improvement of her symptoms with her prednisone and levofloxacin course prescribed on 1/13, but was outside in her shed and noticed worsening of her symptoms.  Prior to prednisone levofloxacin she had been prescribed a Z-Pak with prednisone on 1/8 without resolution of her symptoms.  She continues to use her Breo daily, and uses her albuterol a few times daily with some improvement in her symptoms..  She coughs a lot at night, but does not wake up from it.  She uses Flonase daily, but has not been on an antihistamine in the past.  She denies uncontrolled rhinorrhea currently.  She quit smoking several months ago and has not been around it at all.   OV 03/27/2019: Nancy Porter is a 49 year old woman who is being evaluated for recurrent bronchitis.  She has recently been treated by her primary care physician twice with antibiotics and prednisone (first with azithromycin for 5 days, next with levofloxacin for 5 days) and both times her symptoms have improved somewhat, but return when she completes her medications.  She is waking up at night with a choking cough.  She has green sputum.  She has noticed dyspnea on exertion associated with this, but denies fever, chills, sweats, GI  symptoms, rhinorrhea, and postnasal drip.  She has had some intermittent chest pain across both sides of her chest-sometimes sharp, sometimes dull.  She was tested for Covid multiple times after there were coworkers that had been diagnosed with Covid.  She has been working from home.  Her PFTs are scheduled for next month.  She quit smoking and has not restarted since she was last seen.  She continues to use Breo once daily, and has been using  her albuterol more recently, a few times a day.  She continues to use Flonase twice daily.   OV 01/22/2019: Nancy Porter is a 49 year old woman referred for evaluation of abnormal chest CT and chronic cough.  She has been coughing since March 2020, and it has worsened over time.  Her cough improved somewhat when she took courses of steroids and antibiotics, but it flared up again afterwards.  She has periods of intractable coughing.  She has sputum they can vary from clear to gray, sometimes with black specks in it.  She denies hemoptysis.  She has no history of heartburn, allergies, rhinorrhea, postnasal drip, hoarseness.  She has a 35-pack-year smoking history, but quit about 2 and half weeks ago.  Since quitting her cough is improved slightly.  She was started on Breo about a month ago but has not noticed much difference.  She has been on albuterol for a few months, but uses it infrequently.  She has no significant history of using electronic cigarettes.  She is not on an ACE inhibitor.  She does a lot of crafts, and is around dust from wood, but denies ceramics dust.  She has no family history of lung disease.  The CT scan of her chest was performed to follow-up and abnormal CT of her abdomen, which was performed to evaluate right lower quadrant abdominal pain.    Past Medical History:  Diagnosis Date  . Anxiety   . Arrhythmia   . Depression   . Deviated septum   . Hx of migraines   . Hx of suicide attempt 2010   overdose on Trileptal, has been on Seroquel in the past  . Hyperlipidemia   . Tobacco use      Family History  Problem Relation Age of Onset  . Glaucoma Mother   . Heart disease Mother   . Heart Problems Mother   . Arrhythmia Mother        fib  . Deep vein thrombosis Father   . Heart Problems Father   . Heart disease Father   . Migraines Sister   . Stroke Maternal Grandmother   . Aneurysm Maternal Grandmother   . Heart disease Maternal Grandmother   . Diabetes  Paternal Uncle        adult onset  . CAD Maternal Grandfather      Past Surgical History:  Procedure Laterality Date  . ABDOMINAL HYSTERECTOMY    . BREAST ENHANCEMENT SURGERY    . CESAREAN SECTION     x 2  . SHOULDER SURGERY Left    impingement    Social History   Socioeconomic History  . Marital status: Married    Spouse name: Renae Fickle  . Number of children: 2  . Years of education: some college  . Highest education level: Some college, no degree  Occupational History  . Occupation: Scientist, physiological: [employer]  Tobacco Use  . Smoking status: Former Smoker    Packs/day: 1.00    Years: 35.00  Pack years: 35.00    Types: Cigarettes    Quit date: 01/08/2019    Years since quitting: 0.4  . Smokeless tobacco: Never Used  Substance and Sexual Activity  . Alcohol use: Yes    Comment: occ maybe 1-2 a month  . Drug use: Yes    Types: Marijuana    Comment: cocaine in the past  . Sexual activity: Not on file  Other Topics Concern  . Not on file  Social History Narrative   Patient is left-handed. She lives with her husband and daughter in a 1 story house.  She has not been exercising in the last year.    3 cups coffee daily.   Social Determinants of Health   Financial Resource Strain:   . Difficulty of Paying Living Expenses: Not on file  Food Insecurity:   . Worried About Programme researcher, broadcasting/film/videounning Out of Food in the Last Year: Not on file  . Ran Out of Food in the Last Year: Not on file  Transportation Needs:   . Lack of Transportation (Medical): Not on file  . Lack of Transportation (Non-Medical): Not on file  Physical Activity:   . Days of Exercise per Week: Not on file  . Minutes of Exercise per Session: Not on file  Stress:   . Feeling of Stress : Not on file  Social Connections:   . Frequency of Communication with Friends and Family: Not on file  . Frequency of Social Gatherings with Friends and Family: Not on file  . Attends Religious Services: Not  on file  . Active Member of Clubs or Organizations: Not on file  . Attends BankerClub or Organization Meetings: Not on file  . Marital Status: Not on file  Intimate Partner Violence:   . Fear of Current or Ex-Partner: Not on file  . Emotionally Abused: Not on file  . Physically Abused: Not on file  . Sexually Abused: Not on file     No Known Allergies   Immunization History  Administered Date(s) Administered  . Influenza,inj,Quad PF,6+ Mos 01/22/2019    Outpatient Medications Prior to Visit  Medication Sig Dispense Refill  . albuterol (VENTOLIN HFA) 108 (90 Base) MCG/ACT inhaler Inhale 2 puffs into the lungs every 6 (six) hours as needed for wheezing or shortness of breath. 6.7 g 11  . benzonatate (TESSALON) 200 MG capsule Take 1 capsule (200 mg total) by mouth 3 (three) times daily as needed for cough. 30 capsule 1  . Erenumab-aooe (AIMOVIG) 140 MG/ML SOAJ Inject 140 mg into the skin every 30 (thirty) days. 3 pen 0  . fluticasone (FLONASE) 50 MCG/ACT nasal spray SPRAY 2 SPRAYS INTO EACH NOSTRIL EVERY DAY 16 mL 1  . Fluticasone-Salmeterol (ADVAIR DISKUS) 500-50 MCG/DOSE AEPB Inhale 1 puff into the lungs 2 (two) times daily. 60 each 3  . gabapentin (NEURONTIN) 100 MG capsule Take 100 mg by mouth 2 (two) times daily.    Marland Kitchen. levofloxacin (LEVAQUIN) 750 MG tablet Take 1 tablet (750 mg total) by mouth daily. 14 tablet 0  . montelukast (SINGULAIR) 10 MG tablet Take 1 tablet (10 mg total) by mouth at bedtime. 30 tablet 11  . nortriptyline (PAMELOR) 25 MG capsule TAKE 1 CAPSULE (25 MG TOTAL) BY MOUTH AT BEDTIME. 90 capsule 3  . oxybutynin (DITROPAN) 5 MG tablet TAKE 1 TABLET BY MOUTH EVERY 6 TO 8 HOURS AS NEEDED FOR URINARY URGENCY/FREQUENCY    . sertraline (ZOLOFT) 100 MG tablet TAKE 1.5 TABLETS BY MOUTH AT BEDTIME FOR ANXIETY    .  topiramate (TOPAMAX) 100 MG tablet Take 1 tablet (100 mg total) by mouth 2 (two) times daily. 180 tablet 1  . Ubrogepant (UBRELVY) 50 MG TABS Take 100 mg by mouth every 2  (two) hours as needed. Max 200mg  a day 10 tablet 6  . BREO ELLIPTA 100-25 MCG/INH AEPB TAKE 1 PUFF BY MOUTH EVERY DAY     No facility-administered medications prior to visit.    Review of Systems  Constitutional: Negative for chills, fever and weight loss.  HENT: Negative.   Respiratory: Positive for cough, sputum production, shortness of breath and wheezing. Negative for hemoptysis.   Cardiovascular: Positive for chest pain. Negative for leg swelling.  Gastrointestinal: Negative for heartburn, nausea and vomiting.  Genitourinary: Negative.   Musculoskeletal: Negative for joint pain and myalgias.  Skin: Negative for rash.  Neurological: Negative for dizziness and focal weakness.       Chronic migraines  Endo/Heme/Allergies: Negative for environmental allergies. Does not bruise/bleed easily.  Psychiatric/Behavioral: Negative.      Objective:   Vitals:   06/09/19 0950  BP: (!) 90/58  Pulse: 64  SpO2: 100%  Weight: 141 lb (64 kg)  Height: 5\' 7"  (1.702 m)   100% on   RA BMI Readings from Last 3 Encounters:  06/09/19 22.08 kg/m  04/07/19 21.89 kg/m  01/22/19 24.59 kg/m   Wt Readings from Last 3 Encounters:  06/09/19 141 lb (64 kg)  04/07/19 139 lb 12 oz (63.4 kg)  01/22/19 157 lb (71.2 kg)    Physical Exam Vitals reviewed.  Constitutional:      Appearance: Normal appearance. She is not ill-appearing.  HENT:     Head: Normocephalic and atraumatic.  Eyes:     General: No scleral icterus. Cardiovascular:     Rate and Rhythm: Normal rate and regular rhythm.     Heart sounds: No murmur.  Pulmonary:     Comments: Breathing comfortably on room air, no conversational dyspnea.  Clear to auscultation bilaterally.  No witnessed coughing. Abdominal:     General: There is no distension.     Palpations: Abdomen is soft.  Musculoskeletal:        General: No swelling or deformity.     Cervical back: Neck supple.  Lymphadenopathy:     Cervical: No cervical adenopathy.   Skin:    General: Skin is warm and dry.     Findings: No rash.  Neurological:     Mental Status: She is alert.     Coordination: Coordination normal.  Psychiatric:        Mood and Affect: Mood normal.        Behavior: Behavior normal.       CBC    Component Value Date/Time   WBC 10.8 (H) 05/18/2019 1425   RBC 3.94 05/18/2019 1425   HGB 12.4 05/18/2019 1425   HCT 37.1 05/18/2019 1425   PLT 196.0 05/18/2019 1425   MCV 94.0 05/18/2019 1425   MCH 30.9 05/16/2017 1943   MCHC 33.5 05/18/2019 1425   RDW 13.4 05/18/2019 1425   LYMPHSABS 3.5 05/18/2019 1425   MONOABS 0.6 05/18/2019 1425   EOSABS 0.7 05/18/2019 1425   BASOSABS 0.1 05/18/2019 1425   05/18/2019 labs: IgE 48 Eosinophils 700  05/15/2019 sputum culture- Pseudomonas, pansensitive  Chest Imaging- films reviewed: CT chest 12/17/2018- no pulmonary embolism, no mediastinal or hilar adenopathy.  Minimally dilated distal esophagus.  Generous pulmonary artery, left atrium, ascending aorta. Mosaicism, airway thickening, peripheral tiny centrilobular nodules, few small intraparenchymal cysts.  CXR, 2 view 03/27/2019- airway thickening, no opacities.  Pulmonary Functions Testing Results: PFT Results Latest Ref Rng & Units 05/11/2019  FVC-Pre L 3.24  FVC-Predicted Pre % 83  FVC-Post L 3.64  FVC-Predicted Post % 93  Pre FEV1/FVC % % 78  Post FEV1/FCV % % 83  FEV1-Pre L 2.53  FEV1-Predicted Pre % 82  FEV1-Post L 3.03  DLCO UNC% % 63  DLCO COR %Predicted % 74  TLC L 5.16  TLC % Predicted % 94  RV % Predicted % 94   No significant obstruction but significant bronchodilator reversibility is present.  No restriction, air trapping, or hyperinflation.  Mildly reduced diffusion capacity.     Assessment & Plan:     ICD-10-CM   1. Pseudomonas infection  A49.8   2. Severe persistent asthma without complication  J45.50 Spirometry with Graph  3. Pulmonary emphysema, unspecified emphysema type (HCC)  J43.9 Spirometry with Graph      Severe persistent asthma vs asthma- COPD overlap -Continue Advair 500-50 twice daily.  Adding Spiriva once daily. -Continue Singulair and Allegra once daily -Continue Flonase daily. -Sputum culture collected yesterday to evaluate for clearance of Pseudomonas.  Would like to treat for cure given the comorbidities associated with colonization chronically. -Recommend Covid vaccine when it is available to her. Continue social distancing, handwashing, screening   RTC in 1 month.   Current Outpatient Medications:  .  albuterol (VENTOLIN HFA) 108 (90 Base) MCG/ACT inhaler, Inhale 2 puffs into the lungs every 6 (six) hours as needed for wheezing or shortness of breath., Disp: 6.7 g, Rfl: 11 .  benzonatate (TESSALON) 200 MG capsule, Take 1 capsule (200 mg total) by mouth 3 (three) times daily as needed for cough., Disp: 30 capsule, Rfl: 1 .  Erenumab-aooe (AIMOVIG) 140 MG/ML SOAJ, Inject 140 mg into the skin every 30 (thirty) days., Disp: 3 pen, Rfl: 0 .  fluticasone (FLONASE) 50 MCG/ACT nasal spray, SPRAY 2 SPRAYS INTO EACH NOSTRIL EVERY DAY, Disp: 16 mL, Rfl: 1 .  Fluticasone-Salmeterol (ADVAIR DISKUS) 500-50 MCG/DOSE AEPB, Inhale 1 puff into the lungs 2 (two) times daily., Disp: 60 each, Rfl: 3 .  gabapentin (NEURONTIN) 100 MG capsule, Take 100 mg by mouth 2 (two) times daily., Disp: , Rfl:  .  levofloxacin (LEVAQUIN) 750 MG tablet, Take 1 tablet (750 mg total) by mouth daily., Disp: 14 tablet, Rfl: 0 .  montelukast (SINGULAIR) 10 MG tablet, Take 1 tablet (10 mg total) by mouth at bedtime., Disp: 30 tablet, Rfl: 11 .  nortriptyline (PAMELOR) 25 MG capsule, TAKE 1 CAPSULE (25 MG TOTAL) BY MOUTH AT BEDTIME., Disp: 90 capsule, Rfl: 3 .  oxybutynin (DITROPAN) 5 MG tablet, TAKE 1 TABLET BY MOUTH EVERY 6 TO 8 HOURS AS NEEDED FOR URINARY URGENCY/FREQUENCY, Disp: , Rfl:  .  sertraline (ZOLOFT) 100 MG tablet, TAKE 1.5 TABLETS BY MOUTH AT BEDTIME FOR ANXIETY, Disp: , Rfl:  .  topiramate (TOPAMAX) 100  MG tablet, Take 1 tablet (100 mg total) by mouth 2 (two) times daily., Disp: 180 tablet, Rfl: 1 .  Ubrogepant (UBRELVY) 50 MG TABS, Take 100 mg by mouth every 2 (two) hours as needed. Max 200mg  a day, Disp: 10 tablet, Rfl: 6 .  fexofenadine (ALLEGRA ALLERGY) 60 MG tablet, Take 1 tablet (60 mg total) by mouth 2 (two) times daily., Disp: 60 tablet, Rfl: 11 .  Tiotropium Bromide Monohydrate (SPIRIVA RESPIMAT) 1.25 MCG/ACT AERS, Inhale 1 puff into the lungs daily., Disp: 4 g, Rfl: 11   Vernona Rieger  Audrie Lia, DO [facility] 06/09/2019 7:22 PM

## 2019-06-09 NOTE — Patient Instructions (Addendum)
Thank you for visiting Dr. Chestine Spore at [facility]. We recommend the following: Orders Placed This Encounter  Procedures  . Spirometry with Graph   Orders Placed This Encounter  Procedures  . Spirometry with Graph    Standing Status:   Future    Standing Expiration Date:   06/08/2020    Order Specific Question:   Where should this test be performed?    Answer:   [facility]    Meds ordered this encounter  Medications  . fexofenadine (ALLEGRA ALLERGY) 60 MG tablet    Sig: Take 1 tablet (60 mg total) by mouth 2 (two) times daily.    Dispense:  60 tablet    Refill:  11  . Tiotropium Bromide Monohydrate (SPIRIVA RESPIMAT) 1.25 MCG/ACT AERS    Sig: Inhale 1 puff into the lungs daily.    Dispense:  4 g    Refill:  11    Return in about 4 weeks (around 07/07/2019).    Please do your part to reduce the spread of COVID-19.

## 2019-06-12 ENCOUNTER — Ambulatory Visit: Payer: 59 | Admitting: Internal Medicine

## 2019-06-16 ENCOUNTER — Other Ambulatory Visit: Payer: 59

## 2019-07-02 ENCOUNTER — Ambulatory Visit: Payer: 59 | Admitting: Critical Care Medicine

## 2019-07-02 NOTE — Progress Notes (Deleted)
Synopsis: Referred in October 2020 for abnormal CT by Darrow Bussing, MD.  Subjective:   PATIENT ID: Nancy Porter GENDER: female DOB: 05-18-1970, MRN: [id number]  No chief complaint on file.    HPI    Pseudomonas- levofloxacin Copd vs asthma?  Continue Advair 500-50 twice daily.  Adding Spiriva once daily. -Continue Singulair and Allegra once daily -Continue Flonase daily.  06/22/19 PCP visit- checking AFB sputums? Collected? Still having sputum production Quit smoking  Spirometry in may   b12 deficiency  OV 06/09/19: Nancy Porter is a 49 year old woman who presents for follow-up with her daughter.  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  She had a recent recurrent pulmonary infections, which was determined to be Pseudomonas when sputum cultures were finally obtained.  She was recently treated with 14 days of levofloxacin.  She was started on high-dose Advair last month with some improvement in her symptoms.  She unfortunately continues to require albuterol about 5 times per day.  She continues on Singulair, Flonase.  She is not taking an antihistamine.  She continues to have shortness of breath and nocturnal wheezing.  She is not waking up at night, but does cough throughout the night according to her family.  She continues to have some sputum production, but less than prior to her 2-week course of levofloxacin.  She produced another sputum culture yesterday for her PCP.  She recently established with Dr. Sampson Goon at [facility] internal medicine-note reviewed from 06/01/2019 visit.  He has started her on B12 injections for her chronic fatigue. She quit smoking in 2020 after 30 pack- years.  She has started wearing a mask now when she is around crafts, which include wood dust.     Televisit 05/12/2019: Nancy Porter is a 49 year old woman who presents for follow-up of chronic cough.  She continues to have thick yellow mucus.  She mostly has  coughing spells, which precipitated her being unable to catch her breath.  She has had a constant feeling of an elephant sitting on her chest for several weeks.  She feels like she cannot breathe.  She had some improvement of her symptoms with her prednisone and levofloxacin course prescribed on 1/13, but was outside in her shed and noticed worsening of her symptoms.  Prior to prednisone levofloxacin she had been prescribed a Z-Pak with prednisone on 1/8 without resolution of her symptoms.  She continues to use her Breo daily, and uses her albuterol a few times daily with some improvement in her symptoms..  She coughs a lot at night, but does not wake up from it.  She uses Flonase daily, but has not been on an antihistamine in the past.  She denies uncontrolled rhinorrhea currently.  She quit smoking several months ago and has not been around it at all.   OV 03/27/2019: Nancy Porter is a 49 year old woman who is being evaluated for recurrent bronchitis.  She has recently been treated by her primary care physician twice with antibiotics and prednisone (first with azithromycin for 5 days, next with levofloxacin for 5 days) and both times her symptoms have improved somewhat, but return when she completes her medications.  She is waking up at night with a choking cough.  She has green sputum.  She has noticed dyspnea on exertion associated with this, but denies fever, chills, sweats, GI symptoms, rhinorrhea, and postnasal drip.  She has had some intermittent chest pain across both sides of her chest-sometimes sharp, sometimes dull.  She  was tested for Covid multiple times after there were coworkers that had been diagnosed with Covid.  She has been working from home.  Her PFTs are scheduled for next month.  She quit smoking and has not restarted since she was last seen.  She continues to use Breo once daily, and has been using her albuterol more recently, a few times a day.  She continues to use Flonase twice  daily.   OV 01/22/2019: Nancy Porter is a 49 year old woman referred for evaluation of abnormal chest CT and chronic cough.  She has been coughing since March 2020, and it has worsened over time.  Her cough improved somewhat when she took courses of steroids and antibiotics, but it flared up again afterwards.  She has periods of intractable coughing.  She has sputum they can vary from clear to gray, sometimes with black specks in it.  She denies hemoptysis.  She has no history of heartburn, allergies, rhinorrhea, postnasal drip, hoarseness.  She has a 35-pack-year smoking history, but quit about 2 and half weeks ago.  Since quitting her cough is improved slightly.  She was started on Breo about a month ago but has not noticed much difference.  She has been on albuterol for a few months, but uses it infrequently.  She has no significant history of using electronic cigarettes.  She is not on an ACE inhibitor.  She does a lot of crafts, and is around dust from wood, but denies ceramics dust.  She has no family history of lung disease.  The CT scan of her chest was performed to follow-up and abnormal CT of her abdomen, which was performed to evaluate right lower quadrant abdominal pain.    Past Medical History:  Diagnosis Date  . Anxiety   . Arrhythmia   . Depression   . Deviated septum   . Hx of migraines   . Hx of suicide attempt 2010   overdose on Trileptal, has been on Seroquel in the past  . Hyperlipidemia   . Tobacco use      Family History  Problem Relation Age of Onset  . Glaucoma Mother   . Heart disease Mother   . Heart Problems Mother   . Arrhythmia Mother        fib  . Deep vein thrombosis Father   . Heart Problems Father   . Heart disease Father   . Migraines Sister   . Stroke Maternal Grandmother   . Aneurysm Maternal Grandmother   . Heart disease Maternal Grandmother   . Diabetes Paternal Uncle        adult onset  . CAD Maternal Grandfather      Past Surgical  History:  Procedure Laterality Date  . ABDOMINAL HYSTERECTOMY    . BREAST ENHANCEMENT SURGERY    . CESAREAN SECTION     x 2  . SHOULDER SURGERY Left    impingement    Social History   Socioeconomic History  . Marital status: Married    Spouse name: Renae Fickle  . Number of children: 2  . Years of education: some college  . Highest education level: Some college, no degree  Occupational History  . Occupation: Scientist, physiological: [employer]  Tobacco Use  . Smoking status: Former Smoker    Packs/day: 1.00    Years: 35.00    Pack years: 35.00    Types: Cigarettes    Quit date: 01/08/2019    Years since quitting: 0.4  .  Smokeless tobacco: Never Used  Substance and Sexual Activity  . Alcohol use: Yes    Comment: occ maybe 1-2 a month  . Drug use: Yes    Types: Marijuana    Comment: cocaine in the past  . Sexual activity: Not on file  Other Topics Concern  . Not on file  Social History Narrative   Patient is left-handed. She lives with her husband and daughter in a 1 story house.  She has not been exercising in the last year.    3 cups coffee daily.   Social Determinants of Health   Financial Resource Strain:   . Difficulty of Paying Living Expenses:   Food Insecurity:   . Worried About Charity fundraiser in the Last Year:   . Arboriculturist in the Last Year:   Transportation Needs:   . Film/video editor (Medical):   Marland Kitchen Lack of Transportation (Non-Medical):   Physical Activity:   . Days of Exercise per Week:   . Minutes of Exercise per Session:   Stress:   . Feeling of Stress :   Social Connections:   . Frequency of Communication with Friends and Family:   . Frequency of Social Gatherings with Friends and Family:   . Attends Religious Services:   . Active Member of Clubs or Organizations:   . Attends Archivist Meetings:   Marland Kitchen Marital Status:   Intimate Partner Violence:   . Fear of Current or Ex-Partner:   . Emotionally  Abused:   Marland Kitchen Physically Abused:   . Sexually Abused:      No Known Allergies   Immunization History  Administered Date(s) Administered  . Influenza,inj,Quad PF,6+ Mos 01/22/2019    Outpatient Medications Prior to Visit  Medication Sig Dispense Refill  . albuterol (VENTOLIN HFA) 108 (90 Base) MCG/ACT inhaler Inhale 2 puffs into the lungs every 6 (six) hours as needed for wheezing or shortness of breath. 6.7 g 11  . benzonatate (TESSALON) 200 MG capsule Take 1 capsule (200 mg total) by mouth 3 (three) times daily as needed for cough. 30 capsule 1  . Erenumab-aooe (AIMOVIG) 140 MG/ML SOAJ Inject 140 mg into the skin every 30 (thirty) days. 3 pen 0  . fexofenadine (ALLEGRA ALLERGY) 60 MG tablet Take 1 tablet (60 mg total) by mouth 2 (two) times daily. 60 tablet 11  . fluticasone (FLONASE) 50 MCG/ACT nasal spray SPRAY 2 SPRAYS INTO EACH NOSTRIL EVERY DAY 16 mL 1  . Fluticasone-Salmeterol (ADVAIR DISKUS) 500-50 MCG/DOSE AEPB Inhale 1 puff into the lungs 2 (two) times daily. 60 each 3  . gabapentin (NEURONTIN) 100 MG capsule Take 100 mg by mouth 2 (two) times daily.    Marland Kitchen levofloxacin (LEVAQUIN) 750 MG tablet Take 1 tablet (750 mg total) by mouth daily. 14 tablet 0  . montelukast (SINGULAIR) 10 MG tablet Take 1 tablet (10 mg total) by mouth at bedtime. 30 tablet 11  . nortriptyline (PAMELOR) 25 MG capsule TAKE 1 CAPSULE (25 MG TOTAL) BY MOUTH AT BEDTIME. 90 capsule 3  . oxybutynin (DITROPAN) 5 MG tablet TAKE 1 TABLET BY MOUTH EVERY 6 TO 8 HOURS AS NEEDED FOR URINARY URGENCY/FREQUENCY    . sertraline (ZOLOFT) 100 MG tablet TAKE 1.5 TABLETS BY MOUTH AT BEDTIME FOR ANXIETY    . Tiotropium Bromide Monohydrate (SPIRIVA RESPIMAT) 1.25 MCG/ACT AERS Inhale 1 puff into the lungs daily. 4 g 11  . topiramate (TOPAMAX) 100 MG tablet Take 1 tablet (100 mg total) by  mouth 2 (two) times daily. 180 tablet 1  . Ubrogepant (UBRELVY) 50 MG TABS Take 100 mg by mouth every 2 (two) hours as needed. Max 200mg  a day 10  tablet 6   No facility-administered medications prior to visit.    Review of Systems  Constitutional: Negative for chills, fever and weight loss.  HENT: Negative.   Respiratory: Positive for cough, sputum production, shortness of breath and wheezing. Negative for hemoptysis.   Cardiovascular: Positive for chest pain. Negative for leg swelling.  Gastrointestinal: Negative for heartburn, nausea and vomiting.  Genitourinary: Negative.   Musculoskeletal: Negative for joint pain and myalgias.  Skin: Negative for rash.  Neurological: Negative for dizziness and focal weakness.       Chronic migraines  Endo/Heme/Allergies: Negative for environmental allergies. Does not bruise/bleed easily.  Psychiatric/Behavioral: Negative.      Objective:   There were no vitals filed for this visit.   on   RA BMI Readings from Last 3 Encounters:  06/09/19 22.08 kg/m  04/07/19 21.89 kg/m  01/22/19 24.59 kg/m   Wt Readings from Last 3 Encounters:  06/09/19 141 lb (64 kg)  04/07/19 139 lb 12 oz (63.4 kg)  01/22/19 157 lb (71.2 kg)    Physical Exam    CBC    Component Value Date/Time   WBC 10.8 (H) 05/18/2019 1425   RBC 3.94 05/18/2019 1425   HGB 12.4 05/18/2019 1425   HCT 37.1 05/18/2019 1425   PLT 196.0 05/18/2019 1425   MCV 94.0 05/18/2019 1425   MCH 30.9 05/16/2017 1943   MCHC 33.5 05/18/2019 1425   RDW 13.4 05/18/2019 1425   LYMPHSABS 3.5 05/18/2019 1425   MONOABS 0.6 05/18/2019 1425   EOSABS 0.7 05/18/2019 1425   BASOSABS 0.1 05/18/2019 1425   05/18/2019 labs: IgE 48 Eosinophils 700  05/15/2019 sputum culture- Pseudomonas, pansensitive  Chest Imaging- films reviewed: CT chest 12/17/2018- no pulmonary embolism, no mediastinal or hilar adenopathy.  Minimally dilated distal esophagus.  Generous pulmonary artery, left atrium, ascending aorta. Mosaicism, airway thickening, peripheral tiny centrilobular nodules, few small intraparenchymal cysts.  CXR, 2 view 03/27/2019- airway  thickening, no opacities.  Pulmonary Functions Testing Results: PFT Results Latest Ref Rng & Units 05/11/2019  FVC-Pre L 3.24  FVC-Predicted Pre % 83  FVC-Post L 3.64  FVC-Predicted Post % 93  Pre FEV1/FVC % % 78  Post FEV1/FCV % % 83  FEV1-Pre L 2.53  FEV1-Predicted Pre % 82  FEV1-Post L 3.03  DLCO UNC% % 63  DLCO COR %Predicted % 74  TLC L 5.16  TLC % Predicted % 94  RV % Predicted % 94   No significant obstruction but significant bronchodilator reversibility is present.  No restriction, air trapping, or hyperinflation.  Mildly reduced diffusion capacity.     Assessment & Plan:   No diagnosis found.   Severe persistent asthma vs asthma- COPD overlap -Continue Advair 500-50 twice daily.  Adding Spiriva once daily. -Continue Singulair and Allegra once daily -Continue Flonase daily. -Sputum culture collected yesterday to evaluate for clearance of Pseudomonas.  Would like to treat for cure given the comorbidities associated with colonization chronically. -Recommend Covid vaccine when it is available to her. Continue social distancing, handwashing, screening   RTC in 1 month.   Current Outpatient Medications:  .  albuterol (VENTOLIN HFA) 108 (90 Base) MCG/ACT inhaler, Inhale 2 puffs into the lungs every 6 (six) hours as needed for wheezing or shortness of breath., Disp: 6.7 g, Rfl: 11 .  benzonatate (TESSALON)  200 MG capsule, Take 1 capsule (200 mg total) by mouth 3 (three) times daily as needed for cough., Disp: 30 capsule, Rfl: 1 .  Erenumab-aooe (AIMOVIG) 140 MG/ML SOAJ, Inject 140 mg into the skin every 30 (thirty) days., Disp: 3 pen, Rfl: 0 .  fexofenadine (ALLEGRA ALLERGY) 60 MG tablet, Take 1 tablet (60 mg total) by mouth 2 (two) times daily., Disp: 60 tablet, Rfl: 11 .  fluticasone (FLONASE) 50 MCG/ACT nasal spray, SPRAY 2 SPRAYS INTO EACH NOSTRIL EVERY DAY, Disp: 16 mL, Rfl: 1 .  Fluticasone-Salmeterol (ADVAIR DISKUS) 500-50 MCG/DOSE AEPB, Inhale 1 puff into the lungs  2 (two) times daily., Disp: 60 each, Rfl: 3 .  gabapentin (NEURONTIN) 100 MG capsule, Take 100 mg by mouth 2 (two) times daily., Disp: , Rfl:  .  levofloxacin (LEVAQUIN) 750 MG tablet, Take 1 tablet (750 mg total) by mouth daily., Disp: 14 tablet, Rfl: 0 .  montelukast (SINGULAIR) 10 MG tablet, Take 1 tablet (10 mg total) by mouth at bedtime., Disp: 30 tablet, Rfl: 11 .  nortriptyline (PAMELOR) 25 MG capsule, TAKE 1 CAPSULE (25 MG TOTAL) BY MOUTH AT BEDTIME., Disp: 90 capsule, Rfl: 3 .  oxybutynin (DITROPAN) 5 MG tablet, TAKE 1 TABLET BY MOUTH EVERY 6 TO 8 HOURS AS NEEDED FOR URINARY URGENCY/FREQUENCY, Disp: , Rfl:  .  sertraline (ZOLOFT) 100 MG tablet, TAKE 1.5 TABLETS BY MOUTH AT BEDTIME FOR ANXIETY, Disp: , Rfl:  .  Tiotropium Bromide Monohydrate (SPIRIVA RESPIMAT) 1.25 MCG/ACT AERS, Inhale 1 puff into the lungs daily., Disp: 4 g, Rfl: 11 .  topiramate (TOPAMAX) 100 MG tablet, Take 1 tablet (100 mg total) by mouth 2 (two) times daily., Disp: 180 tablet, Rfl: 1 .  Ubrogepant (UBRELVY) 50 MG TABS, Take 100 mg by mouth every 2 (two) hours as needed. Max 200mg  a day, Disp: 10 tablet, Rfl: 6   , DO [facility] 07/02/2019 8:42 AM

## 2019-07-23 ENCOUNTER — Other Ambulatory Visit: Payer: Self-pay | Admitting: Neurology

## 2019-08-04 ENCOUNTER — Emergency Department: Payer: 59

## 2019-08-04 ENCOUNTER — Encounter: Payer: Self-pay | Admitting: Emergency Medicine

## 2019-08-04 ENCOUNTER — Emergency Department
Admission: EM | Admit: 2019-08-04 | Discharge: 2019-08-04 | Disposition: A | Discharge status: Home / Self Care | Payer: 59 | Attending: Emergency Medicine | Admitting: Emergency Medicine

## 2019-08-04 ENCOUNTER — Other Ambulatory Visit: Payer: Self-pay

## 2019-08-04 DIAGNOSIS — R079 Chest pain, unspecified: Secondary | ICD-10-CM | POA: Insufficient documentation

## 2019-08-04 DIAGNOSIS — Z5321 Procedure and treatment not carried out due to patient leaving prior to being seen by health care provider: Secondary | ICD-10-CM | POA: Diagnosis not present

## 2019-08-04 LAB — COMPREHENSIVE METABOLIC PANEL
ALT: 18 U/L (ref 0–44)
AST: 26 U/L (ref 15–41)
Albumin: 4.1 g/dL (ref 3.5–5.0)
Alkaline Phosphatase: 54 U/L (ref 38–126)
Anion gap: 7 (ref 5–15)
BUN: 14 mg/dL (ref 6–20)
CO2: 24 mmol/L (ref 22–32)
Calcium: 9.2 mg/dL (ref 8.9–10.3)
Chloride: 108 mmol/L (ref 98–111)
Creatinine, Ser: 0.93 mg/dL (ref 0.44–1.00)
GFR calc Af Amer: >60 mL/min (ref >60)
GFR calc non Af Amer: >60 mL/min (ref >60)
Glucose, Bld: 100 mg/dL — ABNORMAL HIGH (ref 70–99)
Potassium: 3.5 mmol/L (ref 3.5–5.1)
Sodium: 139 mmol/L (ref 135–145)
Total Bilirubin: 0.6 mg/dL (ref 0.3–1.2)
Total Protein: 7.2 g/dL (ref 6.5–8.1)

## 2019-08-04 LAB — CBC WITH DIFFERENTIAL/PLATELET
Abs Immature Granulocytes: 0.02 10*3/uL (ref 0.00–0.07)
Basophils Absolute: 0.1 10*3/uL (ref 0.0–0.1)
Basophils Relative: 1 %
Eosinophils Absolute: 0.6 10*3/uL — ABNORMAL HIGH (ref 0.0–0.5)
Eosinophils Relative: 6 %
HCT: 40.1 % (ref 36.0–46.0)
Hemoglobin: 13.5 g/dL (ref 12.0–15.0)
Immature Granulocytes: 0 %
Lymphocytes Relative: 36 %
Lymphs Abs: 3.6 10*3/uL (ref 0.7–4.0)
MCH: 31.5 pg (ref 26.0–34.0)
MCHC: 33.7 g/dL (ref 30.0–36.0)
MCV: 93.5 fL (ref 80.0–100.0)
Monocytes Absolute: 0.6 10*3/uL (ref 0.1–1.0)
Monocytes Relative: 6 %
Neutro Abs: 5 10*3/uL (ref 1.7–7.7)
Neutrophils Relative %: 51 %
Platelets: 250 10*3/uL (ref 150–400)
RBC: 4.29 MIL/uL (ref 3.87–5.11)
RDW: 12.7 % (ref 11.5–15.5)
WBC: 9.9 10*3/uL (ref 4.0–10.5)
nRBC: 0 % (ref 0.0–0.2)

## 2019-08-04 LAB — TROPONIN I (HIGH SENSITIVITY): Troponin I (High Sensitivity): <2 ng/L (ref <18)

## 2019-08-04 NOTE — ED Triage Notes (Signed)
Patient ambulatory to triage with steady gait, without difficulty or distress noted, mask in place; pt st "I have these episodes where I have pains on left side of head and body and my eye will droop and I will drool and something felt like it jumped from my heart"; st symptoms have been reoccuring for last year but has been told "nothing"; pt c/o persistent weakness at this time

## 2019-08-14 ENCOUNTER — Ambulatory Visit
Admission: RE | Admit: 2019-08-14 | Discharge: 2019-08-14 | Disposition: A | Discharge status: Home / Self Care | Payer: 59 | Source: Ambulatory Visit | Attending: Infectious Diseases | Admitting: Infectious Diseases

## 2019-08-14 ENCOUNTER — Other Ambulatory Visit: Payer: Self-pay | Admitting: Infectious Diseases

## 2019-08-14 DIAGNOSIS — N644 Mastodynia: Secondary | ICD-10-CM | POA: Diagnosis not present

## 2019-08-17 ENCOUNTER — Other Ambulatory Visit
Admission: RE | Admit: 2019-08-17 | Discharge: 2019-08-17 | Disposition: A | Discharge status: Home / Self Care | Payer: 59 | Source: Ambulatory Visit | Attending: Critical Care Medicine | Admitting: Critical Care Medicine

## 2019-08-17 ENCOUNTER — Other Ambulatory Visit: Payer: Self-pay

## 2019-08-17 DIAGNOSIS — Z01812 Encounter for preprocedural laboratory examination: Secondary | ICD-10-CM | POA: Diagnosis present

## 2019-08-17 DIAGNOSIS — Z20822 Contact with and (suspected) exposure to covid-19: Secondary | ICD-10-CM | POA: Diagnosis not present

## 2019-08-17 LAB — SARS CORONAVIRUS 2 (TAT 6-24 HRS): SARS Coronavirus 2: NEGATIVE

## 2019-08-18 ENCOUNTER — Other Ambulatory Visit: Payer: Self-pay | Admitting: Infectious Diseases

## 2019-08-18 DIAGNOSIS — R928 Other abnormal and inconclusive findings on diagnostic imaging of breast: Secondary | ICD-10-CM

## 2019-08-19 ENCOUNTER — Other Ambulatory Visit: Payer: Self-pay | Admitting: Critical Care Medicine

## 2019-08-19 DIAGNOSIS — R053 Chronic cough: Secondary | ICD-10-CM

## 2019-08-19 DIAGNOSIS — R05 Cough: Secondary | ICD-10-CM

## 2019-08-19 DIAGNOSIS — J439 Emphysema, unspecified: Secondary | ICD-10-CM

## 2019-08-19 HISTORY — PX: BREAST BIOPSY: SHX20

## 2019-08-20 ENCOUNTER — Ambulatory Visit
Admission: RE | Admit: 2019-08-20 | Discharge: 2019-08-20 | Disposition: A | Discharge status: Home / Self Care | Payer: 59 | Source: Ambulatory Visit | Attending: Infectious Diseases | Admitting: Infectious Diseases

## 2019-08-20 DIAGNOSIS — R928 Other abnormal and inconclusive findings on diagnostic imaging of breast: Secondary | ICD-10-CM | POA: Insufficient documentation

## 2019-09-14 ENCOUNTER — Emergency Department (HOSPITAL_COMMUNITY): Payer: 59

## 2019-09-14 ENCOUNTER — Other Ambulatory Visit: Payer: Self-pay | Admitting: Critical Care Medicine

## 2019-09-14 ENCOUNTER — Encounter (HOSPITAL_COMMUNITY): Payer: Self-pay | Admitting: *Deleted

## 2019-09-14 ENCOUNTER — Inpatient Hospital Stay (HOSPITAL_COMMUNITY)
Admission: EM | Admit: 2019-09-14 | Discharge: 2019-09-16 | DRG: 880 | Disposition: A | Discharge status: Home / Self Care | Payer: 59 | Attending: Internal Medicine | Admitting: Internal Medicine

## 2019-09-14 ENCOUNTER — Other Ambulatory Visit: Payer: Self-pay

## 2019-09-14 DIAGNOSIS — Z7951 Long term (current) use of inhaled steroids: Secondary | ICD-10-CM

## 2019-09-14 DIAGNOSIS — Z823 Family history of stroke: Secondary | ICD-10-CM | POA: Diagnosis not present

## 2019-09-14 DIAGNOSIS — Z7289 Other problems related to lifestyle: Secondary | ICD-10-CM | POA: Diagnosis not present

## 2019-09-14 DIAGNOSIS — Z8249 Family history of ischemic heart disease and other diseases of the circulatory system: Secondary | ICD-10-CM | POA: Diagnosis not present

## 2019-09-14 DIAGNOSIS — Z90710 Acquired absence of both cervix and uterus: Secondary | ICD-10-CM | POA: Diagnosis not present

## 2019-09-14 DIAGNOSIS — F1290 Cannabis use, unspecified, uncomplicated: Secondary | ICD-10-CM | POA: Diagnosis present

## 2019-09-14 DIAGNOSIS — F445 Conversion disorder with seizures or convulsions: Principal | ICD-10-CM | POA: Diagnosis present

## 2019-09-14 DIAGNOSIS — R4182 Altered mental status, unspecified: Secondary | ICD-10-CM | POA: Diagnosis present

## 2019-09-14 DIAGNOSIS — R29810 Facial weakness: Secondary | ICD-10-CM | POA: Diagnosis present

## 2019-09-14 DIAGNOSIS — Z79899 Other long term (current) drug therapy: Secondary | ICD-10-CM | POA: Diagnosis not present

## 2019-09-14 DIAGNOSIS — F319 Bipolar disorder, unspecified: Secondary | ICD-10-CM | POA: Diagnosis present

## 2019-09-14 DIAGNOSIS — G43711 Chronic migraine without aura, intractable, with status migrainosus: Secondary | ICD-10-CM | POA: Diagnosis present

## 2019-09-14 DIAGNOSIS — E785 Hyperlipidemia, unspecified: Secondary | ICD-10-CM | POA: Diagnosis present

## 2019-09-14 DIAGNOSIS — Z716 Tobacco abuse counseling: Secondary | ICD-10-CM

## 2019-09-14 DIAGNOSIS — F419 Anxiety disorder, unspecified: Secondary | ICD-10-CM | POA: Diagnosis present

## 2019-09-14 DIAGNOSIS — Z915 Personal history of self-harm: Secondary | ICD-10-CM | POA: Diagnosis not present

## 2019-09-14 DIAGNOSIS — R200 Anesthesia of skin: Secondary | ICD-10-CM | POA: Diagnosis present

## 2019-09-14 DIAGNOSIS — R4781 Slurred speech: Secondary | ICD-10-CM | POA: Diagnosis present

## 2019-09-14 DIAGNOSIS — F909 Attention-deficit hyperactivity disorder, unspecified type: Secondary | ICD-10-CM | POA: Diagnosis present

## 2019-09-14 DIAGNOSIS — F329 Major depressive disorder, single episode, unspecified: Secondary | ICD-10-CM | POA: Diagnosis not present

## 2019-09-14 DIAGNOSIS — Z720 Tobacco use: Secondary | ICD-10-CM

## 2019-09-14 DIAGNOSIS — Z20822 Contact with and (suspected) exposure to covid-19: Secondary | ICD-10-CM | POA: Diagnosis present

## 2019-09-14 DIAGNOSIS — R569 Unspecified convulsions: Secondary | ICD-10-CM | POA: Diagnosis not present

## 2019-09-14 DIAGNOSIS — F17210 Nicotine dependence, cigarettes, uncomplicated: Secondary | ICD-10-CM | POA: Diagnosis present

## 2019-09-14 DIAGNOSIS — R471 Dysarthria and anarthria: Secondary | ICD-10-CM | POA: Diagnosis present

## 2019-09-14 DIAGNOSIS — F32A Depression, unspecified: Secondary | ICD-10-CM

## 2019-09-14 LAB — COMPREHENSIVE METABOLIC PANEL
ALT: 17 U/L (ref 0–44)
AST: 20 U/L (ref 15–41)
Albumin: 3.9 g/dL (ref 3.5–5.0)
Alkaline Phosphatase: 51 U/L (ref 38–126)
Anion gap: 8 (ref 5–15)
BUN: 13 mg/dL (ref 6–20)
CO2: 23 mmol/L (ref 22–32)
Calcium: 9.3 mg/dL (ref 8.9–10.3)
Chloride: 111 mmol/L (ref 98–111)
Creatinine, Ser: 1.03 mg/dL — ABNORMAL HIGH (ref 0.44–1.00)
GFR calc Af Amer: >60 mL/min (ref >60)
GFR calc non Af Amer: >60 mL/min (ref >60)
Glucose, Bld: 89 mg/dL (ref 70–99)
Potassium: 3.5 mmol/L (ref 3.5–5.1)
Sodium: 142 mmol/L (ref 135–145)
Total Bilirubin: 0.6 mg/dL (ref 0.3–1.2)
Total Protein: 6.7 g/dL (ref 6.5–8.1)

## 2019-09-14 LAB — URINALYSIS, ROUTINE W REFLEX MICROSCOPIC
Bilirubin Urine: NEGATIVE
Glucose, UA: NEGATIVE mg/dL
Hgb urine dipstick: NEGATIVE
Ketones, ur: NEGATIVE mg/dL
Leukocytes,Ua: NEGATIVE
Nitrite: NEGATIVE
Protein, ur: NEGATIVE mg/dL
Specific Gravity, Urine: 1.011 (ref 1.005–1.030)
pH: 6 (ref 5.0–8.0)

## 2019-09-14 LAB — CBC
HCT: 38.6 % (ref 36.0–46.0)
Hemoglobin: 12.6 g/dL (ref 12.0–15.0)
MCH: 30.9 pg (ref 26.0–34.0)
MCHC: 32.6 g/dL (ref 30.0–36.0)
MCV: 94.6 fL (ref 80.0–100.0)
Platelets: 236 10*3/uL (ref 150–400)
RBC: 4.08 MIL/uL (ref 3.87–5.11)
RDW: 12.4 % (ref 11.5–15.5)
WBC: 11.4 10*3/uL — ABNORMAL HIGH (ref 4.0–10.5)
nRBC: 0 % (ref 0.0–0.2)

## 2019-09-14 LAB — APTT: aPTT: 29 seconds (ref 24–36)

## 2019-09-14 LAB — DIFFERENTIAL
Abs Immature Granulocytes: 0.03 10*3/uL (ref 0.00–0.07)
Basophils Absolute: 0.1 10*3/uL (ref 0.0–0.1)
Basophils Relative: 1 %
Eosinophils Absolute: 0.2 10*3/uL (ref 0.0–0.5)
Eosinophils Relative: 2 %
Immature Granulocytes: 0 %
Lymphocytes Relative: 27 %
Lymphs Abs: 3.1 10*3/uL (ref 0.7–4.0)
Monocytes Absolute: 0.8 10*3/uL (ref 0.1–1.0)
Monocytes Relative: 7 %
Neutro Abs: 7.3 10*3/uL (ref 1.7–7.7)
Neutrophils Relative %: 63 %

## 2019-09-14 LAB — RAPID URINE DRUG SCREEN, HOSP PERFORMED
Amphetamines: NOT DETECTED
Barbiturates: NOT DETECTED
Benzodiazepines: POSITIVE — AB
Cocaine: NOT DETECTED
Opiates: NOT DETECTED
Tetrahydrocannabinol: POSITIVE — AB

## 2019-09-14 LAB — ETHANOL: Alcohol, Ethyl (B): <10 mg/dL (ref <10)

## 2019-09-14 LAB — I-STAT CHEM 8, ED
BUN: 14 mg/dL (ref 6–20)
Calcium, Ion: 1.23 mmol/L (ref 1.15–1.40)
Chloride: 109 mmol/L (ref 98–111)
Creatinine, Ser: 1 mg/dL (ref 0.44–1.00)
Glucose, Bld: 83 mg/dL (ref 70–99)
HCT: 37 % (ref 36.0–46.0)
Hemoglobin: 12.6 g/dL (ref 12.0–15.0)
Potassium: 3.4 mmol/L — ABNORMAL LOW (ref 3.5–5.1)
Sodium: 141 mmol/L (ref 135–145)
TCO2: 22 mmol/L (ref 22–32)

## 2019-09-14 LAB — PROTIME-INR
INR: 1 (ref 0.8–1.2)
Prothrombin Time: 12.6 seconds (ref 11.4–15.2)

## 2019-09-14 LAB — I-STAT BETA HCG BLOOD, ED (MC, WL, AP ONLY): I-stat hCG, quantitative: <5 m[IU]/mL (ref <5)

## 2019-09-14 LAB — SARS CORONAVIRUS 2 BY RT PCR (HOSPITAL ORDER, PERFORMED IN ~~LOC~~ HOSPITAL LAB): SARS Coronavirus 2: NEGATIVE

## 2019-09-14 LAB — CBG MONITORING, ED: Glucose-Capillary: 91 mg/dL (ref 70–99)

## 2019-09-14 MED ORDER — ACETAMINOPHEN 325 MG PO TABS
650.0000 mg | ORAL_TABLET | ORAL | Status: DC | PRN
  Administered 2019-09-15 – 2019-09-16 (×2): 650 mg via ORAL
  Filled 2019-09-14 (×2): qty 2

## 2019-09-14 MED ORDER — ENOXAPARIN SODIUM 40 MG/0.4ML ~~LOC~~ SOLN
40.0000 mg | SUBCUTANEOUS | Status: DC
  Administered 2019-09-15 (×2): 40 mg via SUBCUTANEOUS
  Filled 2019-09-14 (×2): qty 0.4

## 2019-09-14 MED ORDER — ONDANSETRON HCL 4 MG PO TABS: 4.0000 mg | ORAL_TABLET | Freq: Four times a day (QID) | ORAL | Status: DC | PRN

## 2019-09-14 MED ORDER — LORAZEPAM 0.5 MG PO TABS
0.5000 mg | ORAL_TABLET | Freq: Two times a day (BID) | ORAL | Status: DC | PRN
  Administered 2019-09-15 – 2019-09-16 (×2): 0.5 mg via ORAL
  Filled 2019-09-14 (×3): qty 1

## 2019-09-14 MED ORDER — NICOTINE 14 MG/24HR TD PT24: 14.0000 mg | MEDICATED_PATCH | Freq: Every day | TRANSDERMAL | Status: DC

## 2019-09-14 NOTE — Hospital Course (Addendum)
Nancy Porter is a 49 yo CF with PMH migraines (~20 years), tobacco use, HLD, depression/anxiety, hx suicide attempt who presents to the hospital after a complexly described episode at home. History is provided by the patient and her husband who is bedside in the ER.  They describe the episode as a questionable headache, numbness on her right face, twitching of her right eye, contraction of her right eye almost squinting.  She also states that she feels "electrical storms" going from "temple to temple" which feels like "impulses".  She also states that it smells like a "burning smell".  She does note that she was recently started on nortriptyline and some of the symptoms have previously gotten better.  She also takes Topamax for her migraine control which she states also helps.  Regarding today's episode, she denies any tongue biting and husband also states there was no incontinence of urine or stool.  He states that after this episode she was extremely weak all over and her speech was slurred.  He is unsure how long this lasted as he says their daughter accompanied the patient to the ER. This is also approximately the third or fourth time this has happened over the past 1.5 months.  She had recently contacted her neurologist outpatient who stated that she needed another neurology referral given her new symptoms not related to migraines.  She states that her last episode was approximately last weekend with similar symptoms but today was worse, prompting her husband to call EMS. She denies any recent cold-like symptoms nor GI illnesses, nor sick contacts. She is alert and oriented x4 during exam however her affect is flat at times.  She appears to hesitate recalling some details and husband was also unable to clarify during these times.  When asked to elaborate on some of her previous symptoms over the past month, she was also unable to do so, such as stating whether or not she had an associated headache with the  symptoms.  She did however deny any visual changes during these episodes.  Medication bottles reviewed bedside, she is on lorazepam outpatient and also verified on the database.  She takes this consistently and daily without missing doses nor going prolonged periods without medication.  She also denied any significant alcohol use and states that she drinks "rarely".  Due to her dysarthria and weakness, she was evaluated for stroke while in the ER with neurology evaluation.  MRI brain as well as MRA head were negative for acute stroke nor vascular abnormalities. She was recommended for admission and further neuro work-up with EEG.  She was also recommended for a psych consult if work-up was negative given ambiguity of her presentation as well.

## 2019-09-14 NOTE — Consult Note (Signed)
Referring Physician: Dr. Kathrynn Humble    Chief Complaint: Seizure-like spells x 3 today. States she had 2 new-onset seizures this month that she did not seek medical attention for.   HPI: Nancy Porter is an 49 y.o. female who presents from home as a Code Stroke after a seizure-like spell at home today, witnessed by family, followed by 2 additional seizure-like episodes while en route with EMS. Prior to the first seizure, she experienced acute onset of dysarthria at about 4 PM. Per family, the first seizure occurred at about 4:30 PM and lasted for 60-90 seconds. On EMS arrival, she was conversant with a right facial droop. The 2 seizures occurring en route were stereotyped, starting with bilateral flexion of her arms to her chest followed by head turning upwards and to the left with left upwards conjugate eye deviation. The first seizure seen by EMS aborted spontaneously after 60-90 seconds. The second seizure was aborted with 2 mg IV Versed. On arrival to the ED the patient appeared postictal/sedated. No further seizure-like activity has been seen.    LSN: 4 PM tPA Given: No: Seizure as the most likely etiology for her presentation. Risks of tPA significantly outweigh potential benefits.   Past Medical History:  Diagnosis Date  . Anxiety   . Arrhythmia   . Depression   . Deviated septum   . Hx of migraines   . Hx of suicide attempt 2010   overdose on Trileptal, has been on Seroquel in the past  . Hyperlipidemia   . Tobacco use     Past Surgical History:  Procedure Laterality Date  . ABDOMINAL HYSTERECTOMY    . AUGMENTATION MAMMAPLASTY Bilateral    implants 30 years ago-saline  . BREAST BIOPSY Right 08/19/2019   Affirm bx-"coil" clip-path pending  . BREAST ENHANCEMENT SURGERY    . CESAREAN SECTION     x 2  . SHOULDER SURGERY Left    impingement    Family History  Problem Relation Age of Onset  . Glaucoma Mother   . Heart disease Mother   . Heart Problems Mother   . Arrhythmia  Mother        fib  . Deep vein thrombosis Father   . Heart Problems Father   . Heart disease Father   . Migraines Sister   . Stroke Maternal Grandmother   . Aneurysm Maternal Grandmother   . Heart disease Maternal Grandmother   . Diabetes Paternal Uncle        adult onset  . CAD Maternal Grandfather    Social History:  reports that she quit smoking about 8 months ago. Her smoking use included cigarettes. She has a 35.00 pack-year smoking history. She has never used smokeless tobacco. She reports current alcohol use. She reports current drug use. Drug: Marijuana.  Allergies: No Known Allergies  Home Medications:  No current facility-administered medications on file prior to encounter.   Current Outpatient Medications on File Prior to Encounter  Medication Sig Dispense Refill  . albuterol (VENTOLIN HFA) 108 (90 Base) MCG/ACT inhaler Inhale 2 puffs into the lungs every 6 (six) hours as needed for wheezing or shortness of breath. 6.7 g 11  . benzonatate (TESSALON) 200 MG capsule Take 1 capsule (200 mg total) by mouth 3 (three) times daily as needed for cough. 30 capsule 1  . Erenumab-aooe (AIMOVIG) 140 MG/ML SOAJ Inject 140 mg into the skin every 30 (thirty) days. 3 pen 0  . fexofenadine (ALLEGRA ALLERGY) 60 MG tablet Take 1 tablet (  60 mg total) by mouth 2 (two) times daily. 60 tablet 11  . fluticasone (FLONASE) 50 MCG/ACT nasal spray SPRAY 2 SPRAYS INTO EACH NOSTRIL EVERY DAY 16 mL 1  . Fluticasone-Salmeterol (ADVAIR DISKUS) 500-50 MCG/DOSE AEPB Inhale 1 puff into the lungs 2 (two) times daily. 60 each 3  . gabapentin (NEURONTIN) 100 MG capsule Take 100 mg by mouth 2 (two) times daily.    Marland Kitchen levofloxacin (LEVAQUIN) 750 MG tablet Take 1 tablet (750 mg total) by mouth daily. 14 tablet 0  . montelukast (SINGULAIR) 10 MG tablet Take 1 tablet (10 mg total) by mouth at bedtime. 30 tablet 11  . nortriptyline (PAMELOR) 25 MG capsule TAKE 1 CAPSULE (25 MG TOTAL) BY MOUTH AT BEDTIME. 90 capsule 3   . oxybutynin (DITROPAN) 5 MG tablet TAKE 1 TABLET BY MOUTH EVERY 6 TO 8 HOURS AS NEEDED FOR URINARY URGENCY/FREQUENCY    . sertraline (ZOLOFT) 100 MG tablet TAKE 1.5 TABLETS BY MOUTH AT BEDTIME FOR ANXIETY    . Tiotropium Bromide Monohydrate (SPIRIVA RESPIMAT) 1.25 MCG/ACT AERS Inhale 1 puff into the lungs daily. 4 g 11  . topiramate (TOPAMAX) 100 MG tablet TAKE 1 TABLET BY MOUTH TWICE A DAY 180 tablet 0  . Ubrogepant (UBRELVY) 50 MG TABS Take 100 mg by mouth every 2 (two) hours as needed. Max 200mg  a day 10 tablet 6    ROS: Unable to obtain a detailed ROS due to sedation/postictal state.    Physical Examination: Blood pressure 100/81, pulse 74, resp. rate (!) 160, weight 63.2 kg.  HEENT: [place]/AT Lungs: Respirations unlabored Ext: No edema  Neurologic Examination: Mental Status: Sedated/postictal. Speech slow, sparse and dysarthric with increased latency of verbal responses. No errors of grammar or syntax. Slow to follow commands. Naming intact. Partially oriented to location and time.  Cranial Nerves: II:  Does not cooperate with visual field testing. PERRL.  III,IV, VI: Both eyes are half-closed. Will not fully open to command. Eyes are conjugate and able to briefly track to the left and right. No nystagmus.  V,VII: Does not fully grimace and has effortful affect when asked to do so. Temp sensation equal bilaterally.  VIII: Hearing intact to voice IX,X: Does no open mouth sufficiently for evaluation of palate.  XI: Head rotated slightly to the right.  XII: Does not protrude tongue to command, but will touch tongue to back of teeth with an effortful affect.  Motor: RUE with inconsistent strength and movement. At times flaccid and at others will briefly resist examiner and raise antigravity. Intermittent coarse non-physiological appearing tremor of RUE. With dropped-arm test her RUE moves away from her face. LUE with poor effort and wavering 4 to 5/5 strength.  RLE elevates antigravity  about 2 inches off bed with patient expressing a dysthymic affect. Will not resist consistently with hip flexion. Will resist with 4+/5 strength to knee extension. Inconsistent ADF, initially not moving to command, then with 5/5 strength.  LLE elevates antigravity and resists examiner with 5/5 strength.  Briskly withdraws bilateral lower extremities to noxious plantar stimulation without any asymmetry.  Sensory: Subjectively decreased temp sensation to LUE and LLE. FT intact x 4. No extinction to DSS. Deep Tendon Reflexes:  2+ bilateral brachioradialis and biceps 3+ bilateral patellae 2+ bilateral achilles Toes downgoing bilaterally  Cerebellar: No ataxia with FNF on the left. With RUE attempt at [facility], she does not complete the maneuver with accuracy, but no gross ataxia is noted.  Gait: Deferred  Results for orders placed or  performed during the hospital encounter of 09/14/19 (from the past 48 hour(s))  CBG monitoring, ED     Status: None   Collection Time: 09/14/19  4:59 PM  Result Value Ref Range   Glucose-Capillary 91 70 - 99 mg/dL    Comment: Glucose reference range applies only to samples taken after fasting for at least 8 hours.  Protime-INR     Status: None   Collection Time: 09/14/19  5:00 PM  Result Value Ref Range   Prothrombin Time 12.6 11.4 - 15.2 seconds   INR 1.0 0.8 - 1.2    Comment: (NOTE) INR goal varies based on device and disease states. Performed at [facility], [address] [place], [place] [address]   APTT     Status: None   Collection Time: 09/14/19  5:00 PM  Result Value Ref Range   aPTT 29 24 - 36 seconds    Comment: Performed at [facility], [address], [place], [place] [address]  CBC     Status: Abnormal   Collection Time: 09/14/19  5:00 PM  Result Value Ref Range   WBC 11.4 (H) 4.0 - 10.5 K/uL   RBC 4.08 3.87 - 5.11 MIL/uL   Hemoglobin 12.6 12.0 - 15.0 g/dL   HCT 97.9 15.0 - 41.3 %   MCV 94.6 80.0 - 100.0 fL   MCH 30.9 26.0 -  34.0 pg   MCHC 32.6 30.0 - 36.0 g/dL   RDW 64.3 83.7 - 79.3 %   Platelets 236 150 - 400 K/uL   nRBC 0.0 0.0 - 0.2 %    Comment: Performed at [facility], [address] [place], [place] [address]  Differential     Status: None   Collection Time: 09/14/19  5:00 PM  Result Value Ref Range   Neutrophils Relative % 63 %   Neutro Abs 7.3 1.7 - 7.7 K/uL   Lymphocytes Relative 27 %   Lymphs Abs 3.1 0.7 - 4.0 K/uL   Monocytes Relative 7 %   Monocytes Absolute 0.8 0.1 - 1.0 K/uL   Eosinophils Relative 2 %   Eosinophils Absolute 0.2 0.0 - 0.5 K/uL   Basophils Relative 1 %   Basophils Absolute 0.1 0.0 - 0.1 K/uL   Immature Granulocytes 0 %   Abs Immature Granulocytes 0.03 0.00 - 0.07 K/uL    Comment: Performed at [facility], [address], [place], [place] [address]  I-Stat beta hCG blood, ED     Status: None   Collection Time: 09/14/19  5:06 PM  Result Value Ref Range   I-stat hCG, quantitative <5.0 <5 mIU/mL   Comment 3            Comment:   GEST. AGE      CONC.  (mIU/mL)   <=1 WEEK        5 - 50     2 WEEKS       50 - 500     3 WEEKS       100 - 10,000     4 WEEKS     1,000 - 30,000        FEMALE AND NON-PREGNANT FEMALE:     LESS THAN 5 mIU/mL   I-stat chem 8, ED     Status: Abnormal   Collection Time: 09/14/19  5:10 PM  Result Value Ref Range   Sodium 141 135 - 145 mmol/L   Potassium 3.4 (L) 3.5 -  5.1 mmol/L   Chloride 109 98 - 111 mmol/L   BUN 14 6 - 20 mg/dL   Creatinine, Ser 0.98 0.44 - 1.00 mg/dL   Glucose, Bld 83 70 - 99 mg/dL    Comment: Glucose reference range applies only to samples taken after fasting for at least 8 hours.   Calcium, Ion 1.23 1.15 - 1.40 mmol/L   TCO2 22 22 - 32 mmol/L   Hemoglobin 12.6 12.0 - 15.0 g/dL   HCT 11.9 14.7 - 82.9 %   CT HEAD CODE STROKE WO CONTRAST  Result Date: 09/14/2019 CLINICAL DATA:  Code stroke. Slurred speech facial droop. Neuro deficit. EXAM: CT HEAD WITHOUT CONTRAST TECHNIQUE: Contiguous axial images were  obtained from the base of the skull through the vertex without intravenous contrast. COMPARISON:  None. FINDINGS: Brain: No evidence of acute infarction, hemorrhage, hydrocephalus, extra-axial collection or mass lesion/mass effect. Vascular: Negative for hyperdense vessel Skull: Negative Sinuses/Orbits: Paranasal sinuses clear. Mastoid clear bilaterally. Negative orbit bilaterally. Other: None ASPECTS (Alberta Stroke Program Early CT Score) - Ganglionic level infarction (caudate, lentiform nuclei, internal capsule, insula, M1-M3 cortex): 7 - Supraganglionic infarction (M4-M6 cortex): 3 Total score (0-10 with 10 being normal): 10 IMPRESSION: 1. Negative CT head 2. ASPECTS is 10 3. Results texted to Dr. Otelia Limes Electronically Signed   By: Marlan Palau M.D.   On: 09/14/2019 17:13    Assessment: 49 y.o. female presenting with seizure-like spells x 3 today. Received 2 mg IV  Ativan en route. No known prior history of seizures, but states that she had 2 similar spells previously this month. She denies withdrawing from EtOH or benzodiazepines.  1. Neurological exam with multiple inconsistencies and functional components. However, her cognitive evaluation is consistent with either sedation, postictal state or both.  2. Prior history of suicide attempt.  3. History of anxiety and depression.  4. DDx for presentation includes seizure with postictal state, stroke followed by seizure, conversion disorder and factitious disorder/secondary gain.   Recommendations: 1. STAT MRI and MRA of brain.  2. EEG. 3. Frequent neuro checks. 4. Inpatient seizure precautions. 5. May need a psychology consult following above work up, if results are negative.   @Electronically  signed: Dr. 09/14/2019, 5:34 PM

## 2019-09-14 NOTE — Assessment & Plan Note (Addendum)
-  Patient has described episodes over the past 1 to 2 months of possible seizure-like activity consisting of shaking and altered mentation, such as with today's event; however, she also has history of severe migraines (on nortriptyline, topamax, and PRN Bernita Raisin). Concerning is her inability to recall certain aspects of her symptoms that would be expected as well as an ambiguous neuro exam that was not consistent (varying levels of right sided weakness when exam repeated, and complete right side paresthesia). Differential includes seizure vs stroke (ruled out with negative MRI/A) vs complex migraines vs conversion or other psychiatric disorder -Evaluated by neurology in the ER, appreciate assistance -Obtain EEG -Continue neurochecks every 4 hours -Continue on telemetry - check B1 and B12 levels

## 2019-09-14 NOTE — ED Notes (Signed)
1721 PT transported to MRI Dept

## 2019-09-14 NOTE — Assessment & Plan Note (Signed)
-  Await med rec, then resume home meds

## 2019-09-14 NOTE — H&P (Addendum)
History and Physical    Nancy Porter  NLZ:[id number]  DOB: 1970-08-28  DOA: 09/14/2019  PCP: Leonel Ramsay, MD Patient coming from: home  Chief Complaint: AMS, shaking  HPI:  Ms. Nancy Porter is a 49 yo CF with PMH migraines (~20 years), tobacco use, HLD, depression/anxiety, hx suicide attempt who presents to the hospital after a complexly described episode at home. History is provided by the patient and her husband who is bedside in the ER.  They describe the episode as a questionable headache, numbness on her right face, twitching of her right eye, contraction of her right eye almost squinting.  She also states that she feels "electrical storms" going from "temple to temple" which feels like "impulses".  She also states that it smells like a "burning smell".  She does note that she was recently started on nortriptyline and some of the symptoms have previously gotten better.  She also takes Topamax for her migraine control which she states also helps.  Regarding today's episode, she denies any tongue biting and husband also states there was no incontinence of urine or stool.  He states that after this episode she was extremely weak all over and her speech was slurred.  He is unsure how long this lasted as he says their daughter accompanied the patient to the ER. This is also approximately the third or fourth time this has happened over the past 1.5 months.  She had recently contacted her neurologist outpatient who stated that she needed another neurology referral given her new symptoms not related to migraines.  She states that her last episode was approximately last weekend with similar symptoms but today was worse, prompting her husband to call EMS. She denies any recent cold-like symptoms nor GI illnesses, nor sick contacts. She is alert and oriented x4 during exam however her affect is flat at times.  She appears to hesitate recalling some details and husband was also unable to clarify  during these times.  When asked to elaborate on some of her previous symptoms over the past month, she was also unable to do so, such as stating whether or not she had an associated headache with the symptoms.  She did however deny any visual changes during these episodes.  Medication bottles reviewed bedside, she is on lorazepam outpatient and also verified on the database.  She takes this consistently and daily without missing doses nor going prolonged periods without medication.  She also denied any significant alcohol use and states that she drinks "rarely".  Due to her dysarthria and weakness, she was evaluated for stroke while in the ER with neurology evaluation.  MRI brain as well as MRA head were negative for acute stroke nor vascular abnormalities. She was recommended for admission and further neuro work-up with EEG.  She was also recommended for a psych consult if work-up was negative given ambiguity of her presentation as well.   I have personally briefly reviewed patient's old medical records in [place]  Assessment/Plan: Altered mental status -Patient has described episodes over the past 1 to 2 months of possible seizure-like activity consisting of shaking and altered mentation, such as with today's event; however, she also has history of severe migraines (on nortriptyline, topamax, and PRN Roselyn Meier). Concerning is her inability to recall certain aspects of her symptoms that would be expected as well as an ambiguous neuro exam that was not consistent (varying levels of right sided weakness when exam repeated, and complete right side paresthesia). Differential includes  seizure vs stroke (ruled out with negative MRI/A) vs complex migraines vs conversion or other psychiatric disorder -Evaluated by neurology in the ER, appreciate assistance -Obtain EEG -Continue neurochecks every 4 hours -Continue on telemetry - check B1 and B12 levels  Chronic migraine without aura, with  intractable migraine, so stated, with status migrainosus -Await med rec, then resume home meds  Tobacco use -Currently smokes 1 PPD x30 years -Cessation counseling given bedside -Nicotine patch ordered  Depression -Await med rec, and resume home meds -Patient states she takes lorazepam consistently without missing doses - continue ativan 0.5 mg BIDPRN   Code Status: Full DVT Prophylaxis:enoxaparin (Lovenox) 40mg  SQ 2 hours prior to surgery then every day Anticipated disposition is to home  History: Past Medical History:  Diagnosis Date  . Anxiety   . Arrhythmia   . Depression   . Deviated septum   . Hx of migraines   . Hx of suicide attempt 2010   overdose on Trileptal, has been on Seroquel in the past  . Hyperlipidemia   . Tobacco use     Past Surgical History:  Procedure Laterality Date  . ABDOMINAL HYSTERECTOMY    . AUGMENTATION MAMMAPLASTY Bilateral    implants 30 years ago-saline  . BREAST BIOPSY Right 08/19/2019   Affirm bx-"coil" clip-path pending  . BREAST ENHANCEMENT SURGERY    . CESAREAN SECTION     x 2  . SHOULDER SURGERY Left    impingement     reports that she quit smoking about 8 months ago. Her smoking use included cigarettes. She has a 35.00 pack-year smoking history. She has never used smokeless tobacco. She reports current alcohol use. She reports current drug use. Drug: Marijuana.  No Known Allergies  Family History  Problem Relation Age of Onset  . Glaucoma Mother   . Heart disease Mother   . Heart Problems Mother   . Arrhythmia Mother        fib  . Deep vein thrombosis Father   . Heart Problems Father   . Heart disease Father   . Migraines Sister   . Stroke Maternal Grandmother   . Aneurysm Maternal Grandmother   . Heart disease Maternal Grandmother   . Diabetes Paternal Uncle        adult onset  . CAD Maternal Grandfather    Home Medications: Prior to Admission medications   Medication Sig Start Date End Date Taking?  Authorizing Provider  albuterol (VENTOLIN HFA) 108 (90 Base) MCG/ACT inhaler Inhale 2 puffs into the lungs every 6 (six) hours as needed for wheezing or shortness of breath. 01/22/19   03/24/19, DO  benzonatate (TESSALON) 200 MG capsule Take 1 capsule (200 mg total) by mouth 3 (three) times daily as needed for cough. 04/22/19   06/20/19, NP  Erenumab-aooe (AIMOVIG) 140 MG/ML SOAJ Inject 140 mg into the skin every 30 (thirty) days. 05/18/19   07/16/19, MD  fexofenadine [facility] ALLERGY) 60 MG tablet Take 1 tablet (60 mg total) by mouth 2 (two) times daily. 06/09/19   06/11/19, DO  fluticasone (FLONASE) 50 MCG/ACT nasal spray SPRAY 2 SPRAYS INTO EACH NOSTRIL EVERY DAY 04/20/19   06/18/19 P, DO  Fluticasone-Salmeterol (ADVAIR DISKUS) 500-50 MCG/DOSE AEPB Inhale 1 puff into the lungs 2 (two) times daily. 05/12/19   05/14/19, DO  gabapentin (NEURONTIN) 100 MG capsule Take 100 mg by mouth 2 (two) times daily. 12/26/18   [provider]  levofloxacin [facility])  750 MG tablet Take 1 tablet (750 mg total) by mouth daily. 05/18/19   Karie Fetch P, DO  montelukast (SINGULAIR) 10 MG tablet Take 1 tablet (10 mg total) by mouth at bedtime. 05/12/19   Steffanie Dunn, DO  nortriptyline (PAMELOR) 25 MG capsule TAKE 1 CAPSULE (25 MG TOTAL) BY MOUTH AT BEDTIME. 03/31/19   Anson Fret, MD  oxybutynin (DITROPAN) 5 MG tablet TAKE 1 TABLET BY MOUTH EVERY 6 TO 8 HOURS AS NEEDED FOR URINARY URGENCY/FREQUENCY 12/24/18   [provider]  sertraline (ZOLOFT) 100 MG tablet TAKE 1.5 TABLETS BY MOUTH AT BEDTIME FOR ANXIETY 12/25/18   [provider]  Tiotropium Bromide Monohydrate (SPIRIVA RESPIMAT) 1.25 MCG/ACT AERS Inhale 1 puff into the lungs daily. 06/09/19   Karie Fetch P, DO  topiramate (TOPAMAX) 100 MG tablet TAKE 1 TABLET BY MOUTH TWICE A DAY 07/23/19   Anson Fret, MD  Ubrogepant (UBRELVY) 50 MG TABS Take 100 mg by mouth every 2 (two) hours as needed. Max 200mg  a  day 10/02/18   10/04/18, MD    Review of Systems:  Pertinent items noted in HPI and remainder of comprehensive ROS otherwise negative.  Physical Exam: Vitals:   09/14/19 1945 09/14/19 2000 09/14/19 2015 09/14/19 2045  BP: 103/71 91/78 104/73 99/68  Pulse: (!) 56 61 60 (!) 58  Resp: 19 11 15 13   SpO2: 100% 99% 100% 100%  Weight:       General appearance: well developed adult woman with slowed but clear speech and slowed mentation laying in bed in no distress with husband bedside Head: Normocephalic, without obvious abnormality Eyes: squinted when trying to open her eyes and took multiple verbal and manual attempts to get her to cooperate and then follow finger, but otherwise EOMI,PERRLA Lungs: clear to auscultation bilaterally Heart: regular rate and rhythm and S1, S2 normal Abdomen: normal findings: bowel sounds normal and soft, non-tender Extremities: no edema Skin: scattered tiny erythematous lesions on arms and legs, nonblanching, nonraised, non tender nor puritic  Neurologic: varying neuro exam with repeated attempts: right side strength was ranging from 3 to 4+/5 and she endorsed paresthesias in the entire right hemibody (facial V1-V3) down to RUE including radial and ulnar distributions, and down to RLE all with paresthesias compared to left side. Dysmetria seemed ambiguous as her hand would steady out at times and other times be more "uncontrolled" but the right appeared worse than left with multiple attempts. With arm raise her right arm consistently would sag with patient stating "it feels heavy"  Labs on Admission:  I have personally reviewed following labs and imaging studies Results for orders placed or performed during the hospital encounter of 09/14/19 (from the past 24 hour(s))  CBG monitoring, ED     Status: None   Collection Time: 09/14/19  4:59 PM  Result Value Ref Range   Glucose-Capillary 91 70 - 99 mg/dL  Ethanol     Status: None   Collection Time:  09/14/19  5:00 PM  Result Value Ref Range   Alcohol, Ethyl (B) <10 <10 mg/dL  Protime-INR     Status: None   Collection Time: 09/14/19  5:00 PM  Result Value Ref Range   Prothrombin Time 12.6 11.4 - 15.2 seconds   INR 1.0 0.8 - 1.2  APTT     Status: None   Collection Time: 09/14/19  5:00 PM  Result Value Ref Range   aPTT 29 24 - 36 seconds  CBC  Status: Abnormal   Collection Time: 09/14/19  5:00 PM  Result Value Ref Range   WBC 11.4 (H) 4.0 - 10.5 K/uL   RBC 4.08 3.87 - 5.11 MIL/uL   Hemoglobin 12.6 12.0 - 15.0 g/dL   HCT 65.0 35.4 - 65.6 %   MCV 94.6 80.0 - 100.0 fL   MCH 30.9 26.0 - 34.0 pg   MCHC 32.6 30.0 - 36.0 g/dL   RDW 81.2 75.1 - 70.0 %   Platelets 236 150 - 400 K/uL   nRBC 0.0 0.0 - 0.2 %  Differential     Status: None   Collection Time: 09/14/19  5:00 PM  Result Value Ref Range   Neutrophils Relative % 63 %   Neutro Abs 7.3 1.7 - 7.7 K/uL   Lymphocytes Relative 27 %   Lymphs Abs 3.1 0.7 - 4.0 K/uL   Monocytes Relative 7 %   Monocytes Absolute 0.8 0.1 - 1.0 K/uL   Eosinophils Relative 2 %   Eosinophils Absolute 0.2 0.0 - 0.5 K/uL   Basophils Relative 1 %   Basophils Absolute 0.1 0.0 - 0.1 K/uL   Immature Granulocytes 0 %   Abs Immature Granulocytes 0.03 0.00 - 0.07 K/uL  Comprehensive metabolic panel     Status: Abnormal   Collection Time: 09/14/19  5:00 PM  Result Value Ref Range   Sodium 142 135 - 145 mmol/L   Potassium 3.5 3.5 - 5.1 mmol/L   Chloride 111 98 - 111 mmol/L   CO2 23 22 - 32 mmol/L   Glucose, Bld 89 70 - 99 mg/dL   BUN 13 6 - 20 mg/dL   Creatinine, Ser 1.74 (H) 0.44 - 1.00 mg/dL   Calcium 9.3 8.9 - 94.4 mg/dL   Total Protein 6.7 6.5 - 8.1 g/dL   Albumin 3.9 3.5 - 5.0 g/dL   AST 20 15 - 41 U/L   ALT 17 0 - 44 U/L   Alkaline Phosphatase 51 38 - 126 U/L   Total Bilirubin 0.6 0.3 - 1.2 mg/dL   GFR calc non Af Amer >60 >60 mL/min   GFR calc Af Amer >60 >60 mL/min   Anion gap 8 5 - 15  I-Stat beta hCG blood, ED     Status: None    Collection Time: 09/14/19  5:06 PM  Result Value Ref Range   I-stat hCG, quantitative <5.0 <5 mIU/mL   Comment 3          I-stat chem 8, ED     Status: Abnormal   Collection Time: 09/14/19  5:10 PM  Result Value Ref Range   Sodium 141 135 - 145 mmol/L   Potassium 3.4 (L) 3.5 - 5.1 mmol/L   Chloride 109 98 - 111 mmol/L   BUN 14 6 - 20 mg/dL   Creatinine, Ser 9.67 0.44 - 1.00 mg/dL   Glucose, Bld 83 70 - 99 mg/dL   Calcium, Ion 5.91 6.38 - 1.40 mmol/L   TCO2 22 22 - 32 mmol/L   Hemoglobin 12.6 12.0 - 15.0 g/dL   HCT 46.6 59.9 - 35.7 %     Radiological Exams on Admission: MR ANGIO HEAD WO CONTRAST  Result Date: 09/14/2019 CLINICAL DATA:  Acute neuro deficit.  Seizure. EXAM: MRI HEAD WITHOUT CONTRAST MRA HEAD WITHOUT CONTRAST TECHNIQUE: Multiplanar, multiecho pulse sequences of the brain and surrounding structures were obtained without intravenous contrast. Angiographic images of the head were obtained using MRA technique without contrast. COMPARISON:  CT head 09/14/2019 FINDINGS: MRI  HEAD FINDINGS Brain: No acute infarction, hemorrhage, hydrocephalus, extra-axial collection or mass lesion. No significant chronic ischemic change. Vascular: Normal arterial flow voids Skull and upper cervical spine: No focal skeletal lesion. Sinuses/Orbits: Mild mucosal edema paranasal sinuses. Normal orbit bilaterally Other: None MRA HEAD FINDINGS Internal carotid artery widely patent bilaterally. Anterior and middle cerebral arteries are normal and widely patent bilaterally Both vertebral arteries are patent without stenosis. PICA is patent bilaterally. AICA, superior cerebellar, posterior cerebral arteries widely patent without stenosis. Basilar widely patent. Negative for cerebral aneurysm. IMPRESSION: Negative MRI head Negative MRA head Electronically Signed   By: Marlan Palauharles  Clark M.D.   On: 09/14/2019 18:14   MR BRAIN WO CONTRAST  Result Date: 09/14/2019 CLINICAL DATA:  Acute neuro deficit.  Seizure. EXAM:  MRI HEAD WITHOUT CONTRAST MRA HEAD WITHOUT CONTRAST TECHNIQUE: Multiplanar, multiecho pulse sequences of the brain and surrounding structures were obtained without intravenous contrast. Angiographic images of the head were obtained using MRA technique without contrast. COMPARISON:  CT head 09/14/2019 FINDINGS: MRI HEAD FINDINGS Brain: No acute infarction, hemorrhage, hydrocephalus, extra-axial collection or mass lesion. No significant chronic ischemic change. Vascular: Normal arterial flow voids Skull and upper cervical spine: No focal skeletal lesion. Sinuses/Orbits: Mild mucosal edema paranasal sinuses. Normal orbit bilaterally Other: None MRA HEAD FINDINGS Internal carotid artery widely patent bilaterally. Anterior and middle cerebral arteries are normal and widely patent bilaterally Both vertebral arteries are patent without stenosis. PICA is patent bilaterally. AICA, superior cerebellar, posterior cerebral arteries widely patent without stenosis. Basilar widely patent. Negative for cerebral aneurysm. IMPRESSION: Negative MRI head Negative MRA head Electronically Signed   By: Marlan Palauharles  Clark M.D.   On: 09/14/2019 18:14   CT HEAD CODE STROKE WO CONTRAST  Result Date: 09/14/2019 CLINICAL DATA:  Code stroke. Slurred speech facial droop. Neuro deficit. EXAM: CT HEAD WITHOUT CONTRAST TECHNIQUE: Contiguous axial images were obtained from the base of the skull through the vertex without intravenous contrast. COMPARISON:  None. FINDINGS: Brain: No evidence of acute infarction, hemorrhage, hydrocephalus, extra-axial collection or mass lesion/mass effect. Vascular: Negative for hyperdense vessel Skull: Negative Sinuses/Orbits: Paranasal sinuses clear. Mastoid clear bilaterally. Negative orbit bilaterally. Other: None ASPECTS (Alberta Stroke Program Early CT Score) - Ganglionic level infarction (caudate, lentiform nuclei, internal capsule, insula, M1-M3 cortex): 7 - Supraganglionic infarction (M4-M6 cortex): 3 Total  score (0-10 with 10 being normal): 10 IMPRESSION: 1. Negative CT head 2. ASPECTS is 10 3. Results texted to Dr. Otelia LimesLindzen Electronically Signed   By: Marlan Palauharles  Clark M.D.   On: 09/14/2019 17:13   MR BRAIN WO CONTRAST  Final Result    MR ANGIO HEAD WO CONTRAST  Final Result    CT HEAD CODE STROKE WO CONTRAST  Final Result      Consults called:  Neuro  EKG: Independently reviewed. NSR   Lewie Chamberavid Girguis, MD [facility] Pager: Secure chat  If 7PM-7AM, please contact night-coverage [email or web address] Use universal [place] password for that web site. If you do not have the password, please call the hospital operator.  09/14/2019, 9:14 PM

## 2019-09-14 NOTE — Code Documentation (Signed)
Patient arrived from home.  Per EMS family witnessed seizure activity and they witness 2 episodes of seizure activity, head and eye deviation to the left, posturing of Upper Extremities lasting about 60-90 min.  Treated with 2mg  versed.  Code Stroke called at 1638  LKW 1600.  Patient arrived at 1655  Stat CT and labs done.  NIHSS 5, drowsy, right arm and leg drift, right decreased sensory, slurred speech.  Stat MRI done.

## 2019-09-14 NOTE — Assessment & Plan Note (Signed)
-  Await med rec, and resume home meds -Patient states she takes lorazepam consistently without missing doses - continue ativan 0.5 mg BIDPRN

## 2019-09-14 NOTE — ED Triage Notes (Signed)
EMS called to Pt home for seizure . EMS called a code stroke . During transport EMS witnessed 2 seizures lasting 60-90 mins. EMS gave 2 versed after second seizure. EMS reported posturing and rotation of Bil. Arms  With Lt head gaze .

## 2019-09-14 NOTE — Assessment & Plan Note (Signed)
-  Currently smokes 1 PPD x30 years -Cessation counseling given bedside -Nicotine patch ordered

## 2019-09-15 ENCOUNTER — Inpatient Hospital Stay (HOSPITAL_COMMUNITY): Payer: 59

## 2019-09-15 DIAGNOSIS — F329 Major depressive disorder, single episode, unspecified: Secondary | ICD-10-CM

## 2019-09-15 DIAGNOSIS — R4182 Altered mental status, unspecified: Secondary | ICD-10-CM

## 2019-09-15 LAB — BASIC METABOLIC PANEL
Anion gap: 9 (ref 5–15)
BUN: 11 mg/dL (ref 6–20)
CO2: 21 mmol/L — ABNORMAL LOW (ref 22–32)
Calcium: 9.3 mg/dL (ref 8.9–10.3)
Chloride: 111 mmol/L (ref 98–111)
Creatinine, Ser: 0.86 mg/dL (ref 0.44–1.00)
GFR calc Af Amer: >60 mL/min (ref >60)
GFR calc non Af Amer: >60 mL/min (ref >60)
Glucose, Bld: 98 mg/dL (ref 70–99)
Potassium: 3.4 mmol/L — ABNORMAL LOW (ref 3.5–5.1)
Sodium: 141 mmol/L (ref 135–145)

## 2019-09-15 LAB — CBC
HCT: 41.6 % (ref 36.0–46.0)
Hemoglobin: 13.7 g/dL (ref 12.0–15.0)
MCH: 31.3 pg (ref 26.0–34.0)
MCHC: 32.9 g/dL (ref 30.0–36.0)
MCV: 95 fL (ref 80.0–100.0)
Platelets: 210 10*3/uL (ref 150–400)
RBC: 4.38 MIL/uL (ref 3.87–5.11)
RDW: 12.5 % (ref 11.5–15.5)
WBC: 8.1 10*3/uL (ref 4.0–10.5)
nRBC: 0 % (ref 0.0–0.2)

## 2019-09-15 LAB — VITAMIN B12: Vitamin B-12: 291 pg/mL (ref 180–914)

## 2019-09-15 LAB — MAGNESIUM: Magnesium: 2.2 mg/dL (ref 1.7–2.4)

## 2019-09-15 MED ORDER — MOMETASONE FURO-FORMOTEROL FUM 200-5 MCG/ACT IN AERO
2.0000 | INHALATION_SPRAY | Freq: Two times a day (BID) | RESPIRATORY_TRACT | Status: DC
  Administered 2019-09-15 – 2019-09-16 (×3): 2 via RESPIRATORY_TRACT
  Filled 2019-09-15: qty 8.8

## 2019-09-15 MED ORDER — POTASSIUM CHLORIDE CRYS ER 20 MEQ PO TBCR
40.0000 meq | EXTENDED_RELEASE_TABLET | Freq: Once | ORAL | Status: AC
  Administered 2019-09-15: 40 meq via ORAL
  Filled 2019-09-15: qty 2

## 2019-09-15 MED ORDER — NORTRIPTYLINE HCL 25 MG PO CAPS
25.0000 mg | ORAL_CAPSULE | Freq: Every day | ORAL | Status: DC
  Administered 2019-09-15 (×2): 25 mg via ORAL
  Filled 2019-09-15 (×3): qty 1

## 2019-09-15 MED ORDER — SERTRALINE HCL 50 MG PO TABS
150.0000 mg | ORAL_TABLET | Freq: Every day | ORAL | Status: DC
  Administered 2019-09-15 (×2): 150 mg via ORAL
  Filled 2019-09-15: qty 1

## 2019-09-15 MED ORDER — NICOTINE 14 MG/24HR TD PT24
14.0000 mg | MEDICATED_PATCH | Freq: Every day | TRANSDERMAL | Status: DC
  Administered 2019-09-15: 14 mg via TRANSDERMAL
  Filled 2019-09-15: qty 1

## 2019-09-15 MED ORDER — OXYBUTYNIN CHLORIDE ER 5 MG PO TB24
5.0000 mg | ORAL_TABLET | Freq: Every day | ORAL | Status: DC
  Administered 2019-09-15: 5 mg via ORAL
  Filled 2019-09-15: qty 1

## 2019-09-15 MED ORDER — GUANFACINE HCL ER 1 MG PO TB24
1.0000 mg | ORAL_TABLET | Freq: Every day | ORAL | Status: DC
  Administered 2019-09-15 – 2019-09-16 (×2): 1 mg via ORAL
  Filled 2019-09-15 (×2): qty 1

## 2019-09-15 MED ORDER — TOPIRAMATE 100 MG PO TABS
100.0000 mg | ORAL_TABLET | Freq: Two times a day (BID) | ORAL | Status: DC
  Administered 2019-09-15 – 2019-09-16 (×4): 100 mg via ORAL
  Filled 2019-09-15 (×3): qty 1

## 2019-09-15 MED ORDER — GABAPENTIN 100 MG PO CAPS
100.0000 mg | ORAL_CAPSULE | Freq: Two times a day (BID) | ORAL | Status: DC
  Administered 2019-09-15 – 2019-09-16 (×4): 100 mg via ORAL
  Filled 2019-09-15 (×3): qty 1

## 2019-09-15 NOTE — Progress Notes (Signed)
vLTM started Neurology notified.  [facility] monitoring.  Event button tested

## 2019-09-15 NOTE — Progress Notes (Signed)
Called into room by family at 75. Family stated that pt c/o feeling strange and twitching of facial muscle around check and jaw was noted. Observed by this RN upon walking into the room. Right side of mouth with upper curve to lip was noted and twitching of the right eye lid :as compared with left side of mouth without movement, or noted changes. Pt not responding to verbal or sternal rub.  Attempted to assess pupils, but unable to view related to pupils angled toward upper portion of eye lid. Unable to view pupils at all. Eyelid closed tight on bilat. Eyes. Unable to force them open. This lasted from 1849 until 1851. At that time, pt became more responsive. Pt did not have an incontinent episode. Sats on room air and VS WNL. Pt having sonorous respirations. RN able to assess pupils with sluggish reaction noted. Weakness of right arm noted. Pt c/o bilat. Pain in arms.  During the episode, EEG tech was notified related to pushing the red button in which the husband had pushed. The tech notified this RN, and at that time the patient was not responding. EEG notified the neurologist. Pt recovered and alert and oriented times 4 with slow response to questions and right arm weakness noted.

## 2019-09-15 NOTE — Progress Notes (Signed)
EEG Completed; Results Pending  

## 2019-09-15 NOTE — Progress Notes (Signed)
Called into room at 1426 by patient needing to go to BR. Upon entering room, pt found with labile mood. Not responding to voice or sternal rub. Pupils sluggish, and reactive to light. Tremors to starting with bilat. Upper extrem and progressing toward lower extrem. Pt having right upward curve to outer lip, with eyelid closing onto right eye, with facial twitching of the right side. This continued for 5 to 6 seconds. Dr. Jerral Ralph called to check on patient at that time. This Clinical research associate informed MD of event. MD on the way to assess. Pt became more alert, but remained twitching. Charge nurse notified and in to assess. By that time, pt was coming around, MD also in room and pt more aware and talking. Observations reported to MD. Continue to monitor.

## 2019-09-15 NOTE — Progress Notes (Signed)
MRI brain and MRA head negative.   EEG pending.   Electronically signed: Dr. Caryl Pina

## 2019-09-15 NOTE — Care Plan (Signed)
Event button reviewed. NO eeg change. This was a non epileptic event. Please review final report for details. Dr Otelia Limes notified.   Jakyri Brunkhorst Annabelle Harman

## 2019-09-15 NOTE — Progress Notes (Addendum)
PROGRESS NOTE        PATIENT DETAILS Name: Nancy Porter Age: 49 y.o. Sex: female Date of Birth: [birth year] Admit Date: 09/14/2019 Admitting Physician Lewie Chamber, MD CHY:IFOYDXAJOI, Stann Mainland, MD  Brief Narrative: Patient is a 49 y.o. female with history of migraine, anxiety/depression who presented with 1 month history of intermittent headaches, seizure-like spells, subtle right-sided weakness-subsequently admitted to the hospitalist service for further evaluation and treatment.  Significant events: 5/31>> admit for intermittent headaches, "seizure-like spells", right facial droop.  Significant studies: 6/1: EEG>> no seizures 5/31: MRI brain/MRA brain>> Negative for acute abnormalities, vessels widely patent. 5/31: CT head>> no acute abnormalities. 5/31: UDS>> positive for benzodiazepines and tetrahydrocannabinol   Antimicrobial therapy: None  Microbiology data: None  Procedures : None  Consults: Neurology   DVT Prophylaxis : Prophylactic Lovenox   Subjective: When seen earlier this morning had no major complaints except for some mild burning sensation in her forehead.  However this afternoon-Per nursing report-she had a episode of a right facial droop, and then "seizure-like spell" and a brief period (10-15 seconds) of loss of consciousness.  When I subsequently went to see her-she was awake-able to talk in one-word sentences-and was having jerky movement of her right upper extremity.  She was able to track my movements-and when distracted-her jerking movements of her right upper extremity seem to slow down a bit.  Assessment/Plan: Seizure-like spells: Given the description of seizures-multiple inconsistencies on exam (see neuro consult note)-suspicion that this could be psychogenic-MRI brain negative for any structural lesions.  Spot EEG without any seizure-like activity.  After discussion with neurology-long-term EEG planned for  today.  Right-sided weakness: MRI brain negative for structural lesions-very subtle/mild right-sided weakness on my exam this morning-that seem to somewhat improved when distracted.  Per neuro consult note-similar inconsistencies noted on admission yesterday.  Supportive care-await LTM EEG.  History of migraine: Describes some burning sensation on the right side of her face but no obvious headaches-continue Topamax and Neurontin  Depression/anxiety: Continue Zoloft-and as needed lorazepam.  Per patient's daughter-some stressors-but patient has always been anxious.  ADHD: Remains on Intuniv  Diet: Diet Order            Diet regular Room service appropriate? Yes; Fluid consistency: Thin  Diet effective now               Code Status: Full code   Family Communication: Daughter at bedside  Addendum: late entry Daughter did ask me about minimizing some of her medications-including psych meds.Explained that in acute situation-it may not be possible-as some of these medications will need to be weaned off.May be worth pursuing this in the outpatient setting.   Disposition Plan: Status is: Inpatient  Remains inpatient appropriate because:Ongoing diagnostic testing needed not appropriate for outpatient work up and Inpatient level of care appropriate due to severity of illness   Dispo: The patient is from: Home              Anticipated d/c is to: Home              Anticipated d/c date is: 1 day              Patient currently is not medically stable to d/c.   Barriers to Discharge: Seizures versus pseudoseizures-LTM EEG planned for today.  Antimicrobial agents: Anti-infectives (From admission, onward)  None       Time spent: 25-minutes-Greater than 50% of this time was spent in counseling, explanation of diagnosis, planning of further management, and coordination of care.  MEDICATIONS: Scheduled Meds: . enoxaparin (LOVENOX) injection  40 mg Subcutaneous Q24H  . gabapentin   100 mg Oral BID  . guanFACINE  1 mg Oral Daily  . mometasone-formoterol  2 puff Inhalation BID  . nicotine  14 mg Transdermal Daily  . nortriptyline  25 mg Oral QHS  . oxybutynin  5 mg Oral QHS  . sertraline  150 mg Oral QHS  . topiramate  100 mg Oral BID   Continuous Infusions: PRN Meds:.acetaminophen, LORazepam, ondansetron   PHYSICAL EXAM: Vital signs: Vitals:   09/15/19 0356 09/15/19 0600 09/15/19 0800 09/15/19 1200  BP: 100/73 106/78 112/80 107/72  Pulse: (!) 47 60 (!) 58 (!) 55  Resp: 18 18 20 16   Temp: (!) 97.4 F (36.3 C) 97.6 F (36.4 C) 97.8 F (36.6 C) 97.8 F (36.6 C)  TempSrc: Oral Oral Oral Oral  SpO2: 100% 99% 100% 100%  Weight:       Filed Weights   09/14/19 1733  Weight: 63.2 kg   Body mass index is 21.82 kg/m.   Gen Exam:Alert awake-not in any distress HEENT:atraumatic, normocephalic Chest: B/L clear to auscultation anteriorly CVS:S1S2 regular Abdomen:soft non tender, non distended Extremities:no edema Neurology: Non focal Skin: no rash  I have personally reviewed following labs and imaging studies  LABORATORY DATA: CBC: Recent Labs  Lab 09/14/19 1700 09/14/19 1710 09/15/19 0506  WBC 11.4*  --  8.1  NEUTROABS 7.3  --   --   HGB 12.6 12.6 13.7  HCT 38.6 37.0 41.6  MCV 94.6  --  95.0  PLT 236  --  210    Basic Metabolic Panel: Recent Labs  Lab 09/14/19 1700 09/14/19 1710 09/15/19 0506  NA 142 141 141  K 3.5 3.4* 3.4*  CL 111 109 111  CO2 23  --  21*  GLUCOSE 89 83 98  BUN 13 14 11   CREATININE 1.03* 1.00 0.86  CALCIUM 9.3  --  9.3  MG  --   --  2.2    GFR: Estimated Creatinine Clearance: 75.3 mL/min (by C-G formula based on SCr of 0.86 mg/dL).  Liver Function Tests: Recent Labs  Lab 09/14/19 1700  AST 20  ALT 17  ALKPHOS 51  BILITOT 0.6  PROT 6.7  ALBUMIN 3.9   No results for input(s): LIPASE, AMYLASE in the last 168 hours. No results for input(s): AMMONIA in the last 168 hours.  Coagulation  Profile: Recent Labs  Lab 09/14/19 1700  INR 1.0    Cardiac Enzymes: No results for input(s): CKTOTAL, CKMB, CKMBINDEX, TROPONINI in the last 168 hours.  BNP (last 3 results) No results for input(s): PROBNP in the last 8760 hours.  Lipid Profile: No results for input(s): CHOL, HDL, LDLCALC, TRIG, CHOLHDL, LDLDIRECT in the last 72 hours.  Thyroid Function Tests: No results for input(s): TSH, T4TOTAL, FREET4, T3FREE, THYROIDAB in the last 72 hours.  Anemia Panel: Recent Labs    09/14/19 2226  VITAMINB12 291    Urine analysis:    Component Value Date/Time   COLORURINE YELLOW 09/14/2019 2120   APPEARANCEUR CLEAR 09/14/2019 2120   LABSPEC 1.011 09/14/2019 2120   PHURINE 6.0 09/14/2019 2120   GLUCOSEU NEGATIVE 09/14/2019 2120   HGBUR NEGATIVE 09/14/2019 2120   BILIRUBINUR NEGATIVE 09/14/2019 2120   KETONESUR NEGATIVE 09/14/2019 2120  PROTEINUR NEGATIVE 09/14/2019 2120   NITRITE NEGATIVE 09/14/2019 2120   LEUKOCYTESUR NEGATIVE 09/14/2019 2120    Sepsis Labs: Lactic Acid, Venous No results found for: LATICACIDVEN  MICROBIOLOGY: Recent Results (from the past 240 hour(s))  SARS Coronavirus 2 by RT PCR (hospital order, performed in [facility] hospital lab) Nasopharyngeal Nasopharyngeal Swab     Status: None   Collection Time: 09/14/19  8:00 PM   Specimen: Nasopharyngeal Swab  Result Value Ref Range Status   SARS Coronavirus 2 NEGATIVE NEGATIVE Final    Comment: (NOTE) SARS-CoV-2 target nucleic acids are NOT DETECTED. The SARS-CoV-2 RNA is generally detectable in upper and lower respiratory specimens during the acute phase of infection. The lowest concentration of SARS-CoV-2 viral copies this assay can detect is 250 copies / mL. A negative result does not preclude SARS-CoV-2 infection and should not be used as the sole basis for treatment or other patient management decisions.  A negative result may occur with improper specimen collection / handling, submission of  specimen other than nasopharyngeal swab, presence of viral mutation(s) within the areas targeted by this assay, and inadequate number of viral copies (<250 copies / mL). A negative result must be combined with clinical observations, patient history, and epidemiological information. Fact Sheet for Patients:   BoilerBrush.com.cy Fact Sheet for Healthcare Providers: https://pope.com/ This test is not yet approved or cleared  by the [place] FDA and has been authorized for detection and/or diagnosis of SARS-CoV-2 by FDA under an Emergency Use Authorization (EUA).  This EUA will remain in effect (meaning this test can be used) for the duration of the COVID-19 declaration under Section 564(b)(1) of the Act, 21 U.S.C. section 360bbb-3(b)(1), unless the authorization is terminated or revoked sooner. Performed at [facility], [address], [place], [place] [address]     RADIOLOGY STUDIES/RESULTS: EEG  Result Date: 09/15/2019 Charlsie Quest, MD     09/15/2019  1:15 PM Patient Name: NABILAH DAVOLI MRN: [id number] Epilepsy Attending: Charlsie Quest Referring Physician/Provider: Dr. Caryl Pina Date: 09/15/2019 Duration: 23.41 minutes Patient history: 49 year old female presenting with seizure-like spells.  EEG evaluate for seizures. Level of alertness: Awake, asleep AEDs during EEG study: gabapentin, topiramate Technical aspects: This EEG study was done with scalp electrodes positioned according to the 10-20 International system of electrode placement. Electrical activity was acquired at a sampling rate of 500Hz  and reviewed with a high frequency filter of 70Hz  and a low frequency filter of 1Hz . EEG data were recorded continuously and digitally stored. Description: The posterior dominant rhythm consists of 10 Hz activity of moderate voltage (25-35 uV) seen predominantly in posterior head regions, symmetric and reactive to eye opening and eye  closing. Sleep was characterized by vertex waves, sleep spindles (12 to 14 Hz), maximal frontocentral region.   Physiology photic driving was  seen during photic stimulation.  Hyperventilation was not performed.   IMPRESSION: This study is within normal limits.  No seizures or epileptiform discharges were seen throughout the recording.   MR ANGIO HEAD WO CONTRAST  Result Date: 09/14/2019 CLINICAL DATA:  Acute neuro deficit.  Seizure. EXAM: MRI HEAD WITHOUT CONTRAST MRA HEAD WITHOUT CONTRAST TECHNIQUE: Multiplanar, multiecho pulse sequences of the brain and surrounding structures were obtained without intravenous contrast. Angiographic images of the head were obtained using MRA technique without contrast. COMPARISON:  CT head 09/14/2019 FINDINGS: MRI HEAD FINDINGS Brain: No acute infarction, hemorrhage, hydrocephalus, extra-axial collection or mass lesion. No significant chronic ischemic change. Vascular:  Normal arterial flow voids Skull and upper cervical spine: No focal skeletal lesion. Sinuses/Orbits: Mild mucosal edema paranasal sinuses. Normal orbit bilaterally Other: None MRA HEAD FINDINGS Internal carotid artery widely patent bilaterally. Anterior and middle cerebral arteries are normal and widely patent bilaterally Both vertebral arteries are patent without stenosis. PICA is patent bilaterally. AICA, superior cerebellar, posterior cerebral arteries widely patent without stenosis. Basilar widely patent. Negative for cerebral aneurysm. IMPRESSION: Negative MRI head Negative MRA head Electronically Signed   By: Franchot Gallo M.D.   On: 09/14/2019 18:14   MR BRAIN WO CONTRAST  Result Date: 09/14/2019 CLINICAL DATA:  Acute neuro deficit.  Seizure. EXAM: MRI HEAD WITHOUT CONTRAST MRA HEAD WITHOUT CONTRAST TECHNIQUE: Multiplanar, multiecho pulse sequences of the brain and surrounding structures were obtained without intravenous contrast. Angiographic images of the head were obtained using  MRA technique without contrast. COMPARISON:  CT head 09/14/2019 FINDINGS: MRI HEAD FINDINGS Brain: No acute infarction, hemorrhage, hydrocephalus, extra-axial collection or mass lesion. No significant chronic ischemic change. Vascular: Normal arterial flow voids Skull and upper cervical spine: No focal skeletal lesion. Sinuses/Orbits: Mild mucosal edema paranasal sinuses. Normal orbit bilaterally Other: None MRA HEAD FINDINGS Internal carotid artery widely patent bilaterally. Anterior and middle cerebral arteries are normal and widely patent bilaterally Both vertebral arteries are patent without stenosis. PICA is patent bilaterally. AICA, superior cerebellar, posterior cerebral arteries widely patent without stenosis. Basilar widely patent. Negative for cerebral aneurysm. IMPRESSION: Negative MRI head Negative MRA head Electronically Signed   By: Franchot Gallo M.D.   On: 09/14/2019 18:14   CT HEAD CODE STROKE WO CONTRAST  Result Date: 09/14/2019 CLINICAL DATA:  Code stroke. Slurred speech facial droop. Neuro deficit. EXAM: CT HEAD WITHOUT CONTRAST TECHNIQUE: Contiguous axial images were obtained from the base of the skull through the vertex without intravenous contrast. COMPARISON:  None. FINDINGS: Brain: No evidence of acute infarction, hemorrhage, hydrocephalus, extra-axial collection or mass lesion/mass effect. Vascular: Negative for hyperdense vessel Skull: Negative Sinuses/Orbits: Paranasal sinuses clear. Mastoid clear bilaterally. Negative orbit bilaterally. Other: None ASPECTS ([place] Stroke Program Early CT Score) - Ganglionic level infarction (caudate, lentiform nuclei, internal capsule, insula, M1-M3 cortex): 7 - Supraganglionic infarction (M4-M6 cortex): 3 Total score (0-10 with 10 being normal): 10 IMPRESSION: 1. Negative CT head 2. ASPECTS is 10 3. Results texted to Dr. Cheral Marker Electronically Signed   By: Franchot Gallo M.D.   On: 09/14/2019 17:13     LOS: 1 day   Oren Binet, MD  [facility]    To contact the attending provider between 7A-7P or the covering provider during after hours 7P-7A, please log into the web site [email or web address] and access using universal [place] password for that web site. If you do not have the password, please call the hospital operator.  09/15/2019, 2:42 PM

## 2019-09-15 NOTE — Procedures (Signed)
Patient Name: Nancy Porter  MRN: [id number]  Epilepsy Attending: Charlsie Quest  Referring Physician/Provider: Dr. Caryl Pina Date: 09/15/2019 Duration: 23.41 minutes  Patient history: 48 year old female presenting with seizure-like spells.  EEG evaluate for seizures.  Level of alertness: Awake, asleep  AEDs during EEG study: gabapentin, topiramate  Technical aspects: This EEG study was done with scalp electrodes positioned according to the 10-20 International system of electrode placement. Electrical activity was acquired at a sampling rate of 500Hz  and reviewed with a high frequency filter of 70Hz  and a low frequency filter of 1Hz . EEG data were recorded continuously and digitally stored.   Description: The posterior dominant rhythm consists of 10 Hz activity of moderate voltage (25-35 uV) seen predominantly in posterior head regions, symmetric and reactive to eye opening and eye closing. Sleep was characterized by vertex waves, sleep spindles (12 to 14 Hz), maximal frontocentral region.   Physiology photic driving was  seen during photic stimulation.  Hyperventilation was not performed.     IMPRESSION: This study is within normal limits.  No seizures or epileptiform discharges were seen throughout the recording.  Nuvia Hileman 

## 2019-09-15 NOTE — Progress Notes (Signed)
PT Cancellation Note  Patient Details Name: Nancy Porter MRN: [id number] DOB: Apr 01, 1971   Cancelled Treatment:    Reason Eval/Treat Not Completed: Patient at procedure or test/unavailable Pt currently getting EEG. Will follow up as schedule allows.   Cindee Salt, DPT  Acute Rehabilitation Services  Pager: [phone] Office: [phone]    Lehman Prom 09/15/2019, 10:33 AM

## 2019-09-15 NOTE — Evaluation (Signed)
Physical Therapy Evaluation Patient Details Name: Nancy Porter MRN: [id number] DOB: 1970-12-28 Today's Date: 09/15/2019   History of Present Illness  Pt is a 49 y/o female admitted secondary to seizure like activity. Possibly psychogenic in nature. MRI negative for acute abnormality. EEG WNL. PMH includes migraine, tobacco use, depression/anxiety.   Clinical Impression  Pt admitted secondary to problem above with deficits below. Pt requiring min guard A for mobility tasks within the room. Pt reporting "tiredness" of RLE during ambulation. Inconsistencies noted, as no jerking type movements noted with bed mobility, however, when marching or taking steps, noted some jerking type movements of RLE. Educated about using a cane for comfort when ambulating given "tiredness" of RLE. Anticipate pt will progress well and will not require follow up PT at d/c. Will continue to follow acutely to maximize functional mobility independence and safety.     Follow Up Recommendations No PT follow up;Supervision - Intermittent    Equipment Recommendations  None recommended by PT    Recommendations for Other Services       Precautions / Restrictions Precautions Precautions: Fall Restrictions Weight Bearing Restrictions: No      Mobility  Bed Mobility Overal bed mobility: Independent                Transfers Overall transfer level: Needs assistance Equipment used: None Transfers: Sit to/from Stand Sit to Stand: Min guard         General transfer comment: Min guard for safety.   Ambulation/Gait Ambulation/Gait assistance: Min guard Gait Distance (Feet): 40 Feet Assistive device: None Gait Pattern/deviations: Step-through pattern;Decreased stride length;Narrow base of support Gait velocity: Decreased   General Gait Details: No LOB noted during gait. Pt reporting "tiredness" in RLE during gait. Very guarded. Min guard for safety. Educated about using cane for comfort given  "tiredness" of RLE during gait.   Stairs            Wheelchair Mobility    Modified Rankin (Stroke Patients Only)       Balance Overall balance assessment: Needs assistance Sitting-balance support: No upper extremity supported;Feet supported Sitting balance-Leahy Scale: Good     Standing balance support: No upper extremity supported;During functional activity Standing balance-Leahy Scale: Fair                               Pertinent Vitals/Pain Pain Assessment: 0-10 Pain Score: 8  Pain Location: headache Pain Descriptors / Indicators: Headache Pain Intervention(s): Limited activity within patient's tolerance;Monitored during session;Repositioned    Home Living Family/patient expects to be discharged to:: Private residence Living Arrangements: Spouse/significant other;Children Available Help at Discharge: Family;Available PRN/intermittently Type of Home: House Home Access: Stairs to enter Entrance Stairs-Rails: None Entrance Stairs-Number of Steps: 2 Home Layout: One level Home Equipment: Cane - single point      Prior Function Level of Independence: Independent               Hand Dominance        Extremity/Trunk Assessment   Upper Extremity Assessment Upper Extremity Assessment: Overall WFL for tasks assessed    Lower Extremity Assessment Lower Extremity Assessment: RLE deficits/detail RLE Deficits / Details: Reports RLE felt "tired" during mobility. No difficulty moving towards EOB, however, when marching and taking steps jerking type movement. Inconsitencies noted.        Communication   Communication: No difficulties  Cognition Arousal/Alertness: Awake/alert Behavior During Therapy: WFL for tasks assessed/performed Overall Cognitive  Status: Within Functional Limits for tasks assessed                                        General Comments General comments (skin integrity, edema, etc.): Pt's daughter present  during session. Educated about safe stair navigation at home    Exercises     Assessment/Plan    PT Assessment Patient needs continued PT services  PT Problem List Decreased mobility;Decreased activity tolerance;Decreased balance       PT Treatment Interventions Gait training;DME instruction;Stair training;Functional mobility training;Therapeutic exercise;Therapeutic activities;Balance training;Patient/family education    PT Goals (Current goals can be found in the Care Plan section)  Acute Rehab PT Goals Patient Stated Goal: none stated PT Goal Formulation: With patient Time For Goal Achievement: 09/29/19 Potential to Achieve Goals: Good    Frequency Min 3X/week   Barriers to discharge        Co-evaluation               AM-PAC PT "6 Clicks" Mobility  Outcome Measure Help needed turning from your back to your side while in a flat bed without using bedrails?: None Help needed moving from lying on your back to sitting on the side of a flat bed without using bedrails?: None Help needed moving to and from a bed to a chair (including a wheelchair)?: A Little Help needed standing up from a chair using your arms (e.g., wheelchair or bedside chair)?: A Little Help needed to walk in hospital room?: A Little Help needed climbing 3-5 steps with a railing? : A Little 6 Click Score: 20    End of Session Equipment Utilized During Treatment: Gait belt Activity Tolerance: Patient tolerated treatment well Patient left: in bed;with call bell/phone within reach;with family/visitor present Nurse Communication: Mobility status PT Visit Diagnosis: Other abnormalities of gait and mobility (R26.89)    Time: 5170-0174 PT Time Calculation (min) (ACUTE ONLY): 14 min   Charges:   PT Evaluation $PT Eval Low Complexity: 1 Low          Lou Miner, DPT  Acute Rehabilitation Services  Pager: [phone] Office: [phone]   Rudean Hitt 09/15/2019, 3:45  PM

## 2019-09-15 NOTE — Progress Notes (Signed)
Spot EEG was negative.   Patient had another seizure-like spell as preparations were being made for discharge. Family was present in the room. Per Dr. Jerral Ralph, the episode appeared most likely to be psychogenic.   Obtaining LTM EEG.   Electronically signed: Dr. Caryl Pina

## 2019-09-16 DIAGNOSIS — F445 Conversion disorder with seizures or convulsions: Principal | ICD-10-CM

## 2019-09-16 LAB — BASIC METABOLIC PANEL
Anion gap: 11 (ref 5–15)
BUN: 11 mg/dL (ref 6–20)
CO2: 21 mmol/L — ABNORMAL LOW (ref 22–32)
Calcium: 9.4 mg/dL (ref 8.9–10.3)
Chloride: 110 mmol/L (ref 98–111)
Creatinine, Ser: 0.87 mg/dL (ref 0.44–1.00)
GFR calc Af Amer: >60 mL/min (ref >60)
GFR calc non Af Amer: >60 mL/min (ref >60)
Glucose, Bld: 95 mg/dL (ref 70–99)
Potassium: 3.9 mmol/L (ref 3.5–5.1)
Sodium: 142 mmol/L (ref 135–145)

## 2019-09-16 MED ORDER — SERTRALINE HCL 100 MG PO TABS: 200.0000 mg | ORAL_TABLET | Freq: Every day | ORAL | Status: DC

## 2019-09-16 MED ORDER — CYANOCOBALAMIN 1000 MCG/ML IJ SOLN
1000.0000 ug | Freq: Every day | INTRAMUSCULAR | Status: DC
  Administered 2019-09-16: 1000 ug via SUBCUTANEOUS
  Filled 2019-09-16: qty 1

## 2019-09-16 MED ORDER — ALBUTEROL SULFATE (2.5 MG/3ML) 0.083% IN NEBU: 2.5000 mg | INHALATION_SOLUTION | Freq: Four times a day (QID) | RESPIRATORY_TRACT | Status: DC | PRN

## 2019-09-16 MED ORDER — KETOROLAC TROMETHAMINE 30 MG/ML IJ SOLN
30.0000 mg | Freq: Once | INTRAMUSCULAR | Status: AC
  Administered 2019-09-16: 30 mg via INTRAVENOUS
  Filled 2019-09-16: qty 1

## 2019-09-16 MED ORDER — CLONAZEPAM 0.5 MG PO TABS
0.5000 mg | ORAL_TABLET | Freq: Two times a day (BID) | ORAL | Status: DC
  Filled 2019-09-16: qty 1

## 2019-09-16 MED ORDER — SERTRALINE HCL 100 MG PO TABS: 200.0000 mg | ORAL_TABLET | Freq: Every day | ORAL | 0 refills | Status: DC

## 2019-09-16 MED ORDER — LORAZEPAM 0.5 MG PO TABS
0.5000 mg | ORAL_TABLET | Freq: Two times a day (BID) | ORAL | Status: DC | PRN
  Administered 2019-09-16: 0.5 mg via ORAL
  Filled 2019-09-16: qty 1

## 2019-09-16 NOTE — Progress Notes (Signed)
LTM EEG discontinued - no skin breakdown at [facility]. Pt did state her scalp felt sensitive

## 2019-09-16 NOTE — Discharge Instructions (Signed)
Seizure, Adult A seizure is a sudden burst of abnormal electrical activity in the brain. Seizures usually last from 30 seconds to 2 minutes. They can cause many different symptoms. Usually, seizures are not harmful unless they last a long time. What are the causes? Common causes of this condition include:  Fever or infection.  Conditions that affect the brain, such as: ? A brain abnormality that you were born with. ? A brain or head injury. ? Bleeding in the brain. ? A tumor. ? Stroke. ? Brain disorders such as autism or cerebral palsy.  Low blood sugar.  Conditions that are passed from parent to child (are inherited).  Problems with substances, such as: ? Having a reaction to a drug or a medicine. ? Suddenly stopping the use of a substance (withdrawal). In some cases, the cause may not be known. A person who has repeated seizures over time without a clear cause has a condition called epilepsy. What increases the risk? You are more likely to get this condition if you have:  A family history of epilepsy.  Had a seizure in the past.  A brain disorder.  A history of head injury, lack of oxygen at birth, or strokes. What are the signs or symptoms? There are many types of seizures. The symptoms vary depending on the type of seizure you have. Examples of symptoms during a seizure include:  Shaking (convulsions).  Stiffness in the body.  Passing out (losing consciousness).  Head nodding.  Staring.  Not responding to sound or touch.  Loss of bladder control and bowel control. Some people have symptoms right before and right after a seizure happens. Symptoms before a seizure may include:  Fear.  Worry (anxiety).  Feeling like you may vomit (nauseous).  Feeling like the room is spinning (vertigo).  Feeling like you saw or heard something before (dj vu).  Odd tastes or smells.  Changes in how you see. You may see flashing lights or spots. Symptoms after a  seizure happens can include:  Confusion.  Sleepiness.  Headache.  Weakness on one side of the body. How is this treated? Most seizures will stop on their own in under 5 minutes. In these cases, no treatment is needed. Seizures that last longer than 5 minutes will usually need treatment. Treatment can include:  Medicines given through an IV tube.  Avoiding things that are known to cause your seizures. These can include medicines that you take for another condition.  Medicines to treat epilepsy.  Surgery to stop the seizures. This may be needed if medicines do not help. Follow these instructions at home: Medicines  Take over-the-counter and prescription medicines only as told by your doctor.  Do not eat or drink anything that may keep your medicine from working, such as alcohol. Activity  Do not do any activities that would be dangerous if you had another seizure, like driving or swimming. Wait until your doctor says it is safe for you to do them.  If you live in the [place] ask your local DMV (department of motor vehicles) when you can drive.  Get plenty of rest. Teaching others Teach friends and family what to do when you have a seizure. They should:  Lay you on the ground.  Protect your head and body.  Loosen any tight clothing around your neck.  Turn you on your side.  Not hold you down.  Not put anything into your mouth.  Know whether or not you need emergency care.  Stay   with you until you are better.  General instructions  Contact your doctor each time you have a seizure.  Avoid anything that gives you seizures.  Keep a seizure diary. Write down: ? What you think caused each seizure. ? What you remember about each seizure.  Keep all follow-up visits as told by your doctor. This is important. Contact a doctor if:  You have another seizure.  You have seizures more often.  There is any change in what happens during your seizures.  You keep having  seizures with treatment.  You have symptoms of being sick or having an infection. Get help right away if:  You have a seizure that: ? Lasts longer than 5 minutes. ? Is different than seizures you had before. ? Makes it harder to breathe. ? Happens after you hurt your head.  You have any of these symptoms after a seizure: ? Not being able to speak. ? Not being able to use a part of your body. ? Confusion. ? A bad headache.  You have two or more seizures in a row.  You do not wake up right after a seizure.  You get hurt during a seizure. These symptoms may be an emergency. Do not wait to see if the symptoms will go away. Get medical help right away. Call your local emergency services (911 in the [place] Do not drive yourself to the hospital. Summary  Seizures usually last from 30 seconds to 2 minutes. Usually, they are not harmful unless they last a long time.  Do not eat or drink anything that may keep your medicine from working, such as alcohol.  Teach friends and family what to do when you have a seizure.  Contact your doctor each time you have a seizure. This information is not intended to replace advice given to you by your health care provider. Make sure you discuss any questions you have with your health care provider. Document Revised: 06/20/2018 Document Reviewed: 06/20/2018 Elsevier Patient Education  2020 [employer]   Managing Anxiety, Adult After being diagnosed with an anxiety disorder, you may be relieved to know why you have felt or behaved a certain way. You may also feel overwhelmed about the treatment ahead and what it will mean for your life. With care and support, you can manage this condition and recover from it. How to manage lifestyle changes Managing stress and anxiety  Stress is your body's reaction to life changes and events, both good and bad. Most stress will last just a few hours, but stress can be ongoing and can lead to more than just stress.  Although stress can play a major role in anxiety, it is not the same as anxiety. Stress is usually caused by something external, such as a deadline, test, or competition. Stress normally passes after the triggering event has ended.  Anxiety is caused by something internal, such as imagining a terrible outcome or worrying that something will go wrong that will devastate you. Anxiety often does not go away even after the triggering event is over, and it can become long-term (chronic) worry. It is important to understand the differences between stress and anxiety and to manage your stress effectively so that it does not lead to an anxious response. Talk with your health care provider or a counselor to learn more about reducing anxiety and stress. He or she may suggest tension reduction techniques, such as:  Music therapy. This can include creating or listening to music that you enjoy and  that inspires you.  Mindfulness-based meditation. This involves being aware of your normal breaths while not trying to control your breathing. It can be done while sitting or walking.  Centering prayer. This involves focusing on a word, phrase, or sacred image that means something to you and brings you peace.  Deep breathing. To do this, expand your stomach and inhale slowly through your nose. Hold your breath for 3-5 seconds. Then exhale slowly, letting your stomach muscles relax.  Self-talk. This involves identifying thought patterns that lead to anxiety reactions and changing those patterns.  Muscle relaxation. This involves tensing muscles and then relaxing them. Choose a tension reduction technique that suits your lifestyle and personality. These techniques take time and practice. Set aside 5-15 minutes a day to do them. Therapists can offer counseling and training in these techniques. The training to help with anxiety may be covered by some insurance plans. Other things you can do to manage stress and anxiety  include:  Keeping a stress/anxiety diary. This can help you learn what triggers your reaction and then learn ways to manage your response.  Thinking about how you react to certain situations. You may not be able to control everything, but you can control your response.  Making time for activities that help you relax and not feeling guilty about spending your time in this way.  Visual imagery and yoga can help you stay calm and relax.  Medicines Medicines can help ease symptoms. Medicines for anxiety include:  Anti-anxiety drugs.  Antidepressants. Medicines are often used as a primary treatment for anxiety disorder. Medicines will be prescribed by a health care provider. When used together, medicines, psychotherapy, and tension reduction techniques may be the most effective treatment. Relationships Relationships can play a big part in helping you recover. Try to spend more time connecting with trusted friends and family members. Consider going to couples counseling, taking family education classes, or going to family therapy. Therapy can help you and others better understand your condition. How to recognize changes in your anxiety Everyone responds differently to treatment for anxiety. Recovery from anxiety happens when symptoms decrease and stop interfering with your daily activities at home or work. This may mean that you will start to:  Have better concentration and focus. Worry will interfere less in your daily thinking.  Sleep better.  Be less irritable.  Have more energy.  Have improved memory. It is important to recognize when your condition is getting worse. Contact your health care provider if your symptoms interfere with home or work and you feel like your condition is not improving. Follow these instructions at home: Activity  Exercise. Most adults should do the following: ? Exercise for at least 150 minutes each week. The exercise should increase your heart rate and  make you sweat (moderate-intensity exercise). ? Strengthening exercises at least twice a week.  Get the right amount and quality of sleep. Most adults need 7-9 hours of sleep each night. Lifestyle   Eat a healthy diet that includes plenty of vegetables, fruits, whole grains, low-fat dairy products, and lean protein. Do not eat a lot of foods that are high in solid fats, added sugars, or salt.  Make choices that simplify your life.  Do not use any products that contain nicotine or tobacco, such as cigarettes, e-cigarettes, and chewing tobacco. If you need help quitting, ask your health care provider.  Avoid caffeine, alcohol, and certain over-the-counter cold medicines. These may make you feel worse. Ask your pharmacist which medicines  to avoid. General instructions  Take over-the-counter and prescription medicines only as told by your health care provider.  Keep all follow-up visits as told by your health care provider. This is important. Where to find support You can get help and support from these sources:  Self-help groups.  Online and [employer].  A trusted spiritual leader.  Couples counseling.  Family education classes.  Family therapy. Where to find more information You may find that joining a support group helps you deal with your anxiety. The following sources can help you locate counselors or support groups near you:  [place]: www.mentalhealth[place].net  Anxiety and Depression Association of [place] (ADAA): https://www.clark.net/  National Alliance on Mental Illness (NAMI): www.nami.org Contact a health care provider if you:  Have a hard time staying focused or finishing daily tasks.  Spend many hours a day feeling worried about everyday life.  Become exhausted by worry.  Start to have headaches, feel tense, or have nausea.  Urinate more than normal.  Have diarrhea. Get help right away if you have:  A racing heart and shortness of  breath.  Thoughts of hurting yourself or others. If you ever feel like you may hurt yourself or others, or have thoughts about taking your own life, get help right away. You can go to your nearest emergency department or call:  Your local emergency services (911 in the [place]  A suicide crisis helpline, such as the [place] at (404)684-4001. This is open 24 hours a day. Summary  Taking steps to learn and use tension reduction techniques can help calm you and help prevent triggering an anxiety reaction.  When used together, medicines, psychotherapy, and tension reduction techniques may be the most effective treatment.  Family, friends, and partners can play a big part in helping you recover from an anxiety disorder. This information is not intended to replace advice given to you by your health care provider. Make sure you discuss any questions you have with your health care provider. Document Revised: 09/02/2018 Document Reviewed: 09/02/2018 Elsevier Patient Education  [place].

## 2019-09-16 NOTE — Plan of Care (Signed)
Pt VS have been in the lower 90's than her normal baseline above 100's. Pt is on RA. Pt c/o a HA/migraine that has been constant. PRN meds given. One seizure like episode around 2121. Problem: Education: Goal: Knowledge of General Education information will improve Description: Including pain rating scale, medication(s)/side effects and non-pharmacologic comfort measures Outcome: Progressing   Problem: Health Behavior/Discharge Planning: Goal: Ability to manage health-related needs will improve Outcome: Progressing   Problem: Clinical Measurements: Goal: Ability to maintain clinical measurements within normal limits will improve Outcome: Progressing Goal: Will remain free from infection Outcome: Progressing Goal: Diagnostic test results will improve Outcome: Progressing Goal: Respiratory complications will improve Outcome: Progressing Goal: Cardiovascular complication will be avoided Outcome: Progressing   Problem: Activity: Goal: Risk for activity intolerance will decrease Outcome: Progressing   Problem: Nutrition: Goal: Adequate nutrition will be maintained Outcome: Progressing   Problem: Coping: Goal: Level of anxiety will decrease Outcome: Progressing   Problem: Elimination: Goal: Will not experience complications related to bowel motility Outcome: Progressing Goal: Will not experience complications related to urinary retention Outcome: Progressing   Problem: Pain Managment: Goal: General experience of comfort will improve Outcome: Progressing   Problem: Safety: Goal: Ability to remain free from injury will improve Outcome: Progressing   Problem: Skin Integrity: Goal: Risk for impaired skin integrity will decrease Outcome: Progressing   Problem: Education: Goal: Knowledge of disease or condition will improve Outcome: Progressing Goal: Knowledge of secondary prevention will improve Outcome: Progressing Goal: Knowledge of patient specific risk factors  addressed and post discharge goals established will improve Outcome: Progressing Goal: Individualized Educational Video(s) Outcome: Progressing   Problem: Coping: Goal: Will verbalize positive feelings about self Outcome: Progressing Goal: Will identify appropriate support needs Outcome: Progressing   Problem: Health Behavior/Discharge Planning: Goal: Ability to manage health-related needs will improve Outcome: Progressing   Problem: Self-Care: Goal: Ability to participate in self-care as condition permits will improve Outcome: Progressing Goal: Verbalization of feelings and concerns over difficulty with self-care will improve Outcome: Progressing Goal: Ability to communicate needs accurately will improve Outcome: Progressing   Problem: Nutrition: Goal: Risk of aspiration will decrease Outcome: Progressing Goal: Dietary intake will improve Outcome: Progressing   Problem: Ischemic Stroke/TIA Tissue Perfusion: Goal: Complications of ischemic stroke/TIA will be minimized Outcome: Progressing

## 2019-09-16 NOTE — Procedures (Addendum)
Patient Name: Nancy Porter  MRN: [id number]  Epilepsy Attending: Charlsie Quest  Referring Physician/Provider: Dr. Caryl Pina Duration: 09/15/2019 1553 to 09/16/2019 1054  Patient history: 49 year old female presenting with seizure-like spells.  EEG evaluate for seizures.  Level of alertness: Awake, asleep  AEDs during EEG study: gabapentin, topiramate  Technical aspects: This EEG study was done with scalp electrodes positioned according to the 10-20 International system of electrode placement. Electrical activity was acquired at a sampling rate of 500Hz  and reviewed with a high frequency filter of 70Hz  and a low frequency filter of 1Hz . EEG data were recorded continuously and digitally stored.   Description: The posterior dominant rhythm consists of 10 Hz activity of moderate voltage (25-35 uV) seen predominantly in posterior head regions, symmetric and reactive to eye opening and eye closing. Sleep was characterized by vertex waves, sleep spindles (12 to 14 Hz), maximal frontocentral region, REM sleep.    Event button was pressed on 09/15/2019 at 1849 and again at 2126.  During these events patient was noted to have nonrhythmic intermittent twitching of bilateral upper and lower extremities and not responding.  Episodes lasted for 5 to 10 minutes.  Concomitant EEG before, during and after the event did not show any EEG change to suggest seizure.  IMPRESSION: This study is within normal limits. Two events were recorded on 09/15/2019 during which patient was noted have nonrhythmic intermittent twitching of bilateral upper and lower extremities without concomitant EEG change and therefore these events were NON EPILEPTIC.  No seizures or epileptiform discharges were seen throughout the recording.  [name] 11/15/2019

## 2019-09-16 NOTE — Progress Notes (Signed)
AVS papers and education was given. IV removed and intact. Medications and belongings with the pt. Discharged home

## 2019-09-16 NOTE — Discharge Summary (Signed)
PATIENT DETAILS Name: Nancy Porter Age: 49 y.o. Sex: female Date of Birth: Nov 08, 1970 MRN: [id number]. Admitting Physician: Lewie Chamber, MD [id number], Stann Mainland, MD  Admit Date: 09/14/2019 Discharge date: 09/16/2019  Recommendations for Outpatient Follow-up:  1. Follow up with PCP in 1-2 weeks 2. Please obtain CMP/CBC in one week 3. Please ensure follow-up with patient's primary psychiatrist.  Admitted From:  Home   Disposition: Home   Home Health: No  Equipment/Devices: None  Discharge Condition: Stable  CODE STATUS: FULL CODE  Diet recommendation:  Diet Order            Diet general        Diet regular Room service appropriate? Yes; Fluid consistency: Thin  Diet effective now               Brief Narrative: Patient is a 49 y.o. female with history of migraine, anxiety/depression who presented with 1 month history of intermittent headaches, seizure-like spells, subtle right-sided weakness-subsequently admitted to the hospitalist service for further evaluation and treatment.  Significant events: 5/31>> admit for intermittent headaches, "seizure-like spells", right facial droop.  Significant studies: 6/1: EEG>> no seizures 5/31: MRI brain/MRA brain>> Negative for acute abnormalities, vessels widely patent. 5/31: CT head>> no acute abnormalities. 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  Spot EEG without any seizure-like activity.  Due to continued seizure-like activity during this hospital stay-after discussion with neurology-overnight video EEG was  performed-2 events that were recorded were nonrhythmic intermittent twitching of bilateral upper and lower extremities without concomitant EEG changes and therefore these events were thought to be nonepileptic.  No seizure or epileptiform discharges were seen throughout the recording.  Patient does have a history of anxiety-ADHD-daughter, husband/daughter did ask this MD during this hospital stay regarding medication interactions etc.Subsequently psychiatric evaluation was done-recommendations were to switch from lorazepam to Klonopin, stop Neurontin and to increase Zoloft to 200 mg daily.  Patient does not want to switch to Klonopin (see prior notes)-however she is agreeable to stop Neurontin and go up on the dosage of Zoloft.  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  She was also upset about changing lorazepam to Klonopin-and thought that Klonopin was only for psychosis.  Subsequently she was again seen by neurology this afternoon-Dr. Otelia Limes whom I spoke to-has cleared the patient for discharge. Patient aware of recommendation that she is not to drive or operate heavy machinery.  Right-sided weakness: MRI brain negative for structural lesions-very subtle/mild right-sided weakness on my exam this morning-that seems to somewhat improve when distracted.  Per neuro consult note-similar inconsistencies noted on admission yesterday.  Continues to complain of numbness on the right face and right side of the body.   Per RN and per husband-patient able to ambulate to the bathroom without any major issues.  History of migraine: Describes some burning sensation on the right side of her face but no obvious headaches-continue Topamax and Neurontin.  Psychiatry recommended discontinuation of Neurontin-spoke with Dr. Rica Mote  opined that since patient was on such a low dose of Neurontin-okay to discontinue.  Anxiety disorder: Continue  Zoloft-but has been increased to 200 mg.  For now continue with as needed lorazepam-she does not want to be switched to Klonopin.  Very tearful-stressful this afternoon due to being prescribed Klonopin, upset about pseudoseizure diagnosis-and wanting to know why she still has right-sided numbness/weakness.  Explained at length to both patient and spouse (at bedside) that further medication adjustment probably is best to be done in the outpatient setting with her primary psychiatrist-and that during her inpatient hospitalization it is not possible to optimize/adjust some of these long-term medications.  Have asked her to continue outpatient follow-up with the primary psychiatrist.  ADHD: Remains on Intun[employer]   Discharge Diagnoses:  Principal Problem:   Altered mental status Active Problems:   Chronic migraine without aura, with intractable migraine, so stated, with status migrainosus   Tobacco use   Depression   Discharge Instructions:  Activity:  As tolerated   Discharge Instructions    Call MD for:  extreme fatigue   Complete by: As directed    Call MD for:  persistant dizziness or light-headedness   Complete by: As directed    Diet general   Complete by: As directed    Discharge instructions   Complete by: As directed    Follow with Primary MD  Mick Sell, MD in 1-2 weeks  Please follow with your primary psychiatrist in the next 1-2 weeks.  Please get a complete blood count and chemistry panel checked by your Primary MD at your next visit, and again as instructed by your Primary MD.  Get Medicines reviewed and adjusted: Please take all your medications with you for your next visit with your Primary MD  Laboratory/radiological data: Please request your Primary MD to go over all hospital tests and procedure/radiological results at the follow up, please ask  your Primary MD to get all Hospital records sent to his/her office.  In some cases, they will be blood work, cultures and biopsy results pending at the time of your discharge. Please request that your primary care M.D. follows up on these results.  Also Note the following: If you experience worsening of your admission symptoms, develop shortness of breath, life threatening emergency, suicidal or homicidal thoughts you must seek medical attention immediately by calling 911 or calling your MD immediately  if symptoms less severe.  You must read complete instructions/literature along with all the possible adverse reactions/side effects for all the Medicines you take and that have been prescribed to you. Take any new Medicines after you have completely understood and accpet all the possible adverse reactions/side effects.   Do not drive when taking Pain medications or sleeping medications (Benzodaizepines)  Do not take more than prescribed Pain, Sleep and Anxiety Medications. It is not advisable to combine anxiety,sleep and pain medications without talking with your primary care practitioner  Special Instructions: If you have smoked or chewed Tobacco  in the last 2 yrs please stop smoking, stop any regular Alcohol  and or any Recreational drug use.  Wear Seat belts while driving.  Please note: You were cared for by a hospitalist during your hospital stay. Once you are discharged, your primary care physician will handle any further medical issues. Please note that NO REFILLS for any discharge medications will be authorized once you are discharged, as it is imperative that you return to your primary care physician (or establish a relationship with a primary care physician if you do not have one) for your post hospital discharge needs so that they can reassess  your need for medications and monitor your lab values.    Please do not drive unless you have had no further seizure-like episodes for 6 months.  Use caution when using heavy equipment or power tools. Avoid working on ladders or at heights. Take showers instead of baths. Ensure the water temperature is not too high on the home water heater. Do not go swimming alone. When caring for infants or small children, sit down when holding, feeding, or changing them to minimize risk of injury to the child in the event you have a seizure like episode.   Increase activity slowly   Complete by: As directed      Allergies as of 09/16/2019   No Known Allergies     Medication List    STOP taking these medications   gabapentin 100 MG capsule Commonly known as: NEURONTIN     TAKE these medications   albuterol 108 (90 Base) MCG/ACT inhaler Commonly known as: VENTOLIN HFA Inhale 2 puffs into the lungs every 6 (six) hours as needed for wheezing or shortness of breath.   fluticasone 50 MCG/ACT nasal spray Commonly known as: FLONASE SPRAY 2 SPRAYS INTO EACH NOSTRIL EVERY DAY What changed: See the new instructions.   guanFACINE 1 MG Tb24 ER tablet Commonly known as: INTUNIV Take 1 mg by mouth daily.   LORazepam 0.5 MG tablet Commonly known as: ATIVAN Take 0.5 mg by mouth 2 (two) times daily as needed for anxiety.   montelukast 10 MG tablet Commonly known as: SINGULAIR Take 1 tablet (10 mg total) by mouth at bedtime.   nortriptyline 25 MG capsule Commonly known as: PAMELOR TAKE 1 CAPSULE (25 MG TOTAL) BY MOUTH AT BEDTIME.   sertraline 100 MG tablet Commonly known as: ZOLOFT Take 2 tablets (200 mg total) by mouth at bedtime. What changed: how much to take   tolterodine 1 MG tablet Commonly known as: DETROL Take 1 mg by mouth 2 (two) times daily.   topiramate 100 MG tablet Commonly known as: TOPAMAX TAKE 1 TABLET BY MOUTH TWICE A DAY   Wixela Inhub 500-50 MCG/DOSE Aepb Generic drug: Fluticasone-Salmeterol Inhale 1 puff into the lungs in the morning and at bedtime.      Follow-up Information    Mick Sell, MD. Schedule  an appointment as soon as possible for a visit in 1 week(s).   Specialty: Infectious Diseases Contact information: [address] [place] [address] [phone]          No Known Allergies   Other Procedures/Studies: EEG  Result Date: 09/15/2019 Nancy Quest, MD     09/15/2019  1:15 PM Patient Name: Nancy Porter MRN: [id number] Epilepsy Attending: Charlsie Porter Referring Physician/Provider: Dr. Caryl Pina Date: 09/15/2019 Duration: 23.41 minutes Patient history: 49 year old female presenting with seizure-like spells.  EEG evaluate for seizures. Level of alertness: Awake, asleep AEDs during EEG study: gabapentin, topiramate Technical aspects: This EEG study was done with scalp electrodes positioned according to the 10-20 International system of electrode placement. Electrical activity was acquired at a sampling rate of  and reviewed with a high frequency filter of  and a low frequency filter of . EEG data were recorded continuously and digitally stored. Description: The posterior dominant rhythm consists of 10 Hz activity of moderate voltage (25-35 uV) seen predominantly in posterior head regions, symmetric and reactive to eye opening and eye closing. Sleep was characterized by vertex waves, sleep spindles (12 to 14 Hz), maximal frontocentral region.   Physiology  photic driving was  seen during photic stimulation.  Hyperventilation was not performed.   IMPRESSION: This study is within normal limits.  No seizures or epileptiform discharges were seen throughout the recording. Nancy Questriyanka O Porter   MR ANGIO HEAD WO CONTRAST  Result Date: 09/14/2019 CLINICAL DATA:  Acute neuro deficit.  Seizure. EXAM: MRI HEAD WITHOUT CONTRAST MRA HEAD WITHOUT CONTRAST TECHNIQUE: Multiplanar, multiecho pulse sequences of the brain and surrounding structures were obtained without intravenous contrast. Angiographic images of the head were obtained using MRA technique without contrast.  COMPARISON:  CT head 09/14/2019 FINDINGS: MRI HEAD FINDINGS Brain: No acute infarction, hemorrhage, hydrocephalus, extra-axial collection or mass lesion. No significant chronic ischemic change. Vascular: Normal arterial flow voids Skull and upper cervical spine: No focal skeletal lesion. Sinuses/Orbits: Mild mucosal edema paranasal sinuses. Normal orbit bilaterally Other: None MRA HEAD FINDINGS Internal carotid artery widely patent bilaterally. Anterior and middle cerebral arteries are normal and widely patent bilaterally Both vertebral arteries are patent without stenosis. PICA is patent bilaterally. AICA, superior cerebellar, posterior cerebral arteries widely patent without stenosis. Basilar widely patent. Negative for cerebral aneurysm. IMPRESSION: Negative MRI head Negative MRA head Electronically Signed   By: Marlan Palauharles  Clark M.D.   On: 09/14/2019 18:14   MR BRAIN WO CONTRAST  Result Date: 09/14/2019 CLINICAL DATA:  Acute neuro deficit.  Seizure. EXAM: MRI HEAD WITHOUT CONTRAST MRA HEAD WITHOUT CONTRAST TECHNIQUE: Multiplanar, multiecho pulse sequences of the brain and surrounding structures were obtained without intravenous contrast. Angiographic images of the head were obtained using MRA technique without contrast. COMPARISON:  CT head 09/14/2019 FINDINGS: MRI HEAD FINDINGS Brain: No acute infarction, hemorrhage, hydrocephalus, extra-axial collection or mass lesion. No significant chronic ischemic change. Vascular: Normal arterial flow voids Skull and upper cervical spine: No focal skeletal lesion. Sinuses/Orbits: Mild mucosal edema paranasal sinuses. Normal orbit bilaterally Other: None MRA HEAD FINDINGS Internal carotid artery widely patent bilaterally. Anterior and middle cerebral arteries are normal and widely patent bilaterally Both vertebral arteries are patent without stenosis. PICA is patent bilaterally. AICA, superior cerebellar, posterior cerebral arteries widely patent without stenosis. Basilar  widely patent. Negative for cerebral aneurysm. IMPRESSION: Negative MRI head Negative MRA head Electronically Signed   By: Marlan Palauharles  Clark M.D.   On: 09/14/2019 18:14   Overnight EEG with video  Result Date: 09/16/2019 Nancy QuestYadav, Nancy O, MD     09/16/2019 11:30 AM Patient Name: Nancy DinLisa D Porter MRN: [id number] Epilepsy Attending: Charlsie QuestPriyanka O Porter Referring Physician/Provider: Dr. Caryl PinaEric Lindzen Duration: 09/15/2019 1553 to 09/16/2019 1054  Patient history: 49 year old female presenting with seizure-like spells.  EEG evaluate for seizures.  Level of alertness: Awake, asleep  AEDs during EEG study: gabapentin, topiramate  Technical aspects: This EEG study was done with scalp electrodes positioned according to the 10-20 International system of electrode placement. Electrical activity was acquired at a sampling rate of 500Hz  and reviewed with a high frequency filter of 70Hz  and a low frequency filter of 1Hz . EEG data were recorded continuously and digitally stored.  Description: The posterior dominant rhythm consists of 10 Hz activity of moderate voltage (25-35 uV) seen predominantly in posterior head regions, symmetric and reactive to eye opening and eye closing. Sleep was characterized by vertex waves, sleep spindles (12 to 14 Hz), maximal frontocentral region, REM sleep.  Event button was pressed on 09/15/2019 at 1849 and again at 2126.  During these events patient was noted to have nonrhythmic intermittent twitching of bilateral upper and lower extremities and not responding.  Episodes lasted for 5  to 10 minutes.  Concomitant EEG before, during and after the event did not show any EEG change to suggest seizure. IMPRESSION: This study is within normal limits. Two events were recorded on 09/15/2019 during which patient was noted have nonrhythmic intermittent twitching of bilateral upper and lower extremities without concomitant EEG change and therefore these events were NON EPILEPTIC.  No seizures or epileptiform discharges  were seen throughout the recording.  Nancy Porter   MM RT BREAST BX W LOC DEV 1ST LESION IMAGE BX SPEC STEREO GUIDE  Result Date: 08/20/2019 CLINICAL DATA:  Patient presents for stereotactic guided core needle biopsy of asymmetry within the right breast on recent diagnostic mammogram. EXAM: RIGHT BREAST STEREOTACTIC CORE NEEDLE BIOPSY COMPARISON:  Previous exams. FINDINGS: The patient and I discussed the procedure of stereotactic-guided biopsy including benefits and alternatives. We discussed the high likelihood of a successful procedure. We discussed the risks of the procedure including infection, bleeding, tissue injury, clip migration, and inadequate sampling. Informed written consent was given. The usual time out protocol was performed immediately prior to the procedure. Imaging of the right breast was obtained in 3 different locations with the biopsy paddle in place for preprocedural tomosynthesis localization of the right breast asymmetry. The previously questioned asymmetry within the right breast on the cc view only did not appeared to persist on these additional series. This was discussed with the patient. The biopsy was not performed as no target was identified. IMPRESSION: No target was identified on 3 sets of preprocedural tomosynthesis imaging of the right breast on the cc view. The previously identified asymmetry may represent dense fibroglandular tissue. A right breast diagnostic mammogram and possible ultrasound in 6 months is recommended to reassess the right breast asymmetry. Recommendation: Right breast diagnostic mammogram and ultrasound in 6 months. BI-RADS category: 3: Probably benign. Electronically Signed   By: Annia Belt M.D.   On: 08/20/2019 09:57   CT HEAD CODE STROKE WO CONTRAST  Result Date: 09/14/2019 CLINICAL DATA:  Code stroke. Slurred speech facial droop. Neuro deficit. EXAM: CT HEAD WITHOUT CONTRAST TECHNIQUE: Contiguous axial images were obtained from the base of the  skull through the vertex without intravenous contrast. COMPARISON:  None. FINDINGS: Brain: No evidence of acute infarction, hemorrhage, hydrocephalus, extra-axial collection or mass lesion/mass effect. Vascular: Negative for hyperdense vessel Skull: Negative Sinuses/Orbits: Paranasal sinuses clear. Mastoid clear bilaterally. Negative orbit bilaterally. Other: None ASPECTS (Alberta Stroke Program Early CT Score) - Ganglionic level infarction (caudate, lentiform nuclei, internal capsule, insula, M1-M3 cortex): 7 - Supraganglionic infarction (M4-M6 cortex): 3 Total score (0-10 with 10 being normal): 10 IMPRESSION: 1. Negative CT head 2. ASPECTS is 10 3. Results texted to Dr. Otelia Limes Electronically Signed   By: Marlan Palau M.D.   On: 09/14/2019 17:13     TODAY-DAY OF DISCHARGE:  Subjective:   Barkley Boards today is awake and alert-she has seen neurology this afternoon-and is wanting to be discharged home.  Objective:   Blood pressure 106/66, pulse 63, temperature 99.1 F (37.3 C), temperature source Oral, resp. rate 16, weight 63.2 kg, SpO2 98 %.  Intake/Output Summary (Last 24 hours) at 09/16/2019 1830 Last data filed at 09/16/2019 1800 Gross per 24 hour  Intake 360 ml  Output --  Net 360 ml   Filed Weights   09/14/19 1733  Weight: 63.2 kg    Exam: Awake Alert, Oriented *3, No new F.N deficits, Normal affect [place].AT,PERRAL Supple Neck,No JVD, No cervical lymphadenopathy appriciated.  Symmetrical Chest wall movement, Good air  movement bilaterally, CTAB RRR,No Gallops,Rubs or new Murmurs, No Parasternal Heave +ve B.Sounds, Abd Soft, Non tender, No organomegaly appriciated, No rebound -guarding or rigidity. No Cyanosis, Clubbing or edema, No new Rash or bruise   PERTINENT RADIOLOGIC STUDIES: EEG  Result Date: 09/15/2019 Lora Havens, MD     09/15/2019  1:15 PM Patient Name: Nancy Porter MRN: [id number] Epilepsy Attending: Lora Havens Referring Physician/Provider: Dr. Kerney Elbe Date: 09/15/2019 Duration: 23.41 minutes Patient history: 49 year old female presenting with seizure-like spells.  EEG evaluate for seizures. Level of alertness: Awake, asleep AEDs during EEG study: gabapentin, topiramate Technical aspects: This EEG study was done with scalp electrodes positioned according to the 10-20 International system of electrode placement. Electrical activity was acquired at a sampling rate of 500Hz  and reviewed with a high frequency filter of 70Hz  and a low frequency filter of 1Hz . EEG data were recorded continuously and digitally stored. Description: The posterior dominant rhythm consists of 10 Hz activity of moderate voltage (25-35 uV) seen predominantly in posterior head regions, symmetric and reactive to eye opening and eye closing. Sleep was characterized by vertex waves, sleep spindles (12 to 14 Hz), maximal frontocentral region.   Physiology photic driving was  seen during photic stimulation.  Hyperventilation was not performed.   IMPRESSION: This study is within normal limits.  No seizures or epileptiform discharges were seen throughout the recording. Nancy Porter   Overnight EEG with video  Result Date: 09/16/2019 Lora Havens, MD     09/16/2019 11:30 AM Patient Name: EMBERLY TOMASSO MRN: [id number] Epilepsy Attending: Lora Havens Referring Physician/Provider: Dr. Kerney Elbe Duration: 09/15/2019 1553 to 09/16/2019 1054  Patient history: 49 year old female presenting with seizure-like spells.  EEG evaluate for seizures.  Level of alertness: Awake, asleep  AEDs during EEG study: gabapentin, topiramate  Technical aspects: This EEG study was done with scalp electrodes positioned according to the 10-20 International system of electrode placement. Electrical activity was acquired at a sampling rate of 500Hz  and reviewed with a high frequency filter of 70Hz  and a low frequency filter of 1Hz . EEG data were recorded continuously and digitally stored.  Description: The  posterior dominant rhythm consists of 10 Hz activity of moderate voltage (25-35 uV) seen predominantly in posterior head regions, symmetric and reactive to eye opening and eye closing. Sleep was characterized by vertex waves, sleep spindles (12 to 14 Hz), maximal frontocentral region, REM sleep.  Event button was pressed on 09/15/2019 at [place] and again at 2126.  During these events patient was noted to have nonrhythmic intermittent twitching of bilateral upper and lower extremities and not responding.  Episodes lasted for 5 to 10 minutes.  Concomitant EEG before, during and after the event did not show any EEG change to suggest seizure. IMPRESSION: This study is within normal limits. Two events were recorded on 09/15/2019 during which patient was noted have nonrhythmic intermittent twitching of bilateral upper and lower extremities without concomitant EEG change and therefore these events were NON EPILEPTIC.  No seizures or epileptiform discharges were seen throughout the recording.  Nancy Porter     PERTINENT LAB RESULTS: CBC: Recent Labs    09/14/19 1700 09/14/19 1700 09/14/19 1710 09/15/19 0506  WBC 11.4*  --   --  8.1  HGB 12.6   < > 12.6 13.7  HCT 38.6   < > 37.0 41.6  PLT 236  --   --  210   < > = values in this interval not  displayed.   CMET CMP     Component Value Date/Time   NA 142 09/16/2019 0728   K 3.9 09/16/2019 0728   CL 110 09/16/2019 0728   CO2 21 (L) 09/16/2019 0728   GLUCOSE 95 09/16/2019 0728   BUN 11 09/16/2019 0728   CREATININE 0.87 09/16/2019 0728   CALCIUM 9.4 09/16/2019 0728   PROT 6.7 09/14/2019 1700   ALBUMIN 3.9 09/14/2019 1700   AST 20 09/14/2019 1700   ALT 17 09/14/2019 1700   ALKPHOS 51 09/14/2019 1700   BILITOT 0.6 09/14/2019 1700   GFRNONAA >60 09/16/2019 0728   GFRAA >60 09/16/2019 0728    GFR Estimated Creatinine Clearance: 74.4 mL/min (by C-G formula based on SCr of 0.87 mg/dL). No results for input(s): LIPASE, AMYLASE in the last 72  hours. No results for input(s): CKTOTAL, CKMB, CKMBINDEX, TROPONINI in the last 72 hours. Invalid input(s): POCBNP No results for input(s): DDIMER in the last 72 hours. No results for input(s): HGBA1C in the last 72 hours. No results for input(s): CHOL, HDL, LDLCALC, TRIG, CHOLHDL, LDLDIRECT in the last 72 hours. No results for input(s): TSH, T4TOTAL, T3FREE, THYROIDAB in the last 72 hours.  Invalid input(s): FREET3 Recent Labs    09/14/19 2226  VITAMINB12 291   Coags: Recent Labs    09/14/19 1700  INR 1.0   Microbiology: Recent Results (from the past 240 hour(s))  SARS Coronavirus 2 by RT PCR (hospital order, performed in [facility] hospital lab) Nasopharyngeal Nasopharyngeal Swab     Status: None   Collection Time: 09/14/19  8:00 PM   Specimen: Nasopharyngeal Swab  Result Value Ref Range Status   SARS Coronavirus 2 NEGATIVE NEGATIVE Final    Comment: (NOTE) SARS-CoV-2 target nucleic acids are NOT DETECTED. The SARS-CoV-2 RNA is generally detectable in upper and lower respiratory specimens during the acute phase of infection. The lowest concentration of SARS-CoV-2 viral copies this assay can detect is 250 copies / mL. A negative result does not preclude SARS-CoV-2 infection and should not be used as the sole basis for treatment or other patient management decisions.  A negative result may occur with improper specimen collection / handling, submission of specimen other than nasopharyngeal swab, presence of viral mutation(s) within the areas targeted by this assay, and inadequate number of viral copies (<250 copies / mL). A negative result must be combined with clinical observations, patient history, and epidemiological information. Fact Sheet for Patients:   BoilerBrush.com.cy Fact Sheet for Healthcare Providers: https://pope.com/ This test is not yet approved or cleared  by the [place] FDA and has been authorized for  detection and/or diagnosis of SARS-CoV-2 by FDA under an Emergency Use Authorization (EUA).  This EUA will remain in effect (meaning this test can be used) for the duration of the COVID-19 declaration under Section 564(b)(1) of the Act, 21 U.S.C. section 360bbb-3(b)(1), unless the authorization is terminated or revoked sooner. Performed at [facility], [address] [place], [place] [address]     FURTHER DISCHARGE INSTRUCTIONS:  Get Medicines reviewed and adjusted: Please take all your medications with you for your next visit with your Primary MD  Laboratory/radiological data: Please request your Primary MD to go over all hospital tests and procedure/radiological results at the follow up, please ask your Primary MD to get all Hospital records sent to his/her office.  In some cases, they will be blood work, cultures and biopsy results pending at the time of your discharge. Please request that your primary  care M.D. goes through all the records of your hospital data and follows up on these results.  Also Note the following: If you experience worsening of your admission symptoms, develop shortness of breath, life threatening emergency, suicidal or homicidal thoughts you must seek medical attention immediately by calling 911 or calling your MD immediately  if symptoms less severe.  You must read complete instructions/literature along with all the possible adverse reactions/side effects for all the Medicines you take and that have been prescribed to you. Take any new Medicines after you have completely understood and accpet all the possible adverse reactions/side effects.   Do not drive when taking Pain medications or sleeping medications (Benzodaizepines)  Do not take more than prescribed Pain, Sleep and Anxiety Medications. It is not advisable to combine anxiety,sleep and pain medications without talking with your primary care practitioner  Special Instructions: If you have smoked  or chewed Tobacco  in the last 2 yrs please stop smoking, stop any regular Alcohol  and or any Recreational drug use.  Wear Seat belts while driving.  Please note: You were cared for by a hospitalist during your hospital stay. Once you are discharged, your primary care physician will handle any further medical issues. Please note that NO REFILLS for any discharge medications will be authorized once you are discharged, as it is imperative that you return to your primary care physician (or establish a relationship with a primary care physician if you do not have one) for your post hospital discharge needs so that they can reassess your need for medications and monitor your lab values.  Total Time spent coordinating discharge including counseling, education and face to face time equals 35 minutes.  SignedJeoffrey Massed 09/16/2019 6:30 PM

## 2019-09-16 NOTE — Consult Note (Signed)
[facility] Consult   Reason for Consult:  "stress/amxiety-psychogenic right sided weaknes-pseudoseizures-on multiple meds-please assess for med management" Referring Physician:  Dr. Sloan Leiter Patient Identification: Nancy Porter MRN:  [id number] Principal Diagnosis: Altered mental status Diagnosis:  Principal Problem:   Altered mental status Active Problems:   Chronic migraine without aura, with intractable migraine, so stated, with status migrainosus   Tobacco use   Depression   Total Time spent with patient: 45 minutes  Subjective: Patient states "I am here at the hospital because I keep having seizures for the past month."  HPI: Nancy Porter is a 49 y.o. female patient.  Patient assessed by nurse practitioner.  Patient alert and oriented, answers appropriately.  Patient request that husband remain at bedside, Eligah East. Patient reports seizure episodes began approximately 1 month ago.  Patient reports approximately 10 episodes total.  Patient reports "I feel it coming on, the right side of my face feels distorted and tingles and twitches and pulls up.  Then I sit down or lie down and wait for it to pass.  I think I get over anxious." Patient currently denies suicidal ideations.  Patient reports history of suicide attempts x3, last attempt approximately 12 years ago.  Patient denies self-harm behaviors.  Patient denies homicidal ideations.  Patient denies auditory visual hallucinations.  Patient denies symptoms of paranoia. Patient reports she resides with her husband and adult daughter.  Patient denies access to weapons.  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  Patient reports sleep and appetite are "good." Patient reports she is seen by outpatient psychiatry,  established with psychiatrist "Olivia Mackie" approximately 1 year ago but has only met with provider by phone related to Covid.  Patient reports she is treated for anxiety and depression.  Patient home meds include Ativan, Neurontin, Intuniv, nortriptyline, Zoloft and Topamax. Patient gives verbal consent to speak with husband at bedside, Kinzleigh Kandler.  Patient's husband denies concern for patient safety.  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   Past Psychiatric History: Anxiety, depression  Risk to Self:  Denies, Prior x 3- last time 12 years ago Risk to Others:   Denies Prior Inpatient Therapy:   14 years ago in [place] Prior Outpatient Therapy:   current psychiatrist Olivia Mackie (unable to recall last name of provider, unable to recall name of practice)- sees virtually  Past Medical History:  Past Medical History:  Diagnosis Date  . Anxiety   . Arrhythmia   . Depression   . Deviated septum   . Hx of migraines   . Hx of suicide attempt 2010   overdose on Trileptal, has been on Seroquel in the past  . Hyperlipidemia   . Tobacco use     Past Surgical History:  Procedure Laterality Date  . ABDOMINAL HYSTERECTOMY    . AUGMENTATION MAMMAPLASTY Bilateral    implants 30 years ago-saline  . BREAST BIOPSY Right 08/19/2019   Affirm bx-"coil" clip-path pending  . BREAST ENHANCEMENT SURGERY    . CESAREAN SECTION     x 2  . SHOULDER SURGERY Left    impingement   Family History:  Family History  Problem Relation Age of Onset  . Glaucoma Mother   . Heart disease Mother   .  Heart Problems Mother   . Arrhythmia Mother        fib  . Deep vein thrombosis Father   . Heart Problems Father   . Heart disease Father   . Migraines Sister   . Stroke Maternal Grandmother   . Aneurysm Maternal Grandmother   . Heart disease Maternal Grandmother   . Diabetes Paternal Uncle        adult onset  . CAD Maternal Grandfather     Family Psychiatric  History: None reported Social History:  Social History   Substance and Sexual Activity  Alcohol Use Yes   Comment: occ maybe 1-2 a month     Social History   Substance and Sexual Activity  Drug Use Yes  . Types: Marijuana   Comment: cocaine in the past    Social History   Socioeconomic History  . Marital status: Married    Spouse name: Eddie Dibbles  . Number of children: 2  . Years of education: some college  . Highest education level: Some college, no degree  Occupational History  . Occupation: Office manager: [employer]  Tobacco Use  . Smoking status: Former Smoker    Packs/day: 1.00    Years: 35.00    Pack years: 35.00    Types: Cigarettes    Quit date: 01/08/2019    Years since quitting: 0.6  . Smokeless tobacco: Never Used  Substance and Sexual Activity  . Alcohol use: Yes    Comment: occ maybe 1-2 a month  . Drug use: Yes    Types: Marijuana    Comment: cocaine in the past  . Sexual activity: Not on file  Other Topics Concern  . Not on file  Social History Narrative   Patient is left-handed. She lives with her husband and daughter in a 1 story house.  She has not been exercising in the last year.    3 cups coffee daily.   Social Determinants of Health   Financial Resource Strain:   . Difficulty of Paying Living Expenses:   Food Insecurity:   . Worried About Charity fundraiser in the Last Year:   . Arboriculturist in the Last Year:   Transportation Needs:   . Film/video editor (Medical):   Marland Kitchen Lack of Transportation (Non-Medical):   Physical Activity:   . Days of Exercise per Week:   . Minutes of Exercise per Session:   Stress:   . Feeling of Stress :   Social Connections:   . Frequency of Communication with Friends and Family:   . Frequency of Social Gatherings with Friends and Family:   . Attends Religious Services:   . Active Member of Clubs or Organizations:   . Attends Archivist  Meetings:   Marland Kitchen Marital Status:    Additional Social History:    Allergies:  No Known Allergies  Labs:  Results for orders placed or performed during the hospital encounter of 09/14/19 (from the past 48 hour(s))  CBG monitoring, ED     Status: None   Collection Time: 09/14/19  4:59 PM  Result Value Ref Range   Glucose-Capillary 91 70 - 99 mg/dL    Comment: Glucose reference range applies only to samples taken after fasting for at least 8 hours.  Ethanol     Status: None   Collection Time: 09/14/19  5:00 PM  Result Value Ref Range   Alcohol, Ethyl (B) <10 <10 mg/dL  Comment: (NOTE) Lowest detectable limit for serum alcohol is 10 mg/dL. For medical purposes only. Performed at [facility], [place] [address], [place], [place] [address]   Protime-INR     Status: None   Collection Time: 09/14/19  5:00 PM  Result Value Ref Range   Prothrombin Time 12.6 11.4 - 15.2 seconds   INR 1.0 0.8 - 1.2    Comment: (NOTE) INR goal varies based on device and disease states. Performed at [facility], [place] [address] [place], [place] [address]   APTT     Status: None   Collection Time: 09/14/19  5:00 PM  Result Value Ref Range   aPTT 29 24 - 36 seconds    Comment: Performed at [facility] [address], [place], [place] [address]  CBC     Status: Abnormal   Collection Time: 09/14/19  5:00 PM  Result Value Ref Range   WBC 11.4 (H) 4.0 - 10.5 K/uL   RBC 4.08 3.87 - 5.11 MIL/uL   Hemoglobin 12.6 12.0 - 15.0 g/dL   HCT 38.6 36.0 - 46.0 %   MCV 94.6 80.0 - 100.0 fL   MCH 30.9 26.0 - 34.0 pg   MCHC 32.6 30.0 - 36.0 g/dL   RDW 12.4 11.5 - 15.5 %   Platelets 236 150 - 400 K/uL   nRBC 0.0 0.0 - 0.2 %    Comment: Performed at [facility], [place] [address] [place], [place] [address]  Differential     Status: None   Collection Time: 09/14/19  5:00 PM  Result Value Ref Range   Neutrophils Relative % 63 %   Neutro Abs 7.3 1.7 - 7.7 K/uL   Lymphocytes Relative 27 %    Lymphs Abs 3.1 0.7 - 4.0 K/uL   Monocytes Relative 7 %   Monocytes Absolute 0.8 0.1 - 1.0 K/uL   Eosinophils Relative 2 %   Eosinophils Absolute 0.2 0.0 - 0.5 K/uL   Basophils Relative 1 %   Basophils Absolute 0.1 0.0 - 0.1 K/uL   Immature Granulocytes 0 %   Abs Immature Granulocytes 0.03 0.00 - 0.07 K/uL    Comment: Performed at [facility] [address], [place], [place] [address]  Comprehensive metabolic panel     Status: Abnormal   Collection Time: 09/14/19  5:00 PM  Result Value Ref Range   Sodium 142 135 - 145 mmol/L   Potassium 3.5 3.5 - 5.1 mmol/L   Chloride 111 98 - 111 mmol/L   CO2 23 22 - 32 mmol/L   Glucose, Bld 89 70 - 99 mg/dL    Comment: Glucose reference range applies only to samples taken after fasting for at least 8 hours.   BUN 13 6 - 20 mg/dL   Creatinine, Ser 1.03 (H) 0.44 - 1.00 mg/dL   Calcium 9.3 8.9 - 10.3 mg/dL   Total Protein 6.7 6.5 - 8.1 g/dL   Albumin 3.9 3.5 - 5.0 g/dL   AST 20 15 - 41 U/L   ALT 17 0 - 44 U/L   Alkaline Phosphatase 51 38 - 126 U/L   Total Bilirubin 0.6 0.3 - 1.2 mg/dL   GFR calc non Af Amer >60 >60 mL/min   GFR calc Af Amer >60 >60 mL/min   Anion gap 8 5 - 15    Comment: Performed at [facility], [place] [address], [place], [place] [address]  I-Stat beta hCG blood, ED  Status: None   Collection Time: 09/14/19  5:06 PM  Result Value Ref Range   I-stat hCG, quantitative <5.0 <5 mIU/mL   Comment 3            Comment:   GEST. AGE      CONC.  (mIU/mL)   <=1 WEEK        5 - 50     2 WEEKS       50 - 500     3 WEEKS       100 - 10,000     4 WEEKS     1,000 - 30,000        FEMALE AND NON-PREGNANT FEMALE:     LESS THAN 5 mIU/mL   I-stat chem 8, ED     Status: Abnormal   Collection Time: 09/14/19  5:10 PM  Result Value Ref Range   Sodium 141 135 - 145 mmol/L   Potassium 3.4 (L) 3.5 - 5.1 mmol/L   Chloride 109 98 - 111 mmol/L   BUN 14 6 - 20 mg/dL   Creatinine, Ser 1.00 0.44 - 1.00 mg/dL   Glucose, Bld 83 70 - 99  mg/dL    Comment: Glucose reference range applies only to samples taken after fasting for at least 8 hours.   Calcium, Ion 1.23 1.15 - 1.40 mmol/L   TCO2 22 22 - 32 mmol/L   Hemoglobin 12.6 12.0 - 15.0 g/dL   HCT 37.0 36.0 - 46.0 %  SARS Coronavirus 2 by RT PCR (hospital order, performed in [facility] hospital lab) Nasopharyngeal Nasopharyngeal Swab     Status: None   Collection Time: 09/14/19  8:00 PM   Specimen: Nasopharyngeal Swab  Result Value Ref Range   SARS Coronavirus 2 NEGATIVE NEGATIVE    Comment: (NOTE) SARS-CoV-2 target nucleic acids are NOT DETECTED. The SARS-CoV-2 RNA is generally detectable in upper and lower respiratory specimens during the acute phase of infection. The lowest concentration of SARS-CoV-2 viral copies this assay can detect is 250 copies / mL. A negative result does not preclude SARS-CoV-2 infection and should not be used as the sole basis for treatment or other patient management decisions.  A negative result may occur with improper specimen collection / handling, submission of specimen other than nasopharyngeal swab, presence of viral mutation(s) within the areas targeted by this assay, and inadequate number of viral copies (<250 copies / mL). A negative result must be combined with clinical observations, patient history, and epidemiological information. Fact Sheet for Patients:   StrictlyIdeas.no Fact Sheet for Healthcare Providers: BankingDealers.co.za This test is not yet approved or cleared  by the [place] FDA and has been authorized for detection and/or diagnosis of SARS-CoV-2 by FDA under an Emergency Use Authorization (EUA).  This EUA will remain in effect (meaning this test can be used) for the duration of the COVID-19 declaration under Section 564(b)(1) of the Act, 21 U.S.C. section 360bbb-3(b)(1), unless the authorization is terminated or revoked sooner. Performed at [facility], [place] [address], [place], [place] [address]   Urine rapid drug screen (hosp performed)     Status: Abnormal   Collection Time: 09/14/19  9:20 PM  Result Value Ref Range   Opiates NONE DETECTED NONE DETECTED   Cocaine NONE DETECTED NONE DETECTED   Benzodiazepines POSITIVE (A) NONE DETECTED   Amphetamines NONE DETECTED NONE DETECTED   Tetrahydrocannabinol POSITIVE (A) NONE DETECTED   Barbiturates NONE DETECTED NONE DETECTED    Comment: (NOTE)  DRUG SCREEN FOR MEDICAL PURPOSES ONLY.  IF CONFIRMATION IS NEEDED FOR ANY PURPOSE, NOTIFY LAB WITHIN 5 DAYS. LOWEST DETECTABLE LIMITS FOR URINE DRUG SCREEN Drug Class                     Cutoff (ng/mL) Amphetamine and metabolites    1000 Barbiturate and metabolites    200 Benzodiazepine                 235 Tricyclics and metabolites     300 Opiates and metabolites        300 Cocaine and metabolites        300 THC                            50 Performed at [facility], [place] [address], [place], [place] [address]   Urinalysis, Routine w reflex microscopic     Status: None   Collection Time: 09/14/19  9:20 PM  Result Value Ref Range   Color, Urine YELLOW YELLOW   APPearance CLEAR CLEAR   Specific Gravity, Urine 1.011 1.005 - 1.030   pH 6.0 5.0 - 8.0   Glucose, UA NEGATIVE NEGATIVE mg/dL   Hgb urine dipstick NEGATIVE NEGATIVE   Bilirubin Urine NEGATIVE NEGATIVE   Ketones, ur NEGATIVE NEGATIVE mg/dL   Protein, ur NEGATIVE NEGATIVE mg/dL   Nitrite NEGATIVE NEGATIVE   Leukocytes,Ua NEGATIVE NEGATIVE    Comment: Performed at [facility] [address], [place], [place] [address]  Vitamin B12     Status: None   Collection Time: 09/14/19 10:26 PM  Result Value Ref Range   Vitamin B-12 291 180 - 914 pg/mL    Comment: (NOTE) This assay is not validated for testing neonatal or myeloproliferative syndrome specimens for Vitamin B12 levels. Performed at [facility], [place] [address] [place],  [address]   Basic  metabolic panel     Status: Abnormal   Collection Time: 09/15/19  5:06 AM  Result Value Ref Range   Sodium 141 135 - 145 mmol/L   Potassium 3.4 (L) 3.5 - 5.1 mmol/L   Chloride 111 98 - 111 mmol/L   CO2 21 (L) 22 - 32 mmol/L   Glucose, Bld 98 70 - 99 mg/dL    Comment: Glucose reference range applies only to samples taken after fasting for at least 8 hours.   BUN 11 6 - 20 mg/dL   Creatinine, Ser 0.86 0.44 - 1.00 mg/dL   Calcium 9.3 8.9 - 10.3 mg/dL   GFR calc non Af Amer >60 >60 mL/min   GFR calc Af Amer >60 >60 mL/min   Anion gap 9 5 - 15    Comment: Performed at [facility] [address] [place] [address]  CBC     Status: None   Collection Time: 09/15/19  5:06 AM  Result Value Ref Range   WBC 8.1 4.0 - 10.5 K/uL   RBC 4.38 3.87 - 5.11 MIL/uL   Hemoglobin 13.7 12.0 - 15.0 g/dL   HCT 41.6 36.0 - 46.0 %   MCV 95.0 80.0 - 100.0 fL   MCH 31.3 26.0 - 34.0 pg   MCHC 32.9 30.0 - 36.0 g/dL   RDW 12.5 11.5 - 15.5 %   Platelets 210 150 - 400 K/uL   nRBC 0.0 0.0 - 0.2 %    Comment: Performed at [facility], [address] [place], [place] [address]  Magnesium     Status: None   Collection Time: 09/15/19  5:06 AM  Result Value Ref Range   Magnesium 2.2 1.7 - 2.4 mg/dL    Comment: Performed at [facility], [place] [address], [place], [place] [address]  Basic metabolic panel     Status: Abnormal   Collection Time: 09/16/19  7:28 AM  Result Value Ref Range   Sodium 142 135 - 145 mmol/L   Potassium 3.9 3.5 - 5.1 mmol/L   Chloride 110 98 - 111 mmol/L   CO2 21 (L) 22 - 32 mmol/L   Glucose, Bld 95 70 - 99 mg/dL    Comment: Glucose reference range applies only to samples taken after fasting for at least 8 hours.   BUN 11 6 - 20 mg/dL   Creatinine, Ser 0.87 0.44 - 1.00 mg/dL   Calcium 9.4 8.9 - 10.3 mg/dL   GFR calc non Af Amer >60 >60 mL/min   GFR calc Af Amer >60 >60 mL/min   Anion gap 11 5 - 15    Comment: Performed at [facility]  [address] [place], [place] [address]    Current Facility-Administered Medications  Medication Dose Route Frequency Provider Last Rate Last Admin  . acetaminophen (TYLENOL) tablet 650 mg  650 mg Oral Q4H PRN Dwyane Dee, MD   650 mg at 09/16/19 0848  . albuterol (PROVENTIL) (2.5 MG/3ML) 0.083% nebulizer solution 2.5 mg  2.5 mg Nebulization Q6H PRN Ghimire, Henreitta Leber, MD      . cyanocobalamin ((VITAMIN B-12)) injection 1,000 mcg  1,000 mcg Subcutaneous Daily Jonetta Osgood, MD   1,000 mcg at 09/16/19 0849  . enoxaparin (LOVENOX) injection 40 mg  40 mg Subcutaneous Q24H Dwyane Dee, MD   40 mg at 09/15/19 2059  . gabapentin (NEURONTIN) capsule 100 mg  100 mg Oral BID Dwyane Dee, MD   100 mg at 09/16/19 0848  . guanFACINE (INTUNIV) ER tablet 1 mg  1 mg Oral Daily Jonetta Osgood, MD   1 mg at 09/16/19 0849  . LORazepam (ATIVAN) tablet 0.5 mg  0.5 mg Oral BID PRN Dwyane Dee, MD   0.5 mg at 09/16/19 0848  . mometasone-formoterol (DULERA) 200-5 MCG/ACT inhaler 2 puff  2 puff Inhalation BID Jonetta Osgood, MD   2 puff at 09/16/19 0845  . nicotine (NICODERM CQ - dosed in mg/24 hours) patch 14 mg  14 mg Transdermal Daily Fair, Marin Shutter, MD   14 mg at 09/15/19 0929  . nortriptyline (PAMELOR) capsule 25 mg  25 mg Oral Standley Brooking, MD   25 mg at 09/15/19 2058  . ondansetron (ZOFRAN) tablet 4 mg  4 mg Oral Q6H PRN Dwyane Dee, MD      . oxybutynin (DITROPAN-XL) 24 hr tablet 5 mg  5 mg Oral QHS Jonetta Osgood, MD   5 mg at 09/15/19 2103  . sertraline (ZOLOFT) tablet 150 mg  150 mg Oral Standley Brooking, MD   150 mg at 09/15/19 2058  . topiramate (TOPAMAX) tablet 100 mg  100 mg Oral BID Dwyane Dee, MD   100 mg at 09/16/19 0848    Musculoskeletal: Strength & Muscle Tone: within normal limits Gait & Station: Unable to assess Patient leans: N/A  Psychiatric Specialty Exam: Physical Exam  Nursing note and vitals reviewed. Constitutional: She is oriented to person,  place, and time. She appears well-developed and well-nourished.  HENT:  Head: Normocephalic.  Cardiovascular: Normal rate.  Respiratory: Effort normal.  Neurological: She is alert and oriented to person, place, and time.  Psychiatric: She has a normal mood and affect. Her speech is normal and behavior is normal. Judgment and thought content normal. Cognition and memory are normal.    Review of Systems  Constitutional: Negative.   HENT: Negative.   Eyes: Negative.   Respiratory: Negative.   Cardiovascular: Negative.   Gastrointestinal: Negative.   Genitourinary: Negative.   Musculoskeletal: Negative.   Skin: Negative.   Neurological: Negative.   Psychiatric/Behavioral: Negative.     Blood pressure 124/81, pulse 63, temperature (!) 101 F (38.3 C), temperature source Oral, resp. rate 14, weight 63.2 kg, SpO2 99 %.Body mass index is 21.82 kg/m.  General Appearance: Casual and Fairly Groomed  Eye Contact:  Good  Speech:  Clear and Coherent and Normal Rate  Volume:  Normal  Mood:  Euthymic  Affect:  Congruent  Thought Process:  Coherent, Goal Directed and Descriptions of Associations: Intact  Orientation:  Full (Time, Place, and Person)  Thought Content:  WDL and Logical  Suicidal Thoughts:  No  Homicidal Thoughts:  No  Memory:  Immediate;   Good Recent;   Good Remote;   Good  Judgement:  Fair  Insight:  Fair  Psychomotor Activity:  Normal  Concentration:  Concentration: Good and Attention Span: Good  Recall:  Good  Fund of Knowledge:  Good  Language:  Good  Akathisia:  No  Handed:  Right  AIMS (if indicated):     Assets:  Communication Skills Desire for Improvement Financial Resources/Insurance Housing Intimacy Leisure Time Physical Health Resilience Social Support  ADL's:  Unable to assess   Cognition:  WNL  Sleep:        Treatment Plan Summary: Patient discussed Dr. Dwyane Dee.  Patient is a 49 year old female with history of anxiety and depression admitted  with chief complaint of altered mental status and shaking.  Patient is pleasant and cooperative, denies depressive symptoms, mood swings and psychosis.  Based on my evaluation today, patient would likely benefit from follow-up with established outpatient psychiatry and initiation of outpatient talk therapy.  Recommendations: Anxiety: -Recommend consider discontinue Ativan 0.5 mg twice daily, initiate Klonopin 0.5 mg twice daily.  Depression: -Recommend continue nortriptyline 25 mg nightly. -Recommend increase Zoloft from 150 mg nightly to 200 mg nightly.  Seizure disorder: -Recommend continue Topamax 100 mg twice daily. -Recommend discontinue Neurontin 100 mg twice daily.    Disposition: No evidence of imminent risk to self or others at present.   Patient does not meet criteria for psychiatric inpatient admission. Supportive therapy provided about ongoing stressors.  Emmaline Kluver, FNP 09/16/2019 9:44 AM

## 2019-09-16 NOTE — Progress Notes (Signed)
Home medications are taken from pharmacy and returned to the patient

## 2019-09-16 NOTE — Progress Notes (Signed)
Called from the Epilepsy Tech that the pt was having an event. RN went into the room and cut on the lights. Asked the pt to squeeze hands and it was weak. Bilateral arm jerking and right side of face was upward to the right. In the bridge of her nose was twitching and she was making noise. Uncovered her legs and both were jerking. Pt was able to press down and hold her legs for a second. After pt was unable to follow commands. Legs had stopped jerking and her arms were still jerking. The pt had started to calm down and I had asked her husband when did they start. He stated about a month ago and then the pt started to have another one. Her face and arms was the only movements and it lasted for a minute. The tech notified the MD. VSS after the episode and the pt was able to speak but was speaking in a soft voice. RT was coming in to give her a breathing treatment and she got loud when she was speaking about her medication.

## 2019-09-16 NOTE — Progress Notes (Signed)
Informed by RN-Neuro eval complete-recommendations were for discharge and NOT to start Klonopin. Confirmed with Dr Otelia Limes. See d/c summary for details.

## 2019-09-16 NOTE — Progress Notes (Signed)
Was called again to the bedside by husband. Same type of movements as prior, Tachypnea, but slower than prior, moving up or down slightly arms and extends feet. Moving mouth, teeth heard. Husband recorded these movements on the video. As soon as it was done, pt raised HOB, and asked for some water. Looks exhausted (from so much movements). Ativan was given. Neuro MD aware and at the bedside at this time. Will continue to monitor

## 2019-09-16 NOTE — Progress Notes (Signed)
Pt is upset, after finding out Klonopin was ordered. "I am not Psychotic, I need to speak to Neuro doc". MD and Neuro MD notified. Both are aware. Will return and will talk to the pt as available.

## 2019-09-16 NOTE — Progress Notes (Signed)
Was called at the bedside by her husband for seizure activity. Upon assessment, pt was breathing in/out very fast, moving her extremities fast as wel, but very regular (not chaotic)  Safety assured (Suction at the bedside, pads. O2 decreased. O2 [place] applied at 2 L. After 3 minutes, standing quietly and observing this situation, pt opened slowly eyes, said "baby", everything slowed down and pt calmly started to speak to the husband. MD notified and aware. Spoke to husband. Pt at this time, alert, oriented right away, and asking appropriate questions. Will continue to monitor
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
## 2019-09-16 NOTE — Progress Notes (Addendum)
Informed by RN-that patient upset about Klonopin-came to bedside-explained rationale-behind changing from Ativan to Klonopin based on my conversation with Dr Otelia Limes and Psych eval. Patient also upset that we are labeling this as psychogenic/non epileptic seizures-explained again (did this morning as well) regarding negative LTM EEG findings. Initially yelling and using profanities.  Claiming that this MD labeled her as crazy or psychotic (which I never did).  She was concerned that I told her and her husband that there was no specific treatment for these type of seizures-apparently she claims that this is not what the neurologist told her.  I did explain to her that there was really no specific medication that I could give her for pseudoseizures, and that I did explain to her and her husband this morning that she would need continued counseling, follow-up with a psychiatrist etc. for these type of seizures-and that it would take time and effort before seeing any tangible improvement. Explained that it is not always possible to discuss every small medication management right away (made a few hours back), generally as a practice I explain the next time I see patient or at discharge. Reassurance provided-that changes were done in conjunction with Neuro and Psych MD. Patient feels that psych note/input would sway Neurology opinion-explained that as a Physician that Dr Otelia Limes would give his own opinion.  Patient/family wanting to talk with Dr Otelia Limes.   Patient ok with stopping Neurontin, and increasing Zoloft to 200 mg. Stop Klonopin-as she will NOT take it-will switch back to Lorazepam.  After spending extensive amount of time with her and her husband at bedside-and providing rationale/explanation for numerous concerns including Klonopin/ psychiatric evaluation/medication management etc patient was much more calm and understanding.  Associate Dept Director-[place] at bedside as well.

## 2019-09-16 NOTE — Progress Notes (Signed)
Melynda Ripple, MD called and asked about the event and how was the pt. Updated the MD with the information noted in the notes. Also, let the MD know that the prn ativan was held earlier because the pt's BP was lower than her baseline. MD stated not to give any PRN ativan at this time. Pt is stable in room with husband at bedside.

## 2019-09-16 NOTE — Plan of Care (Signed)
Pt is adequate to DC home. A/Ox4, VS stable

## 2019-09-16 NOTE — Progress Notes (Signed)
PT Cancellation Note  Patient Details Name: MARNY SMETHERS MRN: [id number] DOB: Jun 26, 1970   Cancelled Treatment:    Reason Eval/Treat Not Completed: Patient asleep in bed. Per spouse report she just fell asleep after a "bad night". He request to let her sleep and check back later. Will follow up later today as time allows vs another date.   Sallyanne Kuster, PTA, CLT Acute Rehab Services [phone] 09/16/19, 9:44 AM   Sallyanne Kuster 09/16/2019, 9:43 AM

## 2019-09-17 ENCOUNTER — Telehealth: Payer: Self-pay | Admitting: Neurology

## 2019-09-17 NOTE — Telephone Encounter (Signed)
Pt was seen @ [facility] on 6/2 for seziure like activity and was referred back to you pt wanted to been seen asap. I gave the info to Dr. Pearlean Brownie and he looked it over and said that it was ok to sched apt with you at next available that it was not urgent. We sched pt for 11/04/2019 pt is wanting to speak to you and would like a call back ASAP.

## 2019-09-18 ENCOUNTER — Other Ambulatory Visit: Payer: Self-pay | Admitting: Critical Care Medicine

## 2019-09-18 DIAGNOSIS — R053 Chronic cough: Secondary | ICD-10-CM

## 2019-09-21 NOTE — ED Provider Notes (Signed)
[facility] Provider Note   CSN: [id number] Arrival date & time: 09/14/19  1658     History Chief Complaint  Patient presents with  . Code Stroke    Nancy Porter is a 49 y.o. female.  HPI    49 y.o. female who presents from home as a Code Stroke after a seizure-like spell at home today, witnessed by family, followed by 2 additional seizure-like episodes while en route with EMS. Prior to the first seizure, she experienced acute onset of dysarthria at about 4 PM. Per family, the first seizure occurred at about 4:30 PM and lasted for 60-90 seconds. On EMS arrival, she was conversant with a right facial droop. T The first seizure seen by EMS aborted spontaneously after 60-90 seconds. The second seizure was aborted with 2 mg IV Versed. On arrival to the ED the patient appeared postictal/sedated. No further seizure-like activity has been seen.    Past Medical History:  Diagnosis Date  . Anxiety   . Arrhythmia   . Depression   . Deviated septum   . Hx of migraines   . Hx of suicide attempt 2010   overdose on Trileptal, has been on Seroquel in the past  . Hyperlipidemia   . Tobacco use     Patient Active Problem List   Diagnosis Date Noted  . Altered mental status 09/14/2019  . Tobacco use   . Depression   . Chronic migraine without aura, with intractable migraine, so stated, with status migrainosus 12/30/2017    Past Surgical History:  Procedure Laterality Date  . ABDOMINAL HYSTERECTOMY    . AUGMENTATION MAMMAPLASTY Bilateral    implants 30 years ago-saline  . BREAST BIOPSY Right 08/19/2019   Affirm bx-"coil" clip-path pending  . BREAST ENHANCEMENT SURGERY    . CESAREAN SECTION     x 2  . SHOULDER SURGERY Left    impingement     OB History   No obstetric history on file.     Family History  Problem Relation Age of Onset  . Glaucoma Mother   . Heart disease Mother   . Heart Problems Mother   . Arrhythmia Mother        fib  . Deep  vein thrombosis Father   . Heart Problems Father   . Heart disease Father   . Migraines Sister   . Stroke Maternal Grandmother   . Aneurysm Maternal Grandmother   . Heart disease Maternal Grandmother   . Diabetes Paternal Uncle        adult onset  . CAD Maternal Grandfather     Social History   Tobacco Use  . Smoking status: Former Smoker    Packs/day: 1.00    Years: 35.00    Pack years: 35.00    Types: Cigarettes    Quit date: 01/08/2019    Years since quitting: 0.7  . Smokeless tobacco: Never Used  Substance Use Topics  . Alcohol use: Yes    Comment: occ maybe 1-2 a month  . Drug use: Yes    Types: Marijuana    Comment: cocaine in the past    Home Medications Prior to Admission medications   Medication Sig Start Date End Date Taking? Authorizing Provider  albuterol (VENTOLIN HFA) 108 (90 Base) MCG/ACT inhaler Inhale 2 puffs into the lungs every 6 (six) hours as needed for wheezing or shortness of breath. 01/22/19  Yes Karie Fetch P, DO  Fluticasone-Salmeterol (WIXELA INHUB) 500-50 MCG/DOSE AEPB Inhale 1  puff into the lungs in the morning and at bedtime.  05/12/19  Yes [provider]  guanFACINE (INTUNIV) 1 MG TB24 ER tablet Take 1 mg by mouth daily. 07/22/19  Yes [provider]  LORazepam (ATIVAN) 0.5 MG tablet Take 0.5 mg by mouth 2 (two) times daily as needed for anxiety.  07/12/19  Yes [provider]  montelukast (SINGULAIR) 10 MG tablet Take 1 tablet (10 mg total) by mouth at bedtime. 05/12/19  Yes Noemi Chapel P, DO  nortriptyline (PAMELOR) 25 MG capsule TAKE 1 CAPSULE (25 MG TOTAL) BY MOUTH AT BEDTIME. 03/31/19  Yes Melvenia Beam, MD  tolterodine (DETROL) 1 MG tablet Take 1 mg by mouth 2 (two) times daily. 07/22/19  Yes [provider]  topiramate (TOPAMAX) 100 MG tablet TAKE 1 TABLET BY MOUTH TWICE A DAY Patient taking differently: Take 100 mg by mouth 2 (two) times daily.  07/23/19  Yes Melvenia Beam, MD  fluticasone (FLONASE) 50  MCG/ACT nasal spray SPRAY 2 SPRAYS INTO EACH NOSTRIL EVERY DAY 09/18/19   Noemi Chapel P, DO  sertraline (ZOLOFT) 100 MG tablet Take 2 tablets (200 mg total) by mouth at bedtime. 09/16/19 10/16/19  Ghimire, Henreitta Leber, MD    Allergies    Patient has no known allergies.  Review of Systems   Review of Systems  Unable to perform ROS: Acuity of condition  All other systems reviewed and are negative.   Physical Exam Updated Vital Signs BP 106/66 (BP Location: Left Arm)   Pulse 63   Temp 99.1 F (37.3 C) (Oral)   Resp 16   Wt 63.2 kg   LMP  (LMP Unknown)   SpO2 98%   BMI 21.82 kg/m   Physical Exam Vitals and nursing note reviewed.  Constitutional:      Appearance: She is well-developed.  HENT:     Head: Normocephalic and atraumatic.  Cardiovascular:     Rate and Rhythm: Normal rate.  Pulmonary:     Effort: Pulmonary effort is normal.  Abdominal:     General: Bowel sounds are normal.  Musculoskeletal:     Cervical back: Normal range of motion and neck supple.  Skin:    General: Skin is warm and dry.  Neurological:     Mental Status: She is alert and oriented to person, place, and time. Mental status is at baseline.     Cranial Nerves: No cranial nerve deficit.     Sensory: No sensory deficit.     Motor: No weakness.     Coordination: Coordination normal.     ED Results / Procedures / Treatments   Labs (all labs ordered are listed, but only abnormal results are displayed) Labs Reviewed  CBC - Abnormal; Notable for the following components:      Result Value   WBC 11.4 (*)    All other components within normal limits  COMPREHENSIVE METABOLIC PANEL - Abnormal; Notable for the following components:   Creatinine, Ser 1.03 (*)    All other components within normal limits  RAPID URINE DRUG SCREEN, HOSP PERFORMED - Abnormal; Notable for the following components:   Benzodiazepines POSITIVE (*)    Tetrahydrocannabinol POSITIVE (*)    All other components within normal limits   BASIC METABOLIC PANEL - Abnormal; Notable for the following components:   Potassium 3.4 (*)    CO2 21 (*)    All other components within normal limits  BASIC METABOLIC PANEL - Abnormal; Notable for the following components:  CO2 21 (*)    All other components within normal limits  I-STAT CHEM 8, ED - Abnormal; Notable for the following components:   Potassium 3.4 (*)    All other components within normal limits  SARS CORONAVIRUS 2 BY RT PCR (HOSPITAL ORDER, PERFORMED IN [facility])  ETHANOL  PROTIME-INR  APTT  DIFFERENTIAL  URINALYSIS, ROUTINE W REFLEX MICROSCOPIC  VITAMIN B12  CBC  MAGNESIUM  VITAMIN B1  I-STAT BETA HCG BLOOD, ED ([facility], [facility], [facility] ONLY)  CBG MONITORING, ED    EKG EKG Interpretation  Date/Time:  Monday Sep 14 2019 18:43:59 EDT Ventricular Rate:  66 PR Interval:    QRS Duration: 90 QT Interval:  429 QTC Calculation: 450 R Axis:   85 Text Interpretation: Sinus rhythm Consider right atrial enlargement NO STEMI Confirmed by Alvester Chou [phone]) on 09/15/2019 8:30:21 AM   Radiology No results found.  Procedures Procedures (including critical care time)  Medications Ordered in ED Medications  potassium chloride SA (KLOR-CON) CR tablet 40 mEq (40 mEq Oral Given 09/15/19 0930)  ketorolac (TORADOL) 30 MG/ML injection 30 mg (30 mg Intravenous Given 09/16/19 1139)    ED Course  I have reviewed the triage vital signs and the nursing notes.  Pertinent labs & imaging results that were available during my care of the patient were reviewed by me and considered in my medical decision making (see chart for details).    MDM Rules/Calculators/A&P                      Patient arrives to the ER with activated code stroke and neurology team at the bedside.  Patient allegedly had episode of dysarthria and focal weakness followed by seizure-like episodes.  She had witnessed seizure-like episode by both family and then paramedics.  She has received 2 mg of  Versed for it.  On my exam patient is back to baseline normal.  Neurology has cleared her from stroke perspective.  They would like her to be admitted to evaluate further for possible seizure-like activity.  Patient is on multiple medications per family.  That could be underlying cause or contributing cause.  She is stable at this time for admission.  Final Clinical Impression(s) / ED Diagnoses Final diagnoses:  Seizures (HCC)  Slurred speech    Rx / DC Orders ED Discharge Orders         Ordered    sertraline (ZOLOFT) 100 MG tablet  Daily at bedtime     09/16/19 1817    Increase activity slowly     09/16/19 1817    Discharge instructions    Comments: Follow with Primary MD  Mick Sell, MD in 1-2 weeks  Please follow with your primary psychiatrist in the next 1-2 weeks.  Please get a complete blood count and chemistry panel checked by your Primary MD at your next visit, and again as instructed by your Primary MD.  Get Medicines reviewed and adjusted: Please take all your medications with you for your next visit with your Primary MD  Laboratory/radiological data: Please request your Primary MD to go over all hospital tests and procedure/radiological results at the follow up, please ask your Primary MD to get all Hospital records sent to his/her office.  In some cases, they will be blood work, cultures and biopsy results pending at the time of your discharge. Please request that your primary care M.D. follows up on these results.  Also Note the following: If you  experience worsening of your admission symptoms, develop shortness of breath, life threatening emergency, suicidal or homicidal thoughts you must seek medical attention immediately by calling 911 or calling your MD immediately  if symptoms less severe.  You must read complete instructions/literature along with all the possible adverse reactions/side effects for all the Medicines you take and that have been  prescribed to you. Take any new Medicines after you have completely understood and accpet all the possible adverse reactions/side effects.   Do not drive when taking Pain medications or sleeping medications (Benzodaizepines)  Do not take more than prescribed Pain, Sleep and Anxiety Medications. It is not advisable to combine anxiety,sleep and pain medications without talking with your primary care practitioner  Special Instructions: If you have smoked or chewed Tobacco  in the last 2 yrs please stop smoking, stop any regular Alcohol  and or any Recreational drug use.  Wear Seat belts while driving.  Please note: You were cared for by a hospitalist during your hospital stay. Once you are discharged, your primary care physician will handle any further medical issues. Please note that NO REFILLS for any discharge medications will be authorized once you are discharged, as it is imperative that you return to your primary care physician (or establish a relationship with a primary care physician if you do not have one) for your post hospital discharge needs so that they can reassess your need for medications and monitor your lab values.   09/16/19 1817    Diet general     09/16/19 1817    Call MD for:  extreme fatigue     09/16/19 1817    Call MD for:  persistant dizziness or light-headedness     09/16/19 1817           Derwood Kaplan, MD 09/21/19 1710

## 2019-09-22 LAB — VITAMIN B1: Vitamin B1 (Thiamine): 121.1 nmol/L (ref 66.5–200.0)

## 2019-09-22 NOTE — Telephone Encounter (Signed)
Note pt was offered sooner appt by Kathie Rhodes. Pt will come this Thurs 6/10 @ 130 pm.

## 2019-09-24 ENCOUNTER — Encounter: Payer: Self-pay | Admitting: Neurology

## 2019-09-24 ENCOUNTER — Ambulatory Visit (INDEPENDENT_AMBULATORY_CARE_PROVIDER_SITE_OTHER): Payer: PRIVATE HEALTH INSURANCE | Admitting: Neurology

## 2019-09-24 VITALS — BP 104/69 | HR 74 | Ht 67.0 in | Wt 136.0 lb

## 2019-09-24 DIAGNOSIS — F445 Conversion disorder with seizures or convulsions: Secondary | ICD-10-CM | POA: Diagnosis not present

## 2019-09-24 DIAGNOSIS — E538 Deficiency of other specified B group vitamins: Secondary | ICD-10-CM

## 2019-09-24 DIAGNOSIS — R569 Unspecified convulsions: Secondary | ICD-10-CM

## 2019-09-24 NOTE — Patient Instructions (Addendum)
Non-Epileptic Seizures, Adult A seizure can cause:  Involuntary movements, like falling or shaking.  Changes in awareness or consciousness.  Convulsions. These are episodes of uncontrollable, jerking movement caused by sudden, intense tightening (contraction) of the muscles. Epileptic seizures are caused by abnormal electrical activity in the brain. Non-epileptic seizures are different. They are not caused by abnormal electrical signals in your brain. These seizures may look like epileptic seizures, but they are not caused by epilepsy. There are two types of non-epileptic seizures:  Physiologic non-epileptic seizure. This type results from an underlying problem that causes a disruption in your brain's electrical activity.  Psychogenic non-epileptic seizure. This type results from emotional stress. These seizures are sometimes called pseudoseizures. What are the causes? Causes of physiologic non-epileptic seizures can include:  Sudden drop in blood pressure.  Low blood sugar (glucose).  Low levels of salt (sodium) in your blood.  Low levels of calcium in your blood.  Heart rhythm problems.  Sleep disorders, such as narcolepsy.  Movement disorders, such as Tourette syndrome.  Infection.  Certain medicines.  Drug and alcohol abuse.  Fever. Common causes of psychogenic non-epileptic seizures can include:  Stress.  Emotional trauma.  Sexual or physical abuse.  Major life events, such as divorce or death of a loved one.  Mental health disorders, including anxiety and depression. What are the signs or symptoms? Symptoms of a non-epileptic seizure can be similar to those of an epileptic seizure, which may include:  A change in attention or behavior (altered mental status).  Loss of consciousness or fainting.  Convulsions with rhythmic jerking movements.  Drooling.  Rapid eye movements.  Grunting.  Loss of bladder control and bowel control.  Bitter taste in  the mouth.  Tongue biting. Some people experience unusual sensations (aura) before having a seizure. These can include:  "Butterflies" in the stomach.  Abnormal smells or tastes.  A feeling of having had a new experience before (dj vu). After a non-epileptic seizure, you may have a headache or sore muscles or feel confused and sleepy. Non-epileptic seizures usually:  Do not cause physical injuries.  Start slowly.  Include crying or shrieking.  Last longer than 2 minutes.  Include pelvic thrusting. How is this diagnosed? Non-epileptic seizures may be diagnosed by:  Your medical history.  A physical exam.  Your symptoms. ? Your health care provider may want to talk with your friends or relatives who have seen you have a seizure. ? If possible, it is helpful if you write down your seizure activity, including what led up to the seizure, and share that information with your health care provider. You may also need to have tests to look for causes of physiologic non-epileptic seizures. These may include:  An electroencephalogram (EEG). This test measures electrical activity in your brain. If you have had a non-epileptic seizure, the results of your EEG will likely be normal.  Video EEG. This test takes place in the hospital over the course of 2-7 days. The test uses a video camera and an EEG to monitor your symptoms and the electrical activity in your brain.  Blood tests.  Lumbar puncture. This test involves pulling fluid from your spine to check for infection.  Electrocardiogram (ECG or EKG). This test checks for an abnormal heart rhythm.  CT scan. If your health care provider thinks you have had a psychogenic non-epileptic seizure, you may need to see a mental health specialist for an evaluation. How is this treated? The treatment for your seizures will  depend on what is causing them. When the underlying condition is treated, your seizures should stop. If your seizures are  being caused by emotional trauma or stress, your health care provider may recommend that you see a mental health professional. Treatment may include:  Relaxation therapy or cognitive behavioral therapy.  Medicines to treat depression or anxiety.  Individual or family counseling. In some cases, you may have psychogenic seizures in addition to epileptic seizures. If this is the case, you may be prescribed medicine to help with the epileptic seizures. Follow these instructions at home: Home care will depend on the type of non-epileptic seizures that you have. In general:  Follow all instructions from your health care provider. These may include ways to prevent seizures and what to do if you have a seizure.  Take over-the-counter and prescription medicines only as told by your health care provider.  Keep all follow-up visits as told by your health care provider. This is important.  Make sure family members, friends, and coworkers are trained on how to help you if you have a seizure. If you have a seizure, they should: ? Lay you on the ground to prevent a fall. ? Place a pillow or piece of clothing under your head. ? Loosen any clothing around your neck. ? Turn you onto your side. If vomiting occurs, this helps keep your airway clear.  Avoid any substances that may prevent your medicine from working properly. If you are prescribed medicine for seizures: ? Do not use recreational drugs. ? Limit or avoid alcoholic beverages. Contact a health care provider if:  Your seizures change or become more frequent.  You continue to have seizures after treatment. Get help right away if:  You injure yourself during a seizure.  You have one seizure after another.  You have trouble recovering from a seizure.  You have chest pain or trouble breathing.  You have a seizure that lasts longer than 5 minutes. Summary  Non-epileptic seizures may look like epileptic seizures, but they are not caused  by epilepsy.  The treatment for your seizures will depend on what is causing them. When the underlying condition is treated, your seizures should stop.  Make sure family members, friends, and coworkers are trained on how to help you if you have a seizure. If you have a seizure, they should lay you on the ground to prevent a fall, protect your head and neck, and turn you onto your side. This information is not intended to replace advice given to you by your health care provider. Make sure you discuss any questions you have with your health care provider. Document Revised: 03/15/2017 Document Reviewed: 01/13/2016 Elsevier Patient Education  2020 [employer].

## 2019-09-24 NOTE — Progress Notes (Signed)
[facility]    Provider:  Dr Lucia GaskinsAhern Referring Provider: Mick SellFitzgerald, David P, MD Primary Care Physician:  Mick SellFitzgerald, David P, MD ,  CC:  Migraine and non-epileptiic-seizure(seizure-like activity)  Interval history September 24, 2019: Patient is here for a new issue/request: seizure-like activity, she was [facility] and discharged on June 2 of this year, I reviewed notes from her admission: She presented with a 1 month history of intermittent headaches, seizure-like spells, subtle right sided weakness and subsequently admitted to the hospitalist service for further evaluation, right facial droop, EEG showed no seizure activity, MRI of the brain and MRA of the head without any acute etiologies, UDS positive for benzos and THC.  Diagnosed with pseudoseizures, video EEG was performed to events were recorded without concomitant EEG changes, no seizure or epileptiform discharges were seen throughout the recording.  No obvious headaches.  Psychiatry recommended discontinuation of Neurontin by Dr. Shelbie HutchingLindzen's opinion was that patient was on such a low dose it was okay to discontinue.  Zoloft was increased for her anxiety.  She was very tearful and stressful and upset at the pseudoseizure diagnosis and wanting to know why she still had right-sided numbness weakness.  She is here with her husband. She was unhappy about her admission and her psychiatric referral, she is having "episodes" and feels like she was labeled as psychiatric. She was having 7 episodes every other hour 7-10 minutes long, husband provides much information, has been ongoing for months but sporadic and she went to the hospital because they got worse over memorial day weekend, she started shaking. She knows when it is coming, she is about to have one, the cheek will start twiching and she would lay down and both arms shaking, when she calmed down she feels better and wake up and be exhausted then  complain about a headaches. She has a video, her head ir extended, she is hyperventilating, arms shaking, lasts 10 minutes. EEG caught several of the events, no correlate. She is better on the Klonopin.   Reviewed MRI of the brain and MRA of the head images agree with radiology reports below:  MRI HEAD WITHOUT CONTRAST  MRA HEAD WITHOUT CONTRAST  TECHNIQUE: Multiplanar, multiecho pulse sequences of the brain and surrounding structures were obtained without intravenous contrast. Angiographic images of the head were obtained using MRA technique without contrast.  COMPARISON:  CT head 09/14/2019  FINDINGS: MRI HEAD FINDINGS  Brain: No acute infarction, hemorrhage, hydrocephalus, extra-axial collection or mass lesion. No significant chronic ischemic change.  Vascular: Normal arterial flow voids  Skull and upper cervical spine: No focal skeletal lesion.  Sinuses/Orbits: Mild mucosal edema paranasal sinuses. Normal orbit bilaterally  Other: None  MRA HEAD FINDINGS  Internal carotid artery widely patent bilaterally. Anterior and middle cerebral arteries are normal and widely patent bilaterally  Both vertebral arteries are patent without stenosis. PICA is patent bilaterally. AICA, superior cerebellar, posterior cerebral arteries widely patent without stenosis. Basilar widely patent.  Negative for cerebral aneurysm.  IMPRESSION: Negative MRI head  Negative MRA head  Bmp, magnesium, cbc, unremarkable  B12 291, B1 normal, UDS +benzo and THC  Patient complains of symptoms per HPI as well as the following symptoms: seizure.stress,worrying . Pertinent negatives and positives per HPI. All others negative   Interval history 02/05/2019: Doing excellent on botox. - Doing excellent on Botox alone. NOT ON CGRP. Continue botox, > 75% improvement in frequency and severity of migraines.  Interval History 08/21/2018: Patient  reports that the cgrp has not worked. Sine she  stopped the botox and remained on the Aimovig The migraines have returned since she stopped the botox. At this time she is not takng the Aimovig and would like to go back to the botox. DIscussed this, will try and get both botox and cgrp approved but if we cannot then she will stop the cgrp in favor of botox   HPI 12/30/2017:  Nancy Porter is a 49 y.o. female here as requested by Dr. Sampson Goon for migraines. PMHx anxiety, suicide attempt, tobacco use, ADHD. She has had a continuousheadache since July 27th. She has Daily headaches and >15 migraine days a month for over a year but worsening to daily migraines since July 27th. The headache is unilateral, but can be bilateral temporal and periorbital, throbbing/pulsating/pounding, +photo/phonophobia, +nasuea, +vomiting, movement makes it worse, no aura, she takes abortive medication daily, she has no medication overuse, unknown inciting events, she has been to other neurologists, she has been to [facility], she has been to the emergency room and has been admitted. Severe pain. Migraine lasts 24-72 hours. She denies any OTC medication use since July. She even stopped the Benadryl. She describes lots of pressure. No aura. No medication overuse, no aura. She has been to the eye doctor, she has had imaging that is normal. The episodes are severe. She has associated tingling and some dizziness. She is worried and anxious. Husband says she is under stress, worried, working late, constantly going without a break. She ha numbness on the right nd now she is having them on the left. She will zone out blank stare.   meds tried: butalbital, imitrex, advil, tylenol, excedrin, topamax, benadryl, ativan, DHE, compazine, toradol, zomig, nortriptyline, prednisone, nortriptyline, trazodone  Reviewed notes, labs and imaging from outside physicians, which showed:  Personally reviewed images on CD patient brought MRI brain and MRA head appears normal  Reviewed notes from headache  clinic [facility] 12/2017. Migraine for 19 days, intense pressure, light sensitivity, has been to the ED, has seen neurosurgery, taking excedrin and imitrex, daily headache in the forehead and temporal regions, wors ein the afternoons, headaches for 20 years. Neuro exam was normal. Medication overuse was disussed. Also seen prior 8/16 for 3 weeks of throbbing headaches. She is "going insane", headaches 3-4 days a week.   11/20/2017: BUn 11, creatinine 0.59  Review of Systems: Patient complains of symptoms per HPI as well as the following symptoms: headache, numbness, dizziness, anxiety. Pertinent negatives and positives per HPI. All others negative.   Social History   Socioeconomic History  . Marital status: Married    Spouse name: Renae Fickle  . Number of children: 2  . Years of education: some college  . Highest education level: Some college, no degree  Occupational History  . Occupation: Scientist, physiological: [employer]  Tobacco Use  . Smoking status: Current Every Day Smoker    Packs/day: 1.00    Years: 35.00    Pack years: 35.00    Types: Cigarettes    Last attempt to quit: 01/08/2019    Years since quitting: 0.7  . Smokeless tobacco: Never Used  . Tobacco comment: quit but recently started back   Vaping Use  . Vaping Use: Former  Substance and Sexual Activity  . Alcohol use: Yes    Comment: occ maybe 1-2 a month  . Drug use: Yes    Frequency: 7.0 times per week    Types:  Marijuana    Comment: cocaine in the past  . Sexual activity: Not on file  Other Topics Concern  . Not on file  Social History Narrative   Patient is left-handed. She lives with her husband and daughter in a 1 story house.  She has not been exercising in the last year.    3 cups coffee daily.   Social Determinants of Health   Financial Resource Strain:   . Difficulty of Paying Living Expenses:   Food Insecurity:   . Worried About Programme researcher, broadcasting/film/video in the Last Year:   .  Barista in the Last Year:   Transportation Needs:   . Freight forwarder (Medical):   Marland Kitchen Lack of Transportation (Non-Medical):   Physical Activity:   . Days of Exercise per Week:   . Minutes of Exercise per Session:   Stress:   . Feeling of Stress :   Social Connections:   . Frequency of Communication with Friends and Family:   . Frequency of Social Gatherings with Friends and Family:   . Attends Religious Services:   . Active Member of Clubs or Organizations:   . Attends Banker Meetings:   Marland Kitchen Marital Status:   Intimate Partner Violence:   . Fear of Current or Ex-Partner:   . Emotionally Abused:   Marland Kitchen Physically Abused:   . Sexually Abused:     Family History  Problem Relation Age of Onset  . Glaucoma Mother   . Heart disease Mother   . Heart Problems Mother   . Arrhythmia Mother        fib  . Deep vein thrombosis Father   . Heart Problems Father   . Heart disease Father   . Migraines Sister   . Stroke Maternal Grandmother   . Aneurysm Maternal Grandmother   . Heart disease Maternal Grandmother   . Diabetes Paternal Uncle        adult onset  . CAD Maternal Grandfather   . Seizures Neg Hx     Past Medical History:  Diagnosis Date  . Anxiety   . Arrhythmia   . COPD (chronic obstructive pulmonary disease) (HCC)   . Depression   . Deviated septum   . Hx of migraines   . Hx of suicide attempt 2010   overdose on Trileptal, has been on Seroquel in the past  . Hyperlipidemia   . Tobacco use     Past Surgical History:  Procedure Laterality Date  . ABDOMINAL HYSTERECTOMY    . AUGMENTATION MAMMAPLASTY Bilateral    implants 30 years ago-saline  . BREAST BIOPSY Right 08/19/2019   Affirm bx-"coil" clip-path pending  . BREAST ENHANCEMENT SURGERY    . CESAREAN SECTION     x 2  . SHOULDER SURGERY Left    impingement  . TRANSUMBILICAL AUGMENTATION MAMMAPLASTY      Current Outpatient Medications  Medication Sig Dispense Refill  . albuterol  (VENTOLIN HFA) 108 (90 Base) MCG/ACT inhaler Inhale 2 puffs into the lungs every 6 (six) hours as needed for wheezing or shortness of breath. 6.7 g 11  . clonazePAM (KLONOPIN) 0.5 MG tablet Take 0.5 mg by mouth 2 (two) times daily.    . fluticasone (FLONASE) 50 MCG/ACT nasal spray SPRAY 2 SPRAYS INTO EACH NOSTRIL EVERY DAY 16 mL 1  . Fluticasone-Salmeterol (WIXELA INHUB) 500-50 MCG/DOSE AEPB Inhale 1 puff into the lungs in the morning and at bedtime.     Marland Kitchen guanFACINE (INTUNIV) 1  MG TB24 ER tablet Take 1 mg by mouth daily.    . montelukast (SINGULAIR) 10 MG tablet Take 1 tablet (10 mg total) by mouth at bedtime. 30 tablet 11  . nortriptyline (PAMELOR) 25 MG capsule TAKE 1 CAPSULE (25 MG TOTAL) BY MOUTH AT BEDTIME. 90 capsule 3  . sertraline (ZOLOFT) 100 MG tablet Take 2 tablets (200 mg total) by mouth at bedtime. 60 tablet 0  . tolterodine (DETROL) 1 MG tablet Take 1 mg by mouth 2 (two) times daily.    Marland Kitchen topiramate (TOPAMAX) 100 MG tablet TAKE 1 TABLET BY MOUTH TWICE A DAY (Patient taking differently: Take 100 mg by mouth 2 (two) times daily. ) 180 tablet 0   No current facility-administered medications for this visit.    Allergies as of 09/24/2019  . (No Known Allergies)    Vitals: BP 104/69 (BP Location: Right Arm, Patient Position: Sitting)   Pulse 74   Ht 5\' 7"  (1.702 m)   Wt 136 lb (61.7 kg)   LMP  (LMP Unknown)   BMI 21.30 kg/m  Last Weight:  Wt Readings from Last 1 Encounters:  09/24/19 136 lb (61.7 kg)   Last Height:   Ht Readings from Last 1 Encounters:  09/24/19 5\' 7"  (1.702 m)    Physical exam: Exam: Gen: NAD, conversant, well nourised, thin, well groomed                     CV: RRR, no MRG. No Carotid Bruits. No peripheral edema, warm, nontender Eyes: Conjunctivae clear without exudates or hemorrhage  Neuro: Detailed Neurologic Exam  Speech:    Speech is normal; fluent and spontaneous with normal comprehension.  Cognition:    The patient is oriented to  person, place, and time;     recent and remote memory intact;     language fluent;     normal attention, concentration,     fund of knowledge Cranial Nerves:    The pupils are equal, round, and reactive to light. The fundi are normal and spontaneous venous pulsations are present. Visual fields are full to finger confrontation. Extraocular movements are intact. Trigeminal sensation is intact and the muscles of mastication are normal. The face is symmetric. The palate elevates in the midline. Hearing intact. Voice is normal. Shoulder shrug is normal. The tongue has normal motion without fasciculations.   Coordination:    Normal finger to nose and heel to shin. Normal rapid alternating movements.   Gait:    Heel-toe and tandem gait are normal.   Motor Observation:    No asymmetry, no atrophy, and no involuntary movements noted. Tone:    Normal muscle tone.    Posture:    Posture is normal. normal erect    Strength:    Strength is V/V in the upper and lower limbs.      Sensation: intact to LT     Reflex Exam:  DTR's:    Deep tendon reflexes in the upper and lower extremities are normal bilaterally.   Toes:    The toes are downgoing bilaterally.   Clonus:    Clonus is absent.       Assessment/Plan:  49 year old with chronic intractable headaches.  She also had a recent hospitalization and diagnosed with psychogenic pseudoseizures and she is here to discuss this new diagnosis.  I had a very long talk with patient and husband.  Initially they were quite upset because they were told "this is all in  your head" per report, however I did talk to them about conversion disorder, that emotional problems can definitely present themselves physically, that it is not "all in her head" as she did have these episodes; the episodes are real, the cause is not from an abnormal electrical activity in the brain but I do think it is from stress, anxiety, worrying, she does see a psychiatrist, I  recommended seeing a therapist.  I also gave her a book about nonepileptic and psychogenic pseudoseizures, I think that she understood and hopefully she will read the book and tell me what she thinks.  I did tell her however that we could do further testing, but she has not had any episodes since being on the Klonopin, I did review a video that she had which was not consistent with seizures (head extension, hyperventilating, shaking of limbs variably), not consistent with physiologic neurologic etiology.  I tried to reassure her that there are many people who have nonepileptic, psychogenic. Will check MMA since B12 was 291.   No orders of the defined types were placed in this encounter.  Orders Placed This Encounter  Procedures  . B12 and Folate Panel  . Methylmalonic acid, serum      PRIOR:  Doing excellent on botox. - Doing excellent on Botox alone. NOT ON CGRP. Continue botox, > 75% improvement in frequency and severity of migraines.  I will Not charge her, this could have been a nurse call she didn't need an appointment to ask if we can continue Botox.   To prevent or relieve headaches, try the following: Cool Compress. Lie down and place a cool compress on your head.  Avoid headache triggers. If certain foods or odors seem to have triggered your migraines in the past, avoid them. A headache diary might help you identify triggers.  Include physical activity in your daily routine. Try a daily walk or other moderate aerobic exercise.  Manage stress. Find healthy ways to cope with the stressors, such as delegating tasks on your to-do list.  Practice relaxation techniques. Try deep breathing, yoga, massage and visualization.  Eat regularly. Eating regularly scheduled meals and maintaining a healthy diet might help prevent headaches. Also, drink plenty of fluids.  Follow a regular sleep schedule. Sleep deprivation might contribute to headaches Consider biofeedback. With this mind-body  technique, you learn to control certain bodily functions -- such as muscle tension, heart rate and blood pressure -- to prevent headaches or reduce headache pain.    Proceed to emergency room if you experience new or worsening symptoms or symptoms do not resolve, if you have new neurologic symptoms or if headache is severe, or for any concerning symptom.   Provided education and documentation from American headache Society toolbox including articles on: chronic migraine medication overuse headache, chronic migraines, prevention of migraines, behavioral and other nonpharmacologic treatments for headache.    Cc: Leonel Ramsay, MD  Sarina Ill, MD  [facility] [address] [place], [place] [address]  Phone [phone] Fax [phone]

## 2019-09-28 ENCOUNTER — Encounter: Payer: Self-pay | Admitting: Neurology

## 2019-09-28 DIAGNOSIS — R569 Unspecified convulsions: Secondary | ICD-10-CM | POA: Insufficient documentation

## 2019-10-09 ENCOUNTER — Encounter: Payer: Self-pay | Admitting: Psychology

## 2019-10-10 ENCOUNTER — Other Ambulatory Visit: Payer: Self-pay | Admitting: Critical Care Medicine

## 2019-10-10 DIAGNOSIS — R053 Chronic cough: Secondary | ICD-10-CM

## 2019-10-23 ENCOUNTER — Other Ambulatory Visit: Payer: Self-pay | Admitting: Neurology

## 2019-10-27 ENCOUNTER — Encounter: Payer: 59 | Admitting: Psychology

## 2019-11-04 ENCOUNTER — Other Ambulatory Visit (HOSPITAL_COMMUNITY): Payer: Self-pay | Admitting: Gastroenterology

## 2019-11-04 ENCOUNTER — Ambulatory Visit: Payer: PRIVATE HEALTH INSURANCE | Admitting: Neurology

## 2019-11-04 ENCOUNTER — Other Ambulatory Visit: Payer: Self-pay | Admitting: Gastroenterology

## 2019-11-04 DIAGNOSIS — R634 Abnormal weight loss: Secondary | ICD-10-CM

## 2019-11-04 DIAGNOSIS — R0989 Other specified symptoms and signs involving the circulatory and respiratory systems: Secondary | ICD-10-CM

## 2019-11-04 DIAGNOSIS — R630 Anorexia: Secondary | ICD-10-CM

## 2019-11-04 DIAGNOSIS — R1313 Dysphagia, pharyngeal phase: Secondary | ICD-10-CM

## 2019-11-12 ENCOUNTER — Other Ambulatory Visit: Payer: Self-pay | Admitting: Critical Care Medicine

## 2019-11-13 ENCOUNTER — Ambulatory Visit
Admission: RE | Admit: 2019-11-13 | Discharge: 2019-11-13 | Disposition: A | Discharge status: Home / Self Care | Payer: 59 | Source: Ambulatory Visit | Attending: Gastroenterology | Admitting: Gastroenterology

## 2019-11-13 ENCOUNTER — Other Ambulatory Visit: Payer: Self-pay

## 2019-11-13 ENCOUNTER — Other Ambulatory Visit: Payer: Self-pay | Admitting: Gastroenterology

## 2019-11-13 DIAGNOSIS — R0989 Other specified symptoms and signs involving the circulatory and respiratory systems: Secondary | ICD-10-CM | POA: Diagnosis present

## 2019-11-13 DIAGNOSIS — R630 Anorexia: Secondary | ICD-10-CM

## 2019-11-13 DIAGNOSIS — R634 Abnormal weight loss: Secondary | ICD-10-CM

## 2019-11-13 DIAGNOSIS — R1313 Dysphagia, pharyngeal phase: Secondary | ICD-10-CM | POA: Diagnosis not present

## 2019-11-13 DIAGNOSIS — R09A2 Foreign body sensation, throat: Secondary | ICD-10-CM

## 2019-11-16 ENCOUNTER — Other Ambulatory Visit: Payer: Self-pay | Admitting: Critical Care Medicine

## 2019-11-16 DIAGNOSIS — R053 Chronic cough: Secondary | ICD-10-CM

## 2019-11-16 DIAGNOSIS — R05 Cough: Secondary | ICD-10-CM

## 2019-12-15 ENCOUNTER — Other Ambulatory Visit: Payer: Self-pay | Admitting: Critical Care Medicine

## 2019-12-15 DIAGNOSIS — R053 Chronic cough: Secondary | ICD-10-CM

## 2019-12-20 ENCOUNTER — Other Ambulatory Visit: Payer: Self-pay | Admitting: Critical Care Medicine

## 2019-12-29 ENCOUNTER — Encounter: Payer: 59 | Admitting: Psychology

## 2019-12-29 ENCOUNTER — Other Ambulatory Visit: Payer: Self-pay | Admitting: Critical Care Medicine

## 2019-12-29 DIAGNOSIS — R053 Chronic cough: Secondary | ICD-10-CM

## 2020-01-15 ENCOUNTER — Encounter: Payer: Self-pay | Admitting: Psychology

## 2020-01-15 ENCOUNTER — Other Ambulatory Visit: Payer: Self-pay

## 2020-01-15 ENCOUNTER — Encounter: Payer: Self-pay | Attending: Psychology | Admitting: Psychology

## 2020-01-15 DIAGNOSIS — F17200 Nicotine dependence, unspecified, uncomplicated: Secondary | ICD-10-CM

## 2020-01-15 DIAGNOSIS — F445 Conversion disorder with seizures or convulsions: Secondary | ICD-10-CM

## 2020-01-15 DIAGNOSIS — F1220 Cannabis dependence, uncomplicated: Secondary | ICD-10-CM

## 2020-01-15 NOTE — Progress Notes (Addendum)
HEALTH BEHAVIOR ASSESSMENT   Name: Nancy DinLisa D Kaine Date of Birth: 06-02-1970 Date of Interview: 01/17/2020  Reason for Referral:  [name] is a 49 year old Caucasian, married, female with 13 years of education. She was referred by Clydie Braunavid Fitzgerald, MD (PCP) at [facility] due to  recurring episodes of non-epileptic seizures that began around Sep 13, 2019. She has a complex medical and psychiatric history that includes previous suicide attempt in 2021.   History of Presenting Problem: Nancy Porter is a 49 y.o. female here as requested by Dr. 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 She as a longstanding history remarkable for anxiety, suicide attempt, tobacco use, ADHD. She has had a continuousheadache since July 27th. She has Daily headaches and >15 migraine days a month for over a year but worsening to daily migraines since July 27th. The headache is unilateral, but can be bilateral temporal and periorbital, throbbing/pulsating/pounding, +photo/phonophobia, +nasuea, +vomiting, movement makes it worse, no aura, she takes abortive medication daily, she has no medication overuse, unknown inciting events, she has been to other neurologists, she has been to [facility], she has been to the emergency room and has been admitted. Severe pain. Migraine lasts 24-72 hours. She denies any OTC medication use since July. She even stopped the Benadryl. She describes lots of pressure. No aura. No medication overuse, no aura. She has been to the eye doctor, she has had imaging that is normal. The episodes are severe. She has associated tingling and some dizziness. She is worried and anxious. Husband says she is under stress, worried, working late, constantly going without a break. She ha numbness on the right nd now she is having them on the left. She will zone out blank stare. Meds tried: butalbital, imitrex, advil, tylenol, excedrin, topamax, benadryl, ativan, DHE, compazine, toradol, zomig,  nortriptyline, prednisone, nortriptyline, trazodone  Clinical Interview: During the clinical interview, she described primary difficulties with having "seizures when I feel overwhelmed or stressed about something". She reported that " anxiety runs my life and I have become a very different person as I barely leave my home". She stated that her mood symptoms negatively impact her ability to stay focused on tasks during the day and results in feeling overwhelmed much of the time. Problem solving and multitasking abilities are reduced. She described herself as significantly more withdrawn in the last 2 years and that she does not enjoy previously enjoyed activities. She is more easily irritable and agitated. She described having reduced interest in sex that has negatively impacted her current marriage.   With regard to daily life activities, she finds routine responsibilities difficult to keep up with due to depression and low motivation. Even basic self-care is neglected such as going without bathing or grooming her body for several days in a row. She has stopped driving due having seizure-like episodes.      According to medical records, the patient was seen by Dr. Lucia GaskinsAhern of [facility] on 09/24/19. This provider's note indicated that "she was [facility] May [facility] and discharged on June 2 of this year, I reviewed notes from her admission: She presented with a 1 month history of intermittent headaches, seizure-like spells, subtle right sided weakness and subsequently admitted to the hospitalist service for further evaluation, right facial droop, EEG showed no seizure activity, MRI of the brain and MRA of the head without any acute etiologies, UDS positive for benzos and THC.  Diagnosed with pseudoseizures, video EEG was  performed to events were recorded without concomitant EEG changes, no seizure or epileptiform discharges were seen throughout the recording.  No obvious  headaches.  Psychiatry recommended discontinuation of Neurontin by Dr. Shelbie Hutching opinion was that patient was on such a low dose it was okay to discontinue.  Zoloft was increased for her anxiety.  She was very tearful and stressful and upset at the pseudoseizure diagnosis and wanting to know why she still had right-sided numbness weakness.  She is here with her husband. She was unhappy about her admission and her psychiatric referral, she is having "episodes" and feels like she was labeled as psychiatric. She was having 7 episodes every other hour 7-10 minutes long, husband provides much information, has been ongoing for months but sporadic and she went to the hospital because they got worse over memorial day weekend, she started shaking. She knows when it is coming, she is about to have one, the cheek will start twiching and she would lay down and both arms shaking, when she calmed down she feels better and wake up and be exhausted then complain about a headaches. She has a video, her head ir extended, she is hyperventilating, arms shaking, lasts 10 minutes. EEG caught several of the events, no correlate. She is better on the Klonopin."  Reviewed MRI of the brain and MRA of the head images agree with radiology reports below:  TECHNIQUE: Multiplanar, multiecho pulse sequences of the brain and surrounding structures were obtained without intravenous contrast. Angiographic images of the head were obtained using MRA technique without contrast.  COMPARISON: CT head 09/14/2019  FINDINGS: MRI HEAD FINDINGS  Brain: No acute infarction, hemorrhage, hydrocephalus, extra-axial collection or mass lesion. No significant chronic ischemic change.  Vascular: Normal arterial flow voids. Skull and upper cervical spine: No focal skeletal lesion. Sinuses/Orbits: Mild mucosal edema paranasal sinuses. Normal orbit bilaterally  MRA HEAD FINDINGS  Internal carotid artery widely patent bilaterally.  Anterior and middle cerebral arteries are normal and widely patent bilaterally Both vertebral arteries are patent without stenosis. PICA is patent bilaterally. AICA, superior cerebellar, posterior cerebral arteries widely patent without stenosis. Basilar widely patent. Negative for cerebral aneurysm.  IMPRESSION: Negative MRI head. Negative MRA head  Bmp, magnesium, cbc, unremarkable. B12 291, B1 normal, UDS +benzo and THC  Reviewed notes from headache clinic [facility] 12/2017. Migraine for 19 days, intense pressure, light sensitivity, has been to the ED, has seen neurosurgery, taking excedrin and imitrex, daily headache in the forehead and temporal regions, wors ein the afternoons, headaches for 20 years. Neuro exam was normal. Medication overuse was disussed. Also seen prior 8/16 for 3 weeks of throbbing headaches. She is "going insane", headaches 3-4 days a week.   Social History: Born/Raised: [place], [place]  Current Employment: Unemployed since 04/2019; She does not belie ve that she can return to previous type of work or perform any other job due to her medical and psychological health, which significantly impacts different functional domains (occupational, social, family, marriage/intimacy).   Past Employment: IT consultant for many years.   Marital Status/Living:  Married. Divorced twice in the past. She currently lives with her husband and 63 year old daughter from previous marriage.   Children: 2 (ages 38 and 34)   Education:  Graduated high school with some difficulty due to substance use/abuse. There are no indications of learning or intellectual disability, or ADHD.    Psychiatric History: Substance Abuse/Dependence: Longstanding substance abuse (alcohol, THC, cocaine) beginning in high school with inpatient rehab around  4 weeks at age 48.  She reportedly attended a 12-step program on her own at age 37 and was able to stay substance free for 5 years. She currently smokes  marijuana and cigarettes on daily basis but denied drinking alcohol or using other illicit substances.    Current Functioning: Work: Unemployed  Complex ADLs Driving: Recently stopped driving due to seizure like activity Medication management: Independent  Management of finances: Independent  Appointments: Independent  Cooking: Requires assistance due to low motivation and mood symptoms.    Medical/Physical complaints:  Any hx of stroke/TIA, MI, LOC/TBI, Sz? Non-epileptic seizures Hx falls? Denied  Balance, probs walking? Denied Sleep: Insomnia? OSA? CPAP? REM sleep beh sx? Trouble falling and staying asleep. Visual illusions/hallucinations? Denied Appetite/Nutrition/Weight changes: Decreased appetite and weight loss   Risk of Suicide/Violence: Low-medium; She had a previous attempt in 2010 but denied active intent or plan to harm herself or other individual(s).  Medical History: Past Medical History:  Diagnosis Date  . Anxiety   . Arrhythmia   . COPD (chronic obstructive pulmonary disease) (HCC)   . Depression   . Deviated septum   . Hx of migraines   . Hx of suicide attempt 2010   overdose on Trileptal, has been on Seroquel in the past  . Hyperlipidemia   . Tobacco use    Family Med/Psych History:  Family History  Problem Relation Age of Onset  . Glaucoma Mother   . Heart disease Mother   . Heart Problems Mother   . Arrhythmia Mother        fib  . Deep vein thrombosis Father   . Heart Problems Father   . Heart disease Father   . Migraines Sister   . Stroke Maternal Grandmother   . Aneurysm Maternal Grandmother   . Heart disease Maternal Grandmother   . Diabetes Paternal Uncle        adult onset  . CAD Maternal Grandfather   . Seizures Neg Hx    Current Medications:  Outpatient Encounter Medications as of 01/15/2020  Medication Sig  . albuterol (VENTOLIN HFA) 108 (90 Base) MCG/ACT inhaler Inhale 2 puffs into the lungs every 6 (six) hours as needed for  wheezing or shortness of breath.  . fluticasone (FLONASE) 50 MCG/ACT nasal spray SPRAY 2 SPRAYS INTO EACH NOSTRIL EVERY DAY  . guanFACINE (INTUNIV) 1 MG TB24 ER tablet Take 1 mg by mouth daily.  . montelukast (SINGULAIR) 10 MG tablet Take 1 tablet (10 mg total) by mouth at bedtime.  . nortriptyline (PAMELOR) 25 MG capsule TAKE 1 CAPSULE (25 MG TOTAL) BY MOUTH AT BEDTIME.  Marland Kitchen sertraline (ZOLOFT) 100 MG tablet Take 2 tablets (200 mg total) by mouth at bedtime.  . tolterodine (DETROL) 1 MG tablet Take 1 mg by mouth 2 (two) times daily.  Marland Kitchen topiramate (TOPAMAX) 100 MG tablet TAKE 1 TABLET BY MOUTH TWICE A DAY  . WIXELA INHUB 500-50 MCG/DOSE AEPB INHALE 1 PUFF BY MOUTH TWICE A DAY   No facility-administered encounter medications on file as of 01/15/2020.   MENTAL STATUS:  The patient was appropriately dressed for season and situation and demonstrated adequate personal hygiene. She was alert and oriented to person, place, time and situation. Stature and height were unremarkable. She maintained good eye contact, related in a cooperative and appropriate manner, and appeared well-nourished and slightly older than chronological age. Mood appeared depressed and agitated. Posture was tense. Affect was flat.  Speech was fluent and without issue. She admitted to experiencing  passive suicidal ideation on intermittent basis but credibly denied plan or intent. Thought processes were at times circumstantial and excessively detailed, though were generally logical and coherent. Thought content was unremarkable.  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 Her previous marriage was reportedly toxic and exacerbated preexisting emotional and behavioral challenges. She was hospitalized for a suicide attempt (e.g., overdose on prescription antidepressant medication) in 2010 that was purportedly related to problems with her ex-husband; she was diagnosed as bipolar. She remarried in 2015 and described new husband as very supportive. She began having migraines in 2020 that seemed related to high levels of work-related stress; she felt overwhelmed at work. 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 She had her first non-epileptic seizure-like episode on Sep 13, 2019 and continued to have them weekly with variable frequency. Her last episode was around first week of September, 2021.   Per the patient, her husband and medical records reviewed, there has been a change in cognitive, emotional, and behavioral functioning and a reasonable suspicion of conversion disorder with seizures or convulsions. There is medical necessity to proceed with psychological intervention to treat conversion disorder and reduce frequency of non-epileptic seizures.    Plan:  Plan is to explain the etiology and treatment approach during next appointment. CBT based Heath behavioral interventions will be utilized to teach:  1) how thoughts impact emotions, behavior, and decision-making.  2) how to monitor thoughts and feelings, and gain more insight into their relationship. 3) thinking errors and their associated impact on ourselves and perceptions of others 3) coping skills help the patient deal with problematic events in a more effective manner.  Long Term Goal(s): 1. Nonepileptic seizures will be significantly reduced and no longer interfere with her functioning. This will be measured via seizure calender and journal entries as well as corroborated by informant report (e.g., husband and daughter).  2. Increase insight and judgment.    3. Symptoms of depression will be significantly reduced and will no longer interfere with her functioning. This will be measured by a Minimal score on the BDI at the time of discharge.   Short Term Goal(s) 1.  Decrease the frequency of non-epileptic seizures 25-50% before end of the year (04/15/20); her last reported episode of having seizure-like activity was around first week of September, 2021.  2. Read chapters in workbook and participate/complete all therapeutic assignments. 3. Decrease symptoms of depression to subclinical level by 04/15/20.  4. She will report no suicidal ideation for 4 consecutive weeks.   Expected duration of treatment: 6-12 months   ______________________ Thayer Headings, PsyD Licensed Psychologist (Provisional) Neuropsychologist

## 2020-01-24 ENCOUNTER — Other Ambulatory Visit: Payer: Self-pay | Admitting: Neurology

## 2020-01-26 ENCOUNTER — Encounter (HOSPITAL_BASED_OUTPATIENT_CLINIC_OR_DEPARTMENT_OTHER): Payer: Self-pay | Admitting: Psychology

## 2020-01-26 ENCOUNTER — Other Ambulatory Visit: Payer: Self-pay

## 2020-01-26 DIAGNOSIS — F445 Conversion disorder with seizures or convulsions: Secondary | ICD-10-CM

## 2020-01-27 ENCOUNTER — Encounter: Payer: Self-pay | Admitting: Psychology

## 2020-01-27 NOTE — Progress Notes (Addendum)
Virtual Visit via Telephone Note  We experienced technical problems with televideo conference function on [employer] and decided to speak via phone instead. I connected with Nancy Porter on 01/28/20 at 11:00 AM EDT by telephone and verified that I am speaking with the correct person using two identifiers.   I discussed the limitations, risks, security and privacy concerns of performing an evaluation and management service by telephone and the availability of in person appointments. I also discussed with the patient that there may be a patient responsible charge related to this service. The patient expressed understanding and agreed to proceed.  Patient's daughter expressed significant concern with some current medicationsm (I.e., clonazepam & nortriptyline) she is taking and worries about the long term effects of "neuroleptics". She fears patient may have tardive dyskinesia. She expressed significant frustration with previous doctors who reportedly  "think my mom is crazy and faking her seizures"; this has led her to mistrust other providers.  Patient stated that her seizures have increased during the last month and are now "much worse than before". She described "trying to hit myself" during such instances and said "I don't know why im doing that to myself". She also reported pain in her inner ear.    History of Present Illness: Nancy Tirpak Hightoweris a 49 y.o.femalehere as requested by Dr. 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 According Dr. Jarrett Ables medical notes, she has a longstanding history remarkable for anxiety, suicide attempt, tobacco use, ADHD. She has had a continuous headache since July 27th. She has Daily headaches and >15 migraine days a month for over a year but worsening to daily migraines since July 27th. The headache is unilateral, but can be bilateral temporal and periorbital, throbbing/pulsating/pounding, +photo/phonophobia, +nasuea, +vomiting, movement makes  it worse, no aura, she takes abortive medication daily, she has no medication overuse, unknown inciting events, she has been to other neurologists, she has been to [facility], she has been to the emergency room and has been admitted. Severe pain. Migraine lasts 24-72 hours. She denies any OTC medication use since July. She even stopped the Benadryl. She describes lots of pressure. No aura. No medication overuse, no aura. She has been to the eye doctor, she has had imaging that is normal. The episodes are severe. She has associated tingling and some dizziness. She is worried and anxious. Husband says she is under stress, worried, working late, constantly going without a break. She ha numbness on the right nd now she is having them on the left. She will zone out blank stare.Meds tried: butalbital, imitrex, advil, tylenol, excedrin, topamax, benadryl, ativan, DHE, compazine, toradol, zomig, nortriptyline, prednisone, nortriptyline, trazodone   Observations/Objective:  Mood appeared depressed and agitated. Speech was slowed, somewhat slurred, and somewhat high pitched. Thoughts were mostly  coherent and relatively logical, but she appeared mildly confused and disoriented.   Assessment and Plan:   Intervention: Emotional support and rapport building rapport     Weekly to biweekly participation in modified CBT for conversion disorder with seizures and convulsion. She may require more extensive intervention given possibility that pre-existing personality and/or psychiatrist factors are playing a role in the development and persistance of nonepileptic seizures. I discussed findings from the initial assessment and reviewed treatment plan with the patient.  We discussed having patient's daughter join our next visit to provide psychoeducation about nonepileptic seizures, as well as to gather additional information regarding patients history/current symptoms, and answer any related questions in person to the extent I am  able to. Patient  and daughter agreed to this and expressed appreciation for the call and willingness to provide therapeutic services.   Follow Up Instructions:  Front office will reach out to patient and schedule and in-person visit that fits with daughter's schedule so she can attend. The patient and daughter were provided an opportunity to ask questions and all were answered. The patient agreed with the plan and  demonstrated an understanding of when her  I provided 30 minutes of non-face-to-face time during this encounter.   Horton Finer, PsyD

## 2020-01-28 NOTE — Addendum Note (Signed)
Addended by: Thayer Headings R on: 01/28/2020 05:55 PM   Modules accepted: Level of Service

## 2020-02-09 ENCOUNTER — Encounter: Payer: Self-pay | Admitting: Psychology

## 2020-02-09 ENCOUNTER — Other Ambulatory Visit: Payer: Self-pay

## 2020-02-09 DIAGNOSIS — F445 Conversion disorder with seizures or convulsions: Secondary | ICD-10-CM

## 2020-02-09 DIAGNOSIS — F17200 Nicotine dependence, unspecified, uncomplicated: Secondary | ICD-10-CM

## 2020-02-09 DIAGNOSIS — F1220 Cannabis dependence, uncomplicated: Secondary | ICD-10-CM

## 2020-02-11 ENCOUNTER — Encounter: Payer: Self-pay | Admitting: Psychology

## 2020-02-11 NOTE — Progress Notes (Signed)
Subjective:    Patient ID: Nancy Porter is a 49 y.o. female.  Chief Complaint: Nancy Porter a 49 y.o.femalehere as requested by Dr. 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 She as a longstanding history remarkable for anxiety, suicide attempt, tobacco use, ADHD. She has had a continuousheadache since July 27th. She has Daily headaches and >15 migraine days a month for over a year but worsening to daily migraines since July 27th. The headache is unilateral, but can be bilateral temporal and periorbital, throbbing/pulsating/pounding, +photo/phonophobia, +nasuea, +vomiting, movement makes it worse, no aura, she takes abortive medication daily, she has no medication overuse, unknown inciting events, she has been to other neurologists, she has been to [facility], she has been to the emergency room and has been admitted. Severe pain. Migraine lasts 24-72 hours. She denies any OTC medication use since July. She even stopped the Benadryl. She describes lots of pressure. No aura. No medication overuse, no aura. She has been to the eye doctor, she has had imaging that is normal. The episodes are severe. She has associated tingling and some dizziness. She is worried and anxious. Husband says she is under stress, worried, working late, constantly going without a break. She ha numbness on the right nd now she is having them on the left. She will zone out blank stare.Meds tried: butalbital, imitrex, advil, tylenol, excedrin, topamax, benadryl, ativan, DHE, compazine, toradol, zomig, nortriptyline, prednisone, nortriptyline, trazodone  Patient showed this provider video daughter took of a recent nonepilepic seizure.  "My seizures have gotten worse"   Objective:  Physical Exam Psychiatric:        Mood and Affect: Mood is anxious and depressed. Mood is not elated. Affect is blunt, tearful and inappropriate. Affect is not labile, flat or angry.        Speech: She is communicative. Speech is  tangential. Speech is not rapid and pressured, delayed or slurred.        Behavior: Behavior is agitated and hyperactive. Behavior is not slowed, aggressive, withdrawn or combative. Behavior is cooperative.        Thought Content: Thought content normal. Thought content is not paranoid or delusional. Thought content does not include homicidal or suicidal ideation. Thought content does not include homicidal or suicidal plan.        Judgment: Judgment is impulsive and inappropriate.   Patient's eyes were observed closed in video of recent nonepileptic seizure along with opisthotonus and asynchronous limb movements.   Assessment:   Conversion disorder with seizures or convulsions  Marijuana dependence (HCC)  Tobacco use disorder   Intervention: Psychoeducation, communication of diagnosis and  treatment approach   Response/Effectiveness: Good and appropriate. She expressed interest in using workbook to help meet treatment goals.   Progress: Satisfactory towards meeting goals   Plan:   Plan to use Taking Control of Your Seizures (Reiter et al., 2015) workbook to decrease frequency of nonepileptic seizures, improve functioning, and decrease symptoms of depression to subclinical level. Therapeutic interventions will be tailored to her individual needs . She will start using a seizure calendar and journal to effectively monitor nonepileptic episodes moving forward.   I will continue to meet with patient biweekly for psychotherapy to address treatment plan goals. Patient will continue to follow recommendations of providers and implement skills learned in session.  Medical Necessity: The desired outcome and level of functioning has not improved as outlined in the treatment plan.    Follow Up Instructions: I discussed the assessment and plan with the patient.  The patient was provided an opportunity to ask questions and all were answered. The patient agreed with the plan and demonstrated an  understanding of the instructions.  I spent 60 minutes face-to-face with patient during today's therapy appointment.

## 2020-02-23 ENCOUNTER — Encounter: Payer: Self-pay | Admitting: Psychology

## 2020-03-08 ENCOUNTER — Encounter: Payer: Self-pay | Attending: Psychology | Admitting: Psychology

## 2020-03-08 DIAGNOSIS — F445 Conversion disorder with seizures or convulsions: Secondary | ICD-10-CM | POA: Insufficient documentation

## 2020-03-08 DIAGNOSIS — F17200 Nicotine dependence, unspecified, uncomplicated: Secondary | ICD-10-CM | POA: Insufficient documentation

## 2020-03-08 DIAGNOSIS — F1220 Cannabis dependence, uncomplicated: Secondary | ICD-10-CM | POA: Insufficient documentation

## 2020-03-11 ENCOUNTER — Other Ambulatory Visit: Payer: Self-pay | Admitting: Critical Care Medicine

## 2020-03-11 DIAGNOSIS — Z720 Tobacco use: Secondary | ICD-10-CM

## 2020-03-11 DIAGNOSIS — R053 Chronic cough: Secondary | ICD-10-CM

## 2020-03-14 ENCOUNTER — Other Ambulatory Visit: Payer: Self-pay | Admitting: Neurology

## 2020-03-14 MED ORDER — NARATRIPTAN HCL 2.5 MG PO TABS
2.5000 mg | ORAL_TABLET | ORAL | 11 refills | Status: DC | PRN
Start: 2020-03-14 — End: 2020-07-18

## 2020-03-15 ENCOUNTER — Other Ambulatory Visit: Payer: Self-pay | Admitting: Neurology

## 2020-03-22 ENCOUNTER — Encounter: Payer: BC Managed Care – PPO | Admitting: Psychology

## 2020-04-05 ENCOUNTER — Ambulatory Visit: Payer: PRIVATE HEALTH INSURANCE | Admitting: Psychology

## 2020-04-19 ENCOUNTER — Encounter: Payer: BC Managed Care – PPO | Admitting: Psychology

## 2020-04-21 ENCOUNTER — Other Ambulatory Visit: Payer: Self-pay | Admitting: Neurology

## 2020-04-25 ENCOUNTER — Telehealth: Payer: Self-pay | Admitting: Neurology

## 2020-04-25 DIAGNOSIS — F32A Depression, unspecified: Secondary | ICD-10-CM | POA: Diagnosis not present

## 2020-04-25 DIAGNOSIS — M545 Low back pain, unspecified: Secondary | ICD-10-CM | POA: Diagnosis not present

## 2020-04-25 DIAGNOSIS — R569 Unspecified convulsions: Secondary | ICD-10-CM | POA: Diagnosis not present

## 2020-04-25 DIAGNOSIS — M5441 Lumbago with sciatica, right side: Secondary | ICD-10-CM | POA: Diagnosis not present

## 2020-04-25 DIAGNOSIS — R3915 Urgency of urination: Secondary | ICD-10-CM | POA: Diagnosis not present

## 2020-04-25 DIAGNOSIS — G43711 Chronic migraine without aura, intractable, with status migrainosus: Secondary | ICD-10-CM | POA: Diagnosis not present

## 2020-04-25 DIAGNOSIS — F17210 Nicotine dependence, cigarettes, uncomplicated: Secondary | ICD-10-CM | POA: Diagnosis not present

## 2020-04-25 NOTE — Telephone Encounter (Signed)
Pt called, have started having migraines, I need to get back on migraines medications. Can the nurse get me an earlier appt than 4/4 to see Dr. Lucia Gaskins. Would like a call from the nurse.

## 2020-04-25 NOTE — Telephone Encounter (Signed)
Pt is on the wait list. I called her. She cannot come this Thurs 1/13 at 3 pm (we had a cancellation). She will call back later this week to see if there is an opening for Monday. Pt needs anytime on a Monday or she can come Thursday AM. Pt verbalized appreciation for the call.

## 2020-04-26 ENCOUNTER — Telehealth: Payer: Self-pay | Admitting: Neurology

## 2020-04-26 NOTE — Telephone Encounter (Signed)
Pt. requests a call back from RN.

## 2020-04-26 NOTE — Telephone Encounter (Signed)
Would one of you mind calling and asking patient to send an email via mychart to Dr. Lucia Gaskins please? If she has any questions that would be the best way to get to Dr. Lucia Gaskins and staffing is very short due to covid, she may be waiting days or even a week for a nurse to call her back. Tell her she has to send an email instead for any non-urgent questions.  Or, she can be scheduled sooner with an NP if you can find an opening sooner.

## 2020-05-03 ENCOUNTER — Ambulatory Visit: Payer: PRIVATE HEALTH INSURANCE | Admitting: Psychology

## 2020-05-05 ENCOUNTER — Encounter: Payer: BC Managed Care – PPO | Attending: Psychology | Admitting: Psychology

## 2020-05-05 ENCOUNTER — Other Ambulatory Visit: Payer: Self-pay

## 2020-05-05 DIAGNOSIS — F1220 Cannabis dependence, uncomplicated: Secondary | ICD-10-CM | POA: Diagnosis not present

## 2020-05-05 DIAGNOSIS — F17200 Nicotine dependence, unspecified, uncomplicated: Secondary | ICD-10-CM | POA: Diagnosis not present

## 2020-05-05 DIAGNOSIS — F445 Conversion disorder with seizures or convulsions: Secondary | ICD-10-CM | POA: Insufficient documentation

## 2020-05-10 ENCOUNTER — Encounter: Payer: Self-pay | Admitting: Psychology

## 2020-05-10 NOTE — Progress Notes (Signed)
Subjective:    Patient ID: Nancy Porter is a 50 y.o. female.  Chief Complaint: HPI  Nancy Porter Hightoweris a 50 y.o.femalehere as requested by Dr. 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 She as a longstanding history remarkable for anxiety, suicide attempt, tobacco use, ADHD. She has had a continuousheadache since July 27th. She has Daily headaches and >15 migraine days a month for over a year but worsening to daily migraines since July 27th. The headache is unilateral, but can be bilateral temporal and periorbital, throbbing/pulsating/pounding, +photo/phonophobia, +nasuea, +vomiting, movement makes it worse, no aura, she takes abortive medication daily, she has no medication overuse, unknown inciting events, she has been to other neurologists, she has been to [facility], she has been to the emergency room and has been admitted. Severe pain. Migraine lasts 24-72 hours. She denies any OTC medication use since July. She even stopped the Benadryl. She describes lots of pressure. No aura. No medication overuse, no aura. She has been to the eye doctor, she has had imaging that is normal. The episodes are severe. She has associated tingling and some dizziness. She is worried and anxious. Husband says she is under stress, worried, working late, constantly going without a break. She ha numbness on the right nd now she is having them on the left. She will zone out blank stare.Meds tried: butalbital, imitrex, advil, tylenol, excedrin, topamax, benadryl, ativan, DHE, compazine, toradol, zomig, nortriptyline, prednisone, nortriptyline, trazodone  The following portions of the patient's history were reviewed and updated as appropriate: allergies, current medications, past family history, past medical history, past social history, past surgical history and problem list. Review of Systems  Neurological: Positive for dizziness, seizures (Psychogenic Nonepileptic), weakness (associated with carpal  tunnel ), light-headedness, numbness and headaches. Negative for tremors, syncope, facial asymmetry and speech difficulty.  Psychiatric/Behavioral: Positive for agitation, behavioral problems, confusion, decreased concentration, dysphoric mood, sleep disturbance and suicidal ideas (intermittent passive thoughts ). Negative for hallucinations and self-injury. The patient is nervous/anxious. The patient is not hyperactive.    (more to be included)  Medical History: Past Medical History:  Diagnosis Date  . Anxiety   . Arrhythmia   . COPD (chronic obstructive pulmonary disease) (HCC)   . Depression   . Deviated septum   . Hx of migraines   . Hx of suicide attempt 2010   overdose on Trileptal, has been on Seroquel in the past  . Hyperlipidemia   . Tobacco use    Current Medications:  Outpatient Encounter Medications as of 05/05/2020  Medication Sig  . albuterol (VENTOLIN HFA) 108 (90 Base) MCG/ACT inhaler TAKE 2 PUFFS BY MOUTH EVERY 6 HOURS AS NEEDED FOR WHEEZE OR SHORTNESS OF BREATH  . fluticasone (FLONASE) 50 MCG/ACT nasal spray SPRAY 2 SPRAYS INTO EACH NOSTRIL EVERY DAY  . guanFACINE (INTUNIV) 1 MG TB24 ER tablet Take 1 mg by mouth daily.  . montelukast (SINGULAIR) 10 MG tablet Take 1 tablet (10 mg total) by mouth at bedtime.  . naratriptan (AMERGE) 2.5 MG tablet Take 1 tablet (2.5 mg total) by mouth as needed for migraine. Take one (1) tablet at onset of headache; if returns or does not resolve, may repeat after 4 hours; do not exceed five (5) mg in 24 hours.  . nortriptyline (PAMELOR) 25 MG capsule TAKE 1 CAPSULE (25 MG TOTAL) BY MOUTH AT BEDTIME.  Marland Kitchen tolterodine (DETROL) 1 MG tablet Take 1 mg by mouth 2 (two) times daily.  Marland Kitchen topiramate (TOPAMAX) 100 MG tablet TAKE  1 TABLET BY MOUTH TWICE A DAY  . WIXELA INHUB 500-50 MCG/DOSE AEPB INHALE 1 PUFF BY MOUTH TWICE A DAY   No facility-administered encounter medications on file as of 05/05/2020.   Objective:  Physical Exam Psychiatric:         Attention and Perception: She is inattentive. She does not perceive auditory or visual hallucinations.        Mood and Affect: Mood is anxious and depressed. Mood is not elated. Affect is labile and tearful. Affect is not blunt, flat, angry or inappropriate.        Speech: She is communicative. Speech is tangential. Speech is not rapid and pressured, delayed or slurred.        Behavior: Behavior is agitated and combative (defensive and argumentative). Behavior is not slowed, aggressive, withdrawn or hyperactive.        Thought Content: Thought content is paranoid. Thought content is not delusional. Thought content does not include homicidal or suicidal ideation. Thought content does not include homicidal or suicidal plan.        Judgment: Judgment is impulsive and inappropriate.   Lab Review:  not applicable  Assessment:   Psychogenic nonepileptic seizure  Conversion disorder with seizures or convulsions  Marijuana dependence (HCC)  Tobacco use disorder   Intervention: Psychoeducation, Self-observation (e.g., Seizure Log and journal-keeping)   Participation: Alert and Active   Effectiveness/Response: Fair, appropriate response   Therapists Response: Reviewed Chapter 1 from Taking Control of Your Seizures (Reiter et al., 2015) workbook. Provided copies of seizure logs to complete at the end of each day to record the frequency and other characteristics of seizures (e.g., duration, description, location, severity, trigger(s), precursor(s), impact on day, impact on others, etc.). Explored and discussed possible obstacles and motivating factors that might impact therapy.     Agreed to schedule neuropsychological testing appointment to further aid in differential diagnosis, clinical decision-making, and to inform treatment.     Plan:   Utilize Taking Control of Your Seizures (Reiter et al., 2015) workbook along with therapist guide to decrease frequency of nonepileptic seizures,  improve functioning, and decrease symptoms of depression to subclinical level. This provided a validated, session by-session approach for helping patient "take control" of seizures and her life. Therapeutic interventions will be tailored to her individual needs. She will start using a seizure calendar and journal to effectively monitor nonepileptic episodes moving forward. Provided patient with copy of Chapters 1-3. Instructed her to read chapter 2 for homework and complete all written exercises.    Patient will attend biweekly psychotherapy appointments (60 min) to address treatment plan goals. Patient will continue to follow recommendations of providers and implement skills learned in session.   TESTING: There is medical necessity to proceed with neuropsychological assessment as the results will be used to aid in differential diagnosis, clinical decision-making, and to inform treatment. Per the patient, daughter and husband, and medical records reviewed, there has been a change in cognitive functioning and a reasonable suspicion of a neurocognitive disorder.   Clinical Decision Making: In considering the patient's current level of functioning, level of presumed impairment, nature of symptoms, emotional and behavioral responses during the initial interview and follow up therapy appointments, level of literacy, and observed level of motivation, a battery of tests was selected for patient to complete during separate testing appointment.  PLAN: The patient will return to complete the above referenced full battery of neuropsychological testing with this provider on 06/17/20. Education regarding testing procedures was provided to the patient.  Subsequently, the patient will see this provider for a follow-up session at which time her test performances and my impressions and treatment recommendations will be reviewed in detail.   Evaluation ongoing; full report to follow.   Follow Up Instructions: Ireviewed  plan with the patient; daughter present. The patient and daughter were provided opportunity to ask questions and all were answered. The patient agreed with the plan and demonstrated an understanding of the instructions.   I spent 60 minutes face-to-face with patient and daughter during today's therapy appointment.

## 2020-05-12 ENCOUNTER — Ambulatory Visit: Payer: BC Managed Care – PPO | Admitting: Pulmonary Disease

## 2020-05-15 ENCOUNTER — Other Ambulatory Visit: Payer: Self-pay | Admitting: Neurology

## 2020-05-15 DIAGNOSIS — G43709 Chronic migraine without aura, not intractable, without status migrainosus: Secondary | ICD-10-CM

## 2020-05-15 MED ORDER — UBRELVY 100 MG PO TABS: 100.0000 mg | ORAL_TABLET | ORAL | 11 refills | Status: DC | PRN

## 2020-05-17 ENCOUNTER — Ambulatory Visit: Payer: PRIVATE HEALTH INSURANCE | Admitting: Psychology

## 2020-05-19 DIAGNOSIS — Z20822 Contact with and (suspected) exposure to covid-19: Secondary | ICD-10-CM | POA: Diagnosis not present

## 2020-05-19 DIAGNOSIS — J0100 Acute maxillary sinusitis, unspecified: Secondary | ICD-10-CM | POA: Diagnosis not present

## 2020-05-19 DIAGNOSIS — J069 Acute upper respiratory infection, unspecified: Secondary | ICD-10-CM | POA: Diagnosis not present

## 2020-05-19 DIAGNOSIS — R5081 Fever presenting with conditions classified elsewhere: Secondary | ICD-10-CM | POA: Diagnosis not present

## 2020-05-21 ENCOUNTER — Other Ambulatory Visit: Payer: Self-pay | Admitting: Critical Care Medicine

## 2020-05-26 ENCOUNTER — Encounter: Payer: BC Managed Care – PPO | Admitting: Psychology

## 2020-06-13 ENCOUNTER — Ambulatory Visit: Payer: BC Managed Care – PPO | Admitting: Pulmonary Disease

## 2020-06-17 ENCOUNTER — Telehealth: Payer: Self-pay | Admitting: Psychology

## 2020-06-17 ENCOUNTER — Encounter: Payer: BC Managed Care – PPO | Admitting: Psychology

## 2020-06-17 NOTE — Telephone Encounter (Signed)
Patient would like a call from you about the testing.  She said she was never explained to about the type of testing she is going to have.  I have canceled all her appts with [place] until she speaks with you.  She couldn't come today due to having a bad migraine headache.

## 2020-06-21 ENCOUNTER — Other Ambulatory Visit: Payer: Self-pay | Admitting: Critical Care Medicine

## 2020-06-21 ENCOUNTER — Encounter: Payer: BC Managed Care – PPO | Admitting: Psychology

## 2020-06-21 NOTE — Telephone Encounter (Signed)
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 Judeth Horn tomorrow 3/9. Routing this request to him as an FYI that pt is requesting refill of med.

## 2020-06-22 ENCOUNTER — Ambulatory Visit: Payer: BC Managed Care – PPO | Admitting: Pulmonary Disease

## 2020-06-28 ENCOUNTER — Ambulatory Visit: Payer: BC Managed Care – PPO | Admitting: Psychology

## 2020-07-18 ENCOUNTER — Ambulatory Visit: Payer: BC Managed Care – PPO | Admitting: Pulmonary Disease

## 2020-07-18 ENCOUNTER — Other Ambulatory Visit: Payer: Self-pay

## 2020-07-18 ENCOUNTER — Encounter: Payer: Self-pay | Admitting: Pulmonary Disease

## 2020-07-18 ENCOUNTER — Ambulatory Visit: Payer: BC Managed Care – PPO | Admitting: Neurology

## 2020-07-18 ENCOUNTER — Other Ambulatory Visit: Payer: Self-pay | Admitting: Neurology

## 2020-07-18 ENCOUNTER — Encounter: Payer: Self-pay | Admitting: Neurology

## 2020-07-18 VITALS — BP 122/78 | HR 60 | Temp 97.9°F | Ht 66.0 in | Wt 143.0 lb

## 2020-07-18 VITALS — BP 91/61 | HR 63 | Ht 66.0 in | Wt 142.0 lb

## 2020-07-18 DIAGNOSIS — G43709 Chronic migraine without aura, not intractable, without status migrainosus: Secondary | ICD-10-CM

## 2020-07-18 DIAGNOSIS — F445 Conversion disorder with seizures or convulsions: Secondary | ICD-10-CM | POA: Diagnosis not present

## 2020-07-18 DIAGNOSIS — F32A Depression, unspecified: Secondary | ICD-10-CM | POA: Diagnosis not present

## 2020-07-18 DIAGNOSIS — R0600 Dyspnea, unspecified: Secondary | ICD-10-CM | POA: Diagnosis not present

## 2020-07-18 DIAGNOSIS — R202 Paresthesia of skin: Secondary | ICD-10-CM | POA: Diagnosis not present

## 2020-07-18 DIAGNOSIS — J4540 Moderate persistent asthma, uncomplicated: Secondary | ICD-10-CM | POA: Diagnosis not present

## 2020-07-18 DIAGNOSIS — R0609 Other forms of dyspnea: Secondary | ICD-10-CM

## 2020-07-18 DIAGNOSIS — Z720 Tobacco use: Secondary | ICD-10-CM

## 2020-07-18 DIAGNOSIS — G43711 Chronic migraine without aura, intractable, with status migrainosus: Secondary | ICD-10-CM | POA: Diagnosis not present

## 2020-07-18 DIAGNOSIS — J439 Emphysema, unspecified: Secondary | ICD-10-CM | POA: Diagnosis not present

## 2020-07-18 MED ORDER — BREO ELLIPTA 200-25 MCG/INH IN AEPB: 1.0000 | INHALATION_SPRAY | Freq: Every day | RESPIRATORY_TRACT | 11 refills | Status: DC

## 2020-07-18 MED ORDER — ERENUMAB-AOOE 70 MG/ML ~~LOC~~ SOAJ: 140.0000 mg | Freq: Once | SUBCUTANEOUS | Status: DC

## 2020-07-18 MED ORDER — AIMOVIG 140 MG/ML ~~LOC~~ SOAJ: 140.0000 mg | SUBCUTANEOUS | 11 refills | Status: DC

## 2020-07-18 NOTE — Patient Instructions (Signed)
[email or web address]  Erenumab injection What is this medicine? ERENUMAB (e REN ue mab) is used to prevent migraine headaches. This medicine may be used for other purposes; ask your health care provider or pharmacist if you have questions. COMMON BRAND NAME(S): Aimovig What should I tell my health care provider before I take this medicine? They need to know if you have any of these conditions:  an unusual or allergic reaction to erenumab, latex, other medicines, foods, dyes, or preservatives  high blood pressure  pregnant or trying to get pregnant  breast-feeding How should I use this medicine? This medicine is for injection under the skin. You will be taught how to prepare and give this medicine. Use exactly as directed. Take your medicine at regular intervals. Do not take your medicine more often than directed. It is important that you put your used needles and syringes in a special sharps container. Do not put them in a trash can. If you do not have a sharps container, call your pharmacist or healthcare provider to get one. Talk to your pediatrician regarding the use of this medicine in children. Special care may be needed. Overdosage: If you think you have taken too much of this medicine contact a poison control center or emergency room at once. NOTE: This medicine is only for you. Do not share this medicine with others. What if I miss a dose? If you miss a dose, take it as soon as you can. If it is almost time for your next dose, take only that dose. Do not take double or extra doses. What may interact with this medicine? Interactions are not expected. This list may not describe all possible interactions. Give your health care provider a list of all the medicines, herbs, non-prescription drugs, or dietary supplements you use. Also tell them if you smoke, drink alcohol, or use illegal drugs. Some items may interact with your medicine. What should I watch for while using this  medicine? Tell your doctor or healthcare professional if your symptoms do not start to get better or if they get worse. What side effects may I notice from receiving this medicine? Side effects that you should report to your doctor or health care professional as soon as possible:  allergic reactions like skin rash, itching or hives, swelling of the face, lips, or tongue  chest pain  fast, irregular heartbeat  feeling faint or lightheaded  palpitations Side effects that usually do not require medical attention (report these to your doctor or health care professional if they continue or are bothersome):  constipation  muscle cramps  pain, redness, or irritation at site where injected This list may not describe all possible side effects. Call your doctor for medical advice about side effects. You may report side effects to FDA at 1-800-FDA-1088. Where should I keep my medicine? Keep out of the reach of children. You will be instructed on how to store this medicine. Throw away any unused medicine after the expiration date on the label. NOTE: This sheet is a summary. It may not cover all possible information. If you have questions about this medicine, talk to your doctor, pharmacist, or health care provider.  2021 Elsevier/Gold Standard (2018-08-18 15:43:58)

## 2020-07-18 NOTE — Progress Notes (Signed)
[facility]    Provider:  Dr Lucia GaskinsAhern Referring Provider: Mick SellFitzgerald, David P, MD Primary Care Physician:  Mick SellFitzgerald, David P, MD ,  CC:  Migraine and non-epileptiic-seizure(seizure-like activity)  Interval histroy 07/18/2020: She is not seeing Dr. Vella KohlerZusman anymore for conversion disorder. She did not have connection with him. 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 Her migraines are not well controlled. We can go back to Aimovig, she had no side effects. Bernita RaisinUbrelvy helps. She felt she was going round and round with zusman. Daughter says if they get into a fight she will have an episode 9daughter is here and provides most information) and happens in the setting of stress. She cannot drive for 6 months after having loss of consciousness.   Interval history September 24, 2019: Patient is here for a new issue/request: seizure-like activity, she was admitted to [facility] on May 31 and discharged on June 2 of this year, I reviewed notes from her admission: She presented with a 1 month history of intermittent headaches, seizure-like spells, subtle right sided weakness and subsequently admitted to the hospitalist service for further evaluation, right facial droop, EEG showed no seizure activity, MRI of the brain and MRA of the head without any acute etiologies, UDS positive for benzos and THC.  Diagnosed with pseudoseizures, video EEG was performed to events were recorded without concomitant EEG changes, no seizure or epileptiform discharges were seen throughout the recording.  No obvious headaches.  Psychiatry recommended discontinuation of Neurontin by Dr. Shelbie HutchingLindzen's opinion was that patient was on such a low dose it was okay to discontinue.  Zoloft was increased for her anxiety.  She was very tearful and stressful and upset at the pseudoseizure diagnosis and wanting to know why she still had  right-sided numbness weakness.  She is here with her husband. She was unhappy about her admission and her psychiatric referral, she is having "episodes" and feels like she was labeled as psychiatric. She was having 7 episodes every other hour 7-10 minutes long, husband provides much information, has been ongoing for months but sporadic and she went to the hospital because they got worse over memorial day weekend, she started shaking. She knows when it is coming, she is about to have one, the cheek will start twiching and she would lay down and both arms shaking, when she calmed down she feels better and wake up and be exhausted then complain about a headaches. She has a video, her head ir extended, she is hyperventilating, arms shaking, lasts 10 minutes. EEG caught several of the events, no correlate. She is better on the Klonopin.   Reviewed MRI of the brain and MRA of the head images agree with radiology reports below:  MRI HEAD WITHOUT CONTRAST  MRA HEAD WITHOUT CONTRAST  TECHNIQUE: Multiplanar, multiecho pulse sequences of the brain and surrounding structures were obtained without intravenous contrast. Angiographic images of the head were obtained using MRA technique without contrast.  COMPARISON:  CT head 09/14/2019  FINDINGS: MRI HEAD FINDINGS  Brain: No acute infarction, hemorrhage, hydrocephalus, extra-axial collection or mass lesion. No significant chronic ischemic change.  Vascular: Normal arterial flow voids  Skull and upper cervical spine: No focal skeletal lesion.  Sinuses/Orbits: Mild mucosal edema paranasal sinuses. Normal orbit bilaterally  Other: None  MRA HEAD FINDINGS  Internal carotid artery widely patent bilaterally. Anterior and middle cerebral arteries are normal and widely patent bilaterally  Both vertebral  arteries are patent without stenosis. PICA is patent bilaterally. AICA, superior cerebellar, posterior cerebral arteries widely patent  without stenosis. Basilar widely patent.  Negative for cerebral aneurysm.  IMPRESSION: Negative MRI head  Negative MRA head  Bmp, magnesium, cbc, unremarkable  B12 291, B1 normal, UDS +benzo and THC  Patient complains of symptoms per HPI as well as the following symptoms: seizure.stress,worrying . Pertinent negatives and positives per HPI. All others negative   Interval history 02/05/2019: Doing excellent on botox. - Doing excellent on Botox alone. NOT ON CGRP. Continue botox, > 75% improvement in frequency and severity of migraines.  Interval History 08/21/2018: Patient reports that the cgrp has not worked. Sine she stopped the botox and remained on the Aimovig The migraines have returned since she stopped the botox. At this time she is not takng the Aimovig and would like to go back to the botox. DIscussed this, will try and get both botox and cgrp approved but if we cannot then she will stop the cgrp in favor of botox   HPI 12/30/2017:  ACACIA LATORRE is a 50 y.o. female here as requested by Dr. Sampson Goon for migraines. PMHx anxiety, suicide attempt, tobacco use, ADHD. She has had a continuousheadache since July 27th. She has Daily headaches and >15 migraine days a month for over a year but worsening to daily migraines since July 27th. The headache is unilateral, but can be bilateral temporal and periorbital, throbbing/pulsating/pounding, +photo/phonophobia, +nasuea, +vomiting, movement makes it worse, no aura, she takes abortive medication daily, she has no medication overuse, unknown inciting events, she has been to other neurologists, she has been to [facility], she has been to the emergency room and has been admitted. Severe pain. Migraine lasts 24-72 hours. She denies any OTC medication use since July. She even stopped the Benadryl. She describes lots of pressure. No aura. No medication overuse, no aura. She has been to the eye doctor, she has had imaging that is normal. The episodes are  severe. She has associated tingling and some dizziness. She is worried and anxious. Husband says she is under stress, worried, working late, constantly going without a break. She ha numbness on the right nd now she is having them on the left. She will zone out blank stare.   meds tried: butalbital, imitrex, advil, tylenol, excedrin, topamax, benadryl, ativan, DHE, compazine, toradol, zomig, nortriptyline, prednisone, nortriptyline, trazodone  Reviewed notes, labs and imaging from outside physicians, which showed:  Personally reviewed images on CD patient brought MRI brain and MRA head appears normal  Reviewed notes from headache clinic [facility] 12/2017. Migraine for 19 days, intense pressure, light sensitivity, has been to the ED, has seen neurosurgery, taking excedrin and imitrex, daily headache in the forehead and temporal regions, wors ein the afternoons, headaches for 20 years. Neuro exam was normal. Medication overuse was disussed. Also seen prior 8/16 for 3 weeks of throbbing headaches. She is "going insane", headaches 3-4 days a week.   11/20/2017: BUn 11, creatinine 0.59  Patient complains of symptoms per HPI as well as the following symptoms: PNES . Pertinent negatives and positives per HPI. All others negative    Social History   Socioeconomic History  . Marital status: Married    Spouse name: Renae Fickle  . Number of children: 2  . Years of education: some college  . Highest education level: Some college, no degree  Occupational History  . Occupation: Scientist, physiological: [employer]  Tobacco Use  .  Smoking status: Current Every Day Smoker    Packs/day: 1.00    Years: 35.00    Pack years: 35.00    Types: Cigarettes    Last attempt to quit: 01/08/2019    Years since quitting: 1.5  . Smokeless tobacco: Never Used  . Tobacco comment: now 1/2 ppd   Vaping Use  . Vaping Use: Some days  Substance and Sexual Activity  . Alcohol use: Yes    Comment:  occ maybe 1-2 a month  . Drug use: Yes    Frequency: 7.0 times per week    Types: Marijuana    Comment: cocaine in the past  . Sexual activity: Not on file  Other Topics Concern  . Not on file  Social History Narrative   Patient is left-handed. She lives with her husband and daughter in a 1 story house.  She has not been exercising in the last year.    3 cups coffee daily.   Social Determinants of Health   Financial Resource Strain: Not on file  Food Insecurity: Not on file  Transportation Needs: Not on file  Physical Activity: Not on file  Stress: Not on file  Social Connections: Not on file  Intimate Partner Violence: Not on file    Family History  Problem Relation Age of Onset  . Glaucoma Mother   . Heart disease Mother   . Heart Problems Mother   . Arrhythmia Mother        fib  . Deep vein thrombosis Father   . Heart Problems Father   . Heart disease Father   . Migraines Sister   . Stroke Maternal Grandmother   . Aneurysm Maternal Grandmother   . Heart disease Maternal Grandmother   . Diabetes Paternal Uncle        adult onset  . CAD Maternal Grandfather   . Seizures Neg Hx     Past Medical History:  Diagnosis Date  . Anxiety   . Arrhythmia   . COPD (chronic obstructive pulmonary disease) (HCC)   . Depression   . Deviated septum   . Hx of migraines   . Hx of suicide attempt 2010   overdose on Trileptal, has been on Seroquel in the past  . Hyperlipidemia   . Tobacco use     Past Surgical History:  Procedure Laterality Date  . ABDOMINAL HYSTERECTOMY    . AUGMENTATION MAMMAPLASTY Bilateral    implants 30 years ago-saline  . BREAST BIOPSY Right 08/19/2019   Affirm bx-"coil" clip-path pending  . BREAST ENHANCEMENT SURGERY    . CESAREAN SECTION     x 2  . SHOULDER SURGERY Left    impingement  . TRANSUMBILICAL AUGMENTATION MAMMAPLASTY      Current Outpatient Medications  Medication Sig Dispense Refill  . albuterol (VENTOLIN HFA) 108 (90 Base)  MCG/ACT inhaler TAKE 2 PUFFS BY MOUTH EVERY 6 HOURS AS NEEDED FOR WHEEZE OR SHORTNESS OF BREATH 8.5 each 2  . Erenumab-aooe (AIMOVIG) 140 MG/ML SOAJ Inject 140 mg into the skin every 30 (thirty) days. 1.12 mL 11  . fluticasone (FLONASE) 50 MCG/ACT nasal spray SPRAY 2 SPRAYS INTO EACH NOSTRIL EVERY DAY 16 mL 0  . guanFACINE (INTUNIV) 1 MG TB24 ER tablet Take 1 mg by mouth daily.    . montelukast (SINGULAIR) 10 MG tablet TAKE 1 TABLET BY MOUTH EVERYDAY AT BEDTIME 30 tablet 5  . nortriptyline (PAMELOR) 25 MG capsule TAKE 1 CAPSULE (25 MG TOTAL) BY MOUTH AT BEDTIME. 90 capsule  1  . topiramate (TOPAMAX) 100 MG tablet TAKE 1 TABLET BY MOUTH TWICE A DAY 180 tablet 1  . Ubrogepant (UBRELVY) 100 MG TABS Take 100 mg by mouth every 2 (two) hours as needed. Maximum 200mg  a day. 16 tablet 11  . sertraline (ZOLOFT) 100 MG tablet Take 2 tablets (200 mg total) by mouth at bedtime. 60 tablet 0   Current Facility-Administered Medications  Medication Dose Route Frequency Provider Last Rate Last Admin  . Erenumab-aooe SOAJ 140 mg  140 mg Subcutaneous Once , MD        Allergies as of 07/18/2020  . (No Known Allergies)    Vitals: BP 91/61 (BP Location: Left Arm, Patient Position: Sitting)   Pulse 63   Ht 5\' 6"  (1.676 m)   Wt 142 lb (64.4 kg)   LMP  (LMP Unknown)   BMI 22.92 kg/m  Last Weight:  Wt Readings from Last 1 Encounters:  07/18/20 142 lb (64.4 kg)   Last Height:   Ht Readings from Last 1 Encounters:  07/18/20 5\' 6"  (1.676 m)   Exam: NAD, pleasant                  Speech:    Speech is normal; fluent and spontaneous with normal comprehension.  Cognition:    The patient is oriented to person, place, and time;     recent and remote memory intact;     language fluent;    Cranial Nerves:    The pupils are equal, round, and reactive to light.Trigeminal sensation is intact and the muscles of mastication are normal. The face is symmetric. The palate elevates in the midline.  Hearing intact. Voice is normal. Shoulder shrug is normal. The tongue has normal motion without fasciculations.   Coordination:  No dysmetria  Motor Observation:    No asymmetry, no atrophy, and no involuntary movements noted. Tone:    Normal muscle tone.     Strength:    Strength is V/V in the upper and lower limbs.      Sensation: intact to LT        Assessment/Plan:  50 year old with chronic intractable headaches.  She also had a recent hospitalization and diagnosed with psychogenic pseudoseizures.  PNES: Unhappy with Dr. 09/17/20 I am will ask Dr. for any ideas where to go, I also left a message for 44 for therapist recommendation. Migraines: restart Aimovig. Continue ubrelvy.  Meds ordered this encounter  Medications  . Erenumab-aooe (AIMOVIG) 140 MG/ML SOAJ    Sig: Inject 140 mg into the skin every 30 (thirty) days.    Dispense:  1.12 mL    Refill:  11  . Erenumab-aooe SOAJ 140 mg    Vella Kohler 6/23 gave er 4 samples injected 2 roday     PRIOR  I had a very long talk with patient and husband.  Initially they were quite upset because they were told "this is all in your head" per report, however I did talk to them about conversion disorder, that emotional problems can definitely present themselves physically, that it is not "all in her head" as she did have these episodes; the episodes are real, the cause is not from an abnormal electrical activity in the brain but I do think it is from stress, anxiety, worrying, she does see a psychiatrist, I recommended seeing a therapist.  I also gave her a book about nonepileptic and psychogenic pseudoseizures, I think that she understood and hopefully she  will read the book and tell me what she thinks.  I did tell her however that we could do further testing, but she has not had any episodes since being on the Klonopin, I did review a video that she had which was not consistent with seizures (head extension,  hyperventilating, shaking of limbs variably), not consistent with physiologic neurologic etiology.  I tried to reassure her that there are many people who have nonepileptic, psychogenic. Will check MMA since B12 was 291.   Meds ordered this encounter  Medications  . Erenumab-aooe (AIMOVIG) 140 MG/ML SOAJ    Sig: Inject 140 mg into the skin every 30 (thirty) days.    Dispense:  1.12 mL    Refill:  11  . Erenumab-aooe SOAJ 140 mg    [id number] 6/23 gave er 4 samples injected 2 roday   No orders of the defined types were placed in this encounter.     PRIOR:  Doing excellent on botox. - Doing excellent on Botox alone. NOT ON CGRP. Continue botox, > 75% improvement in frequency and severity of migraines.  I will Not charge her, this could have been a nurse call she didn't need an appointment to ask if we can continue Botox.   To prevent or relieve headaches, try the following: Cool Compress. Lie down and place a cool compress on your head.  Avoid headache triggers. If certain foods or odors seem to have triggered your migraines in the past, avoid them. A headache diary might help you identify triggers.  Include physical activity in your daily routine. Try a daily walk or other moderate aerobic exercise.  Manage stress. Find healthy ways to cope with the stressors, such as delegating tasks on your to-do list.  Practice relaxation techniques. Try deep breathing, yoga, massage and visualization.  Eat regularly. Eating regularly scheduled meals and maintaining a healthy diet might help prevent headaches. Also, drink plenty of fluids.  Follow a regular sleep schedule. Sleep deprivation might contribute to headaches Consider biofeedback. With this mind-body technique, you learn to control certain bodily functions -- such as muscle tension, heart rate and blood pressure -- to prevent headaches or reduce headache pain.    Proceed to emergency room if you experience new or worsening symptoms or  symptoms do not resolve, if you have new neurologic symptoms or if headache is severe, or for any concerning symptom.   Provided education and documentation from American headache Society toolbox including articles on: chronic migraine medication overuse headache, chronic migraines, prevention of migraines, behavioral and other nonpharmacologic treatments for headache.    Cc: Mick Sell, MD  Naomie Dean, MD  [facility] [address] [place], [place] [address]  Phone [phone] Fax [phone]-  I spent 30 minutes of face-to-face and non-face-to-face time with patient on the  1. Chronic migraine without aura without status migrainosus, not intractable   2. Psychogenic nonepileptic seizure    diagnosis.  This included previsit chart review, lab review, study review, order entry, electronic health record documentation, patient education on the different diagnostic and therapeutic options, counseling and coordination of care, risks and benefits of management, compliance, or risk factor reduction

## 2020-07-18 NOTE — Patient Instructions (Signed)
It was nice to meet you  Continue the montelukast for allergies and asthma.  I prescribed a new inhaler called Breo.  Use this every day, 1 puff daily.  Rinse out your mouth after every use.  This is very similar to the [place] you on before.  It appears your insurance policy does not cover this at this time.  Breo also has the added advantage of being once a day instead of twice a day.  If the co-pay is too high please call us and let us know.  Try to cut back on your cigarette smoking.  I recommend you decrease the amount of cigarettes you smoke by 1 every 7 to 14 days.  This will help with withdrawal symptoms.  I believe you can pick up nicotine gum over-the-counter but if you would like a prescription please let me know I am happy to provide this.  I will see you back for follow-up in 8 weeks with Dr. Judeth Horn, at that time we can discuss if additional testing is needed.

## 2020-07-19 DIAGNOSIS — J4540 Moderate persistent asthma, uncomplicated: Secondary | ICD-10-CM | POA: Insufficient documentation

## 2020-07-19 NOTE — Progress Notes (Signed)
Synopsis: Referred in October 2020 for abnormal CT by Mick Sell, MD.  Subjective:   PATIENT ID: Nancy Porter GENDER: female DOB: 11-14-1970, MRN: [id number]  Chief Complaint  Patient presents with  . Establish Care    Former PC patient for emphysema. States she had to D/C her [place] last year due to not having insurance. Wants to restart this if possible since she has insurance. Increased SOB with activity.      Nancy Porter is a 50 year old woman who presents for follow-up with her daughter.  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  She had a recent recurrent pulmonary infections, which was determined to be Pseudomonas when sputum cultures were finally obtained.  She was treated with 14 days of levofloxacin 04/2019.  She endorses worsening DOE over last several months. This is in setting of not using medications ans insurance lapsed with change of job via partner. Describes worsened cough, increased DOE, SOB at rest, chest tightness. Albuterol helps some. Not using any other inhalers. Using montelukast. Previously on Spiriva and high dose wixela. She has right sided neck pain that comes to find out was related to protrusion from cervical spine per her report. She was worried it was because of her emphysema. CT reviewed with patient and interpreted as mild apical emphysema.  Discussed at length that this pain would not be reduced emphysema in the absence of pneumothorax.  Discussed at length her concern for worsening breathing.     Past Medical History:  Diagnosis Date  . Anxiety   . Arrhythmia   . COPD (chronic obstructive pulmonary disease) (HCC)   . Depression   . Deviated septum   . Hx of migraines   . Hx of suicide attempt 2010   overdose on Trileptal, has been on Seroquel in the past  . Hyperlipidemia   . Tobacco use      Family History  Problem Relation Age of Onset  . Glaucoma Mother   . Heart disease Mother   . Heart  Problems Mother   . Arrhythmia Mother        fib  . Deep vein thrombosis Father   . Heart Problems Father   . Heart disease Father   . Migraines Sister   . Stroke Maternal Grandmother   . Aneurysm Maternal Grandmother   . Heart disease Maternal Grandmother   . Diabetes Paternal Uncle        adult onset  . CAD Maternal Grandfather   . Seizures Neg Hx      Past Surgical History:  Procedure Laterality Date  . ABDOMINAL HYSTERECTOMY    . AUGMENTATION MAMMAPLASTY Bilateral    implants 30 years ago-saline  . BREAST BIOPSY Right 08/19/2019   Affirm bx-"coil" clip-path pending  . BREAST ENHANCEMENT SURGERY    . CESAREAN SECTION     x 2  . SHOULDER SURGERY Left    impingement  . TRANSUMBILICAL AUGMENTATION MAMMAPLASTY      Social History   Socioeconomic History  . Marital status: Married    Spouse name: Renae Fickle  . Number of children: 2  . Years of education: some college  . Highest education level: Some college, no degree  Occupational History  . Occupation: Scientist, physiological: [employer]  Tobacco Use  . Smoking status: Current Every Day Smoker    Packs/day: 1.00    Years: 35.00    Pack years: 35.00    Types: Cigarettes  Last attempt to quit: 01/08/2019    Years since quitting: 1.5  . Smokeless tobacco: Never Used  . Tobacco comment: now 1/2 ppd   Vaping Use  . Vaping Use: Some days  Substance and Sexual Activity  . Alcohol use: Yes    Comment: occ maybe 1-2 a month  . Drug use: Yes    Frequency: 7.0 times per week    Types: Marijuana    Comment: cocaine in the past  . Sexual activity: Not on file  Other Topics Concern  . Not on file  Social History Narrative   Patient is left-handed. She lives with her husband and daughter in a 1 story house.  She has not been exercising in the last year.    3 cups coffee daily.   Social Determinants of Health   Financial Resource Strain: Not on file  Food Insecurity: Not on file  Transportation  Needs: Not on file  Physical Activity: Not on file  Stress: Not on file  Social Connections: Not on file  Intimate Partner Violence: Not on file     No Known Allergies   Immunization History  Administered Date(s) Administered  . Influenza,inj,Quad PF,6+ Mos 01/22/2019    Outpatient Medications Prior to Visit  Medication Sig Dispense Refill  . albuterol (VENTOLIN HFA) 108 (90 Base) MCG/ACT inhaler TAKE 2 PUFFS BY MOUTH EVERY 6 HOURS AS NEEDED FOR WHEEZE OR SHORTNESS OF BREATH 8.5 each 2  . Erenumab-aooe (AIMOVIG) 140 MG/ML SOAJ Inject 140 mg into the skin every 30 (thirty) days. 1.12 mL 11  . fluticasone (FLONASE) 50 MCG/ACT nasal spray SPRAY 2 SPRAYS INTO EACH NOSTRIL EVERY DAY 16 mL 0  . guanFACINE (INTUNIV) 1 MG TB24 ER tablet Take 1 mg by mouth daily.    . montelukast (SINGULAIR) 10 MG tablet TAKE 1 TABLET BY MOUTH EVERYDAY AT BEDTIME 30 tablet 5  . nortriptyline (PAMELOR) 25 MG capsule TAKE 1 CAPSULE (25 MG TOTAL) BY MOUTH AT BEDTIME. 90 capsule 1  . topiramate (TOPAMAX) 100 MG tablet TAKE 1 TABLET BY MOUTH TWICE A DAY 180 tablet 1  . Ubrogepant (UBRELVY) 100 MG TABS Take 100 mg by mouth every 2 (two) hours as needed. Maximum 200mg  a day. 16 tablet 11  . sertraline (ZOLOFT) 100 MG tablet Take 2 tablets (200 mg total) by mouth at bedtime. 60 tablet 0   Facility-Administered Medications Prior to Visit  Medication Dose Route Frequency Provider Last Rate Last Admin  . Erenumab-aooe SOAJ 140 mg  140 mg Subcutaneous Once , MD          Objective:   Vitals:   07/18/20 1101  BP: 122/78  Pulse: 60  Temp: 97.9 F (36.6 C)  TempSrc: Temporal  SpO2: 100%  Weight: 143 lb (64.9 kg)  Height: 5\' 6"  (1.676 m)   100% on   RA BMI Readings from Last 3 Encounters:  07/18/20 23.08 kg/m  07/18/20 22.92 kg/m  09/24/19 21.30 kg/m   Wt Readings from Last 3 Encounters:  07/18/20 143 lb (64.9 kg)  07/18/20 142 lb (64.4 kg)  09/24/19 136 lb (61.7 kg)    Physical  Exam Vitals reviewed.  Constitutional:      Appearance: Normal appearance. She is not ill-appearing.  HENT:     Head: Normocephalic and atraumatic.  Eyes:     General: No scleral icterus. Cardiovascular:     Rate and Rhythm: Normal rate and regular rhythm.     Heart sounds: No murmur heard.  Pulmonary:     Comments: Breathing comfortably on room air, no conversational dyspnea.  Clear to auscultation bilaterally.  No witnessed coughing. Abdominal:     General: There is no distension.     Palpations: Abdomen is soft.  Musculoskeletal:        General: No swelling or deformity.     Cervical back: Neck supple.  Lymphadenopathy:     Cervical: No cervical adenopathy.  Skin:    General: Skin is warm and dry.     Findings: No rash.  Neurological:     Mental Status: She is alert.     Coordination: Coordination normal.  Psychiatric:        Mood and Affect: Mood normal.        Behavior: Behavior normal.       CBC    Component Value Date/Time   WBC 8.1 09/15/2019 0506   RBC 4.38 09/15/2019 0506   HGB 13.7 09/15/2019 0506   HCT 41.6 09/15/2019 0506   PLT 210 09/15/2019 0506   MCV 95.0 09/15/2019 0506   MCH 31.3 09/15/2019 0506   MCHC 32.9 09/15/2019 0506   RDW 12.5 09/15/2019 0506   LYMPHSABS 3.1 09/14/2019 1700   MONOABS 0.8 09/14/2019 1700   EOSABS 0.2 09/14/2019 1700   BASOSABS 0.1 09/14/2019 1700   05/18/2019 labs: IgE 48 Eosinophils 700  05/15/2019 sputum culture- Pseudomonas, pansensitive  Chest Imaging- films reviewed: CT chest 12/17/2018- no pulmonary embolism, no mediastinal or hilar adenopathy.  Minimally dilated distal esophagus.  Generous pulmonary artery, left atrium, ascending aorta. Mosaicism, airway thickening, peripheral tiny centrilobular nodules, few small intraparenchymal cysts.  CXR, 2 view 03/27/2019- airway thickening, no opacities.  Pulmonary Functions Testing Results: PFT Results Latest Ref Rng & Units 05/11/2019  FVC-Pre L 3.24  FVC-Predicted  Pre % 83  FVC-Post L 3.64  FVC-Predicted Post % 93  Pre FEV1/FVC % % 78  Post FEV1/FCV % % 83  FEV1-Pre L 2.53  FEV1-Predicted Pre % 82  FEV1-Post L 3.03  DLCO uncorrected ml/min/mmHg 14.71  DLCO UNC% % 63  DLVA Predicted % 74  TLC L 5.16  TLC % Predicted % 94  RV % Predicted % 94   Personally reviewed and interpreted as no fixed obstruction but significant bronchodilator reversibility is present.  No restriction, air trapping, or hyperinflation.  Mildly reduced diffusion capacity likely due to emphysema seen on imaging.     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  Consider adding back Spiriva once daily. -Continue Singulair and Allegra once daily -Continue Flonase daily. -At some point would benefit a repeat sputum culture, this was never collected a year ago when ordered   Dyspnea on exertion: In setting of medication nonadherence.  Likely worsening asthma symptoms based on description of symptoms.  She is concerned and wanted about repeat imaging, PFTs.  Counseled extensively that these studies are not to be very helpful especially with a have a much more likely diagnosis in an easy therapeutic intervention.  Can consider repeating these things in the future.  Resume asthma medicines as above.  Tobacco abuse: Extensively counseled to abstain from smoking.  Guaranteed her that her emphysema will worsen and has likely worsened over time with ongoing cigarette smoking.  Counseled her on a graduated decrease in cigarettes.  Happy to provide nicotine gum if she thinks will be helpful, she can contact our office.  Advise she not smoke marijuana and if needed consume  this via oral route.  Counseled that marijuana smoking will cause emphysema as well.   RTC in 3 months.  I spent 52 minutes in patient care including face-to-face visit, review of prior records, coordination of care.   Current Outpatient Medications:  .   albuterol (VENTOLIN HFA) 108 (90 Base) MCG/ACT inhaler, TAKE 2 PUFFS BY MOUTH EVERY 6 HOURS AS NEEDED FOR WHEEZE OR SHORTNESS OF BREATH, Disp: 8.5 each, Rfl: 2 .  Erenumab-aooe (AIMOVIG) 140 MG/ML SOAJ, Inject 140 mg into the skin every 30 (thirty) days., Disp: 1.12 mL, Rfl: 11 .  fluticasone (FLONASE) 50 MCG/ACT nasal spray, SPRAY 2 SPRAYS INTO EACH NOSTRIL EVERY DAY, Disp: 16 mL, Rfl: 0 .  fluticasone furoate-vilanterol (BREO ELLIPTA) 200-25 MCG/INH AEPB, Inhale 1 puff into the lungs daily., Disp: 60 each, Rfl: 11 .  guanFACINE (INTUNIV) 1 MG TB24 ER tablet, Take 1 mg by mouth daily., Disp: , Rfl:  .  montelukast (SINGULAIR) 10 MG tablet, TAKE 1 TABLET BY MOUTH EVERYDAY AT BEDTIME, Disp: 30 tablet, Rfl: 5 .  nortriptyline (PAMELOR) 25 MG capsule, TAKE 1 CAPSULE (25 MG TOTAL) BY MOUTH AT BEDTIME., Disp: 90 capsule, Rfl: 1 .  topiramate (TOPAMAX) 100 MG tablet, TAKE 1 TABLET BY MOUTH TWICE A DAY, Disp: 180 tablet, Rfl: 1 .  Ubrogepant (UBRELVY) 100 MG TABS, Take 100 mg by mouth every 2 (two) hours as needed. Maximum 200mg  a day., Disp: 16 tablet, Rfl: 11 .  sertraline (ZOLOFT) 100 MG tablet, Take 2 tablets (200 mg total) by mouth at bedtime., Disp: 60 tablet, Rfl: 0  Current Facility-Administered Medications:  .  Erenumab-aooe SOAJ 140 mg, 140 mg, Subcutaneous, Once, Anson FretAhern, Antonia B, MD   Karren BurlyMatthew R Violanda Bobeck, MD [facility] 07/19/2020 8:58 AM

## 2020-07-20 ENCOUNTER — Telehealth: Payer: Self-pay | Admitting: *Deleted

## 2020-07-20 NOTE — Telephone Encounter (Signed)
I gave her samples of aimovig. When she is close to running out have her email me and I will have to prescribe emgality or ajovy for her thanks

## 2020-07-20 NOTE — Telephone Encounter (Addendum)
Started a PA on Cover My Meds. Pt's plan requires Ajovy or Emgality tirl or contraindication before Aimovig can be covered. Looks like pt got samples at office visit yesterday to get started back on Aimovig.   Obtained insurance info from pt's pharmacy.  BIN: [id number] PCN: [id number] GROUP: [id number] ID: [id number]

## 2020-07-21 ENCOUNTER — Telehealth: Payer: Self-pay | Admitting: Psychology

## 2020-07-21 NOTE — Telephone Encounter (Signed)
Per Misty Stanley lvm to make apt for Patient with Dr Kieth Brightly. Schedule a 1 hr Therapy session with JR

## 2020-07-25 DIAGNOSIS — J439 Emphysema, unspecified: Secondary | ICD-10-CM | POA: Diagnosis not present

## 2020-07-25 DIAGNOSIS — R202 Paresthesia of skin: Secondary | ICD-10-CM | POA: Diagnosis not present

## 2020-07-25 DIAGNOSIS — F32A Depression, unspecified: Secondary | ICD-10-CM | POA: Diagnosis not present

## 2020-07-25 DIAGNOSIS — G43711 Chronic migraine without aura, intractable, with status migrainosus: Secondary | ICD-10-CM | POA: Diagnosis not present

## 2020-07-28 DIAGNOSIS — J4540 Moderate persistent asthma, uncomplicated: Secondary | ICD-10-CM

## 2020-07-28 NOTE — Telephone Encounter (Signed)
Received the following message from patient:   "Nancy Porter,  As I had mentioned in our first meeting (appointment) I have had more trouble breathing, feeling fatigued, out of breath very easily, and having a hard time catching my breath.  My cough has worsened, leaving soreness in my chest.    For the past three days, I have awaken with my eyes swollen shut, burning, red, and itchy.  This was not pink eye as I did (do not) have the eye "goo" that goes along with that diagnosis.  I feel like the Virgel Bouquet is not helping with my breathing as I am short of breath still all the time. I am taking my albuterol at least 4 times a day.  Since it has been so long since my last testing, I would ask again if we could test to see where my levels are.  I know that there is work on my end that needs to be done, but with the news below, I feel it would be in my best interest to have a new work up done.  Especially, taken in consideration how I currently feel as well.    After your appt I saw Dr. Sampson Goon and I mentioned to him the tightness and pain that I keep having in my chest as well as many abnormal readings on EKGs that come back normal. So Dr. Sampson Goon ordered a stress test.  At around the 4 minute mark I ended up having a seizure and it was also found that there was a-fib found in mid-recovery.  I am being referred to a cardiologist.    I am just asking that we check every corner.  I have been sick for a long time.  I am not questioning you at all, I just feel I need to advocate for myself.    Thank you, Nancy Porter"  MH, can you please advise? Thanks!

## 2020-07-28 NOTE — Telephone Encounter (Signed)
Dr. Hunsucker, Please see patient message and advise.  Thank you. 

## 2020-08-02 NOTE — Telephone Encounter (Signed)
Please advise on patient mychart message  Ok, thank you.  Can you also let Dr. Judeth Horn know that I had my stress test last week and ended up having a seizure at the 4 minute mark and there was a-fib found in mid-recovery.  I have an appt with a cardiologist on May 9th.

## 2020-08-03 NOTE — Telephone Encounter (Signed)
Attempted to call pt to further discuss lab orders especially the RAST Panel as it is on our list of expensive lab tests. Also we need to get pt scheduled for PFT and covid test prior.  Unable to reach pt. Left message for her to return call. I have pended all the orders. Once pt calls back, we can then place all orders including the RAST Panel if she is okay with having that done.

## 2020-08-10 NOTE — Telephone Encounter (Signed)
Pt returning a phone call. Pt can be reached at [phone]

## 2020-08-10 NOTE — Telephone Encounter (Signed)
I have spoken with patient and she is agreeable to getting labs done. They have been sent in and patient will come to the office to get them done. Patient also has an appt for pft followed by f/u with Dr. Judeth Horn on 09/06/2020.  Nothing further needed at this time. Will route to Dr. Judeth Horn as Lorain Childes.  Dr. Judeth Horn, just an FYI.

## 2020-08-24 DIAGNOSIS — I209 Angina pectoris, unspecified: Secondary | ICD-10-CM | POA: Diagnosis present

## 2020-09-06 ENCOUNTER — Ambulatory Visit: Payer: BC Managed Care – PPO | Admitting: Pulmonary Disease

## 2020-09-08 ENCOUNTER — Encounter: Payer: Self-pay | Admitting: Internal Medicine

## 2020-09-08 ENCOUNTER — Ambulatory Visit
Admission: RE | Admit: 2020-09-08 | Discharge: 2020-09-08 | Disposition: A | Discharge status: Home / Self Care | Payer: BC Managed Care – PPO | Attending: Internal Medicine | Admitting: Internal Medicine

## 2020-09-08 ENCOUNTER — Other Ambulatory Visit: Payer: Self-pay

## 2020-09-08 ENCOUNTER — Encounter
Admission: RE | Disposition: A | Discharge status: Home / Self Care | Payer: Self-pay | Source: Home / Self Care | Attending: Internal Medicine

## 2020-09-08 DIAGNOSIS — R079 Chest pain, unspecified: Secondary | ICD-10-CM | POA: Insufficient documentation

## 2020-09-08 DIAGNOSIS — I209 Angina pectoris, unspecified: Secondary | ICD-10-CM | POA: Diagnosis present

## 2020-09-08 DIAGNOSIS — R9430 Abnormal result of cardiovascular function study, unspecified: Secondary | ICD-10-CM

## 2020-09-08 HISTORY — PX: LEFT HEART CATH AND CORONARY ANGIOGRAPHY: CATH118249

## 2020-09-08 SURGERY — LEFT HEART CATH AND CORONARY ANGIOGRAPHY
Anesthesia: Moderate Sedation

## 2020-09-08 MED ORDER — HEPARIN SODIUM (PORCINE) 1000 UNIT/ML IJ SOLN
INTRAMUSCULAR | Status: AC
  Filled 2020-09-08: qty 1

## 2020-09-08 MED ORDER — ACETAMINOPHEN 325 MG PO TABS: 650.0000 mg | ORAL_TABLET | ORAL | Status: DC | PRN

## 2020-09-08 MED ORDER — SODIUM CHLORIDE 0.9 % WEIGHT BASED INFUSION: 1.0000 mL/kg/h | INTRAVENOUS | Status: DC

## 2020-09-08 MED ORDER — SODIUM CHLORIDE 0.9% FLUSH: 3.0000 mL | Freq: Two times a day (BID) | INTRAVENOUS | Status: DC

## 2020-09-08 MED ORDER — IOHEXOL 300 MG/ML  SOLN
INTRAMUSCULAR | Status: DC | PRN
  Administered 2020-09-08: 43 mL

## 2020-09-08 MED ORDER — ONDANSETRON HCL 4 MG/2ML IJ SOLN: 4.0000 mg | Freq: Four times a day (QID) | INTRAMUSCULAR | Status: DC | PRN

## 2020-09-08 MED ORDER — SODIUM CHLORIDE 0.9% FLUSH: 3.0000 mL | INTRAVENOUS | Status: DC | PRN

## 2020-09-08 MED ORDER — VERAPAMIL HCL 2.5 MG/ML IV SOLN
INTRAVENOUS | Status: DC | PRN
  Administered 2020-09-08: 2.5 mg via INTRAVENOUS

## 2020-09-08 MED ORDER — ASPIRIN 81 MG PO CHEW
81.0000 mg | CHEWABLE_TABLET | ORAL | Status: AC
  Administered 2020-09-08: 81 mg via ORAL

## 2020-09-08 MED ORDER — MIDAZOLAM HCL 2 MG/2ML IJ SOLN
INTRAMUSCULAR | Status: AC
  Filled 2020-09-08: qty 2

## 2020-09-08 MED ORDER — FENTANYL CITRATE (PF) 100 MCG/2ML IJ SOLN
INTRAMUSCULAR | Status: AC
  Filled 2020-09-08: qty 2

## 2020-09-08 MED ORDER — LABETALOL HCL 5 MG/ML IV SOLN: 10.0000 mg | INTRAVENOUS | Status: DC | PRN

## 2020-09-08 MED ORDER — ASPIRIN 81 MG PO CHEW
CHEWABLE_TABLET | ORAL | Status: AC
  Filled 2020-09-08: qty 1

## 2020-09-08 MED ORDER — SODIUM CHLORIDE 0.9 % WEIGHT BASED INFUSION
3.0000 mL/kg/h | INTRAVENOUS | Status: AC
  Administered 2020-09-08: 3 mL/kg/h via INTRAVENOUS

## 2020-09-08 MED ORDER — SODIUM CHLORIDE 0.9 % IV SOLN: 250.0000 mL | INTRAVENOUS | Status: DC | PRN

## 2020-09-08 MED ORDER — VERAPAMIL HCL 2.5 MG/ML IV SOLN
INTRAVENOUS | Status: AC
  Filled 2020-09-08: qty 2

## 2020-09-08 MED ORDER — HYDRALAZINE HCL 20 MG/ML IJ SOLN: 10.0000 mg | INTRAMUSCULAR | Status: DC | PRN

## 2020-09-08 MED ORDER — HEPARIN (PORCINE) IN NACL 2000-0.9 UNIT/L-% IV SOLN
INTRAVENOUS | Status: DC | PRN
  Administered 2020-09-08: 1000 mL

## 2020-09-08 MED ORDER — FENTANYL CITRATE (PF) 100 MCG/2ML IJ SOLN
INTRAMUSCULAR | Status: DC | PRN
  Administered 2020-09-08: 25 ug via INTRAVENOUS

## 2020-09-08 MED ORDER — MIDAZOLAM HCL 2 MG/2ML IJ SOLN
INTRAMUSCULAR | Status: DC | PRN
  Administered 2020-09-08: 0.5 mg via INTRAVENOUS

## 2020-09-08 MED ORDER — HEPARIN (PORCINE) IN NACL 1000-0.9 UT/500ML-% IV SOLN
INTRAVENOUS | Status: AC
  Filled 2020-09-08: qty 1000

## 2020-09-08 MED ORDER — HEPARIN SODIUM (PORCINE) 1000 UNIT/ML IJ SOLN
INTRAMUSCULAR | Status: DC | PRN
  Administered 2020-09-08: 3000 [IU] via INTRAVENOUS

## 2020-09-08 SURGICAL SUPPLY — 11 items

## 2020-09-08 NOTE — Progress Notes (Signed)
Dr. Gwen Pounds in at bedside, speaking with pt. Re: cath results. Both pt. And her hustand verbalized understanding of conversation.

## 2020-09-09 ENCOUNTER — Encounter: Payer: Self-pay | Admitting: Internal Medicine

## 2020-10-05 ENCOUNTER — Ambulatory Visit: Payer: BC Managed Care – PPO | Admitting: Pulmonary Disease

## 2020-10-16 ENCOUNTER — Other Ambulatory Visit: Payer: Self-pay | Admitting: Neurology

## 2020-10-18 ENCOUNTER — Other Ambulatory Visit: Payer: Self-pay | Admitting: Neurology

## 2020-10-27 ENCOUNTER — Telehealth: Payer: Self-pay

## 2020-10-27 NOTE — Telephone Encounter (Signed)
Disability forms faxed over to medical records. Nothing further is needed.

## 2020-11-30 DIAGNOSIS — Z0271 Encounter for disability determination: Secondary | ICD-10-CM

## 2021-02-18 IMAGING — DX DG CHEST 2V
2 series · 2 of 2 positions shown · non-contrast
Comparison: None.

CLINICAL DATA: Productive cough.

EXAM:
CHEST - 2 VIEW

[chest pa]
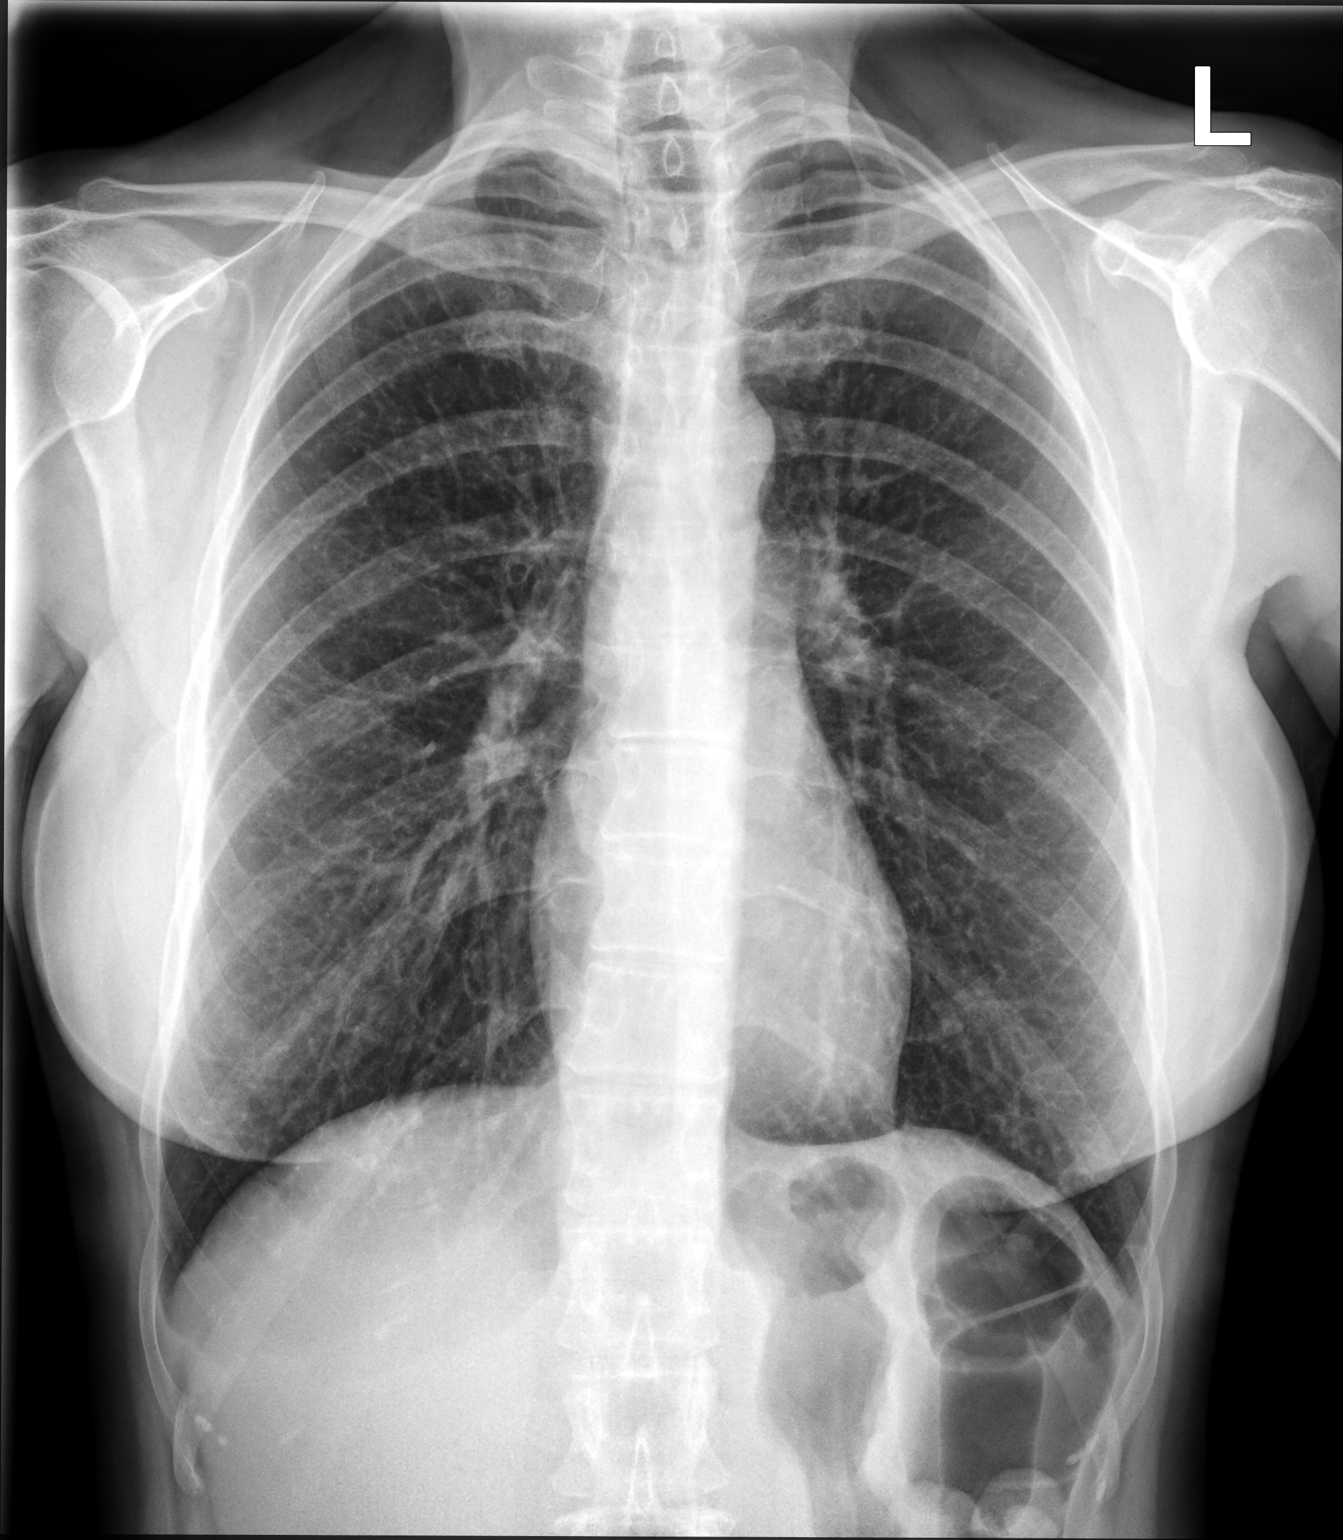

[chest lat]
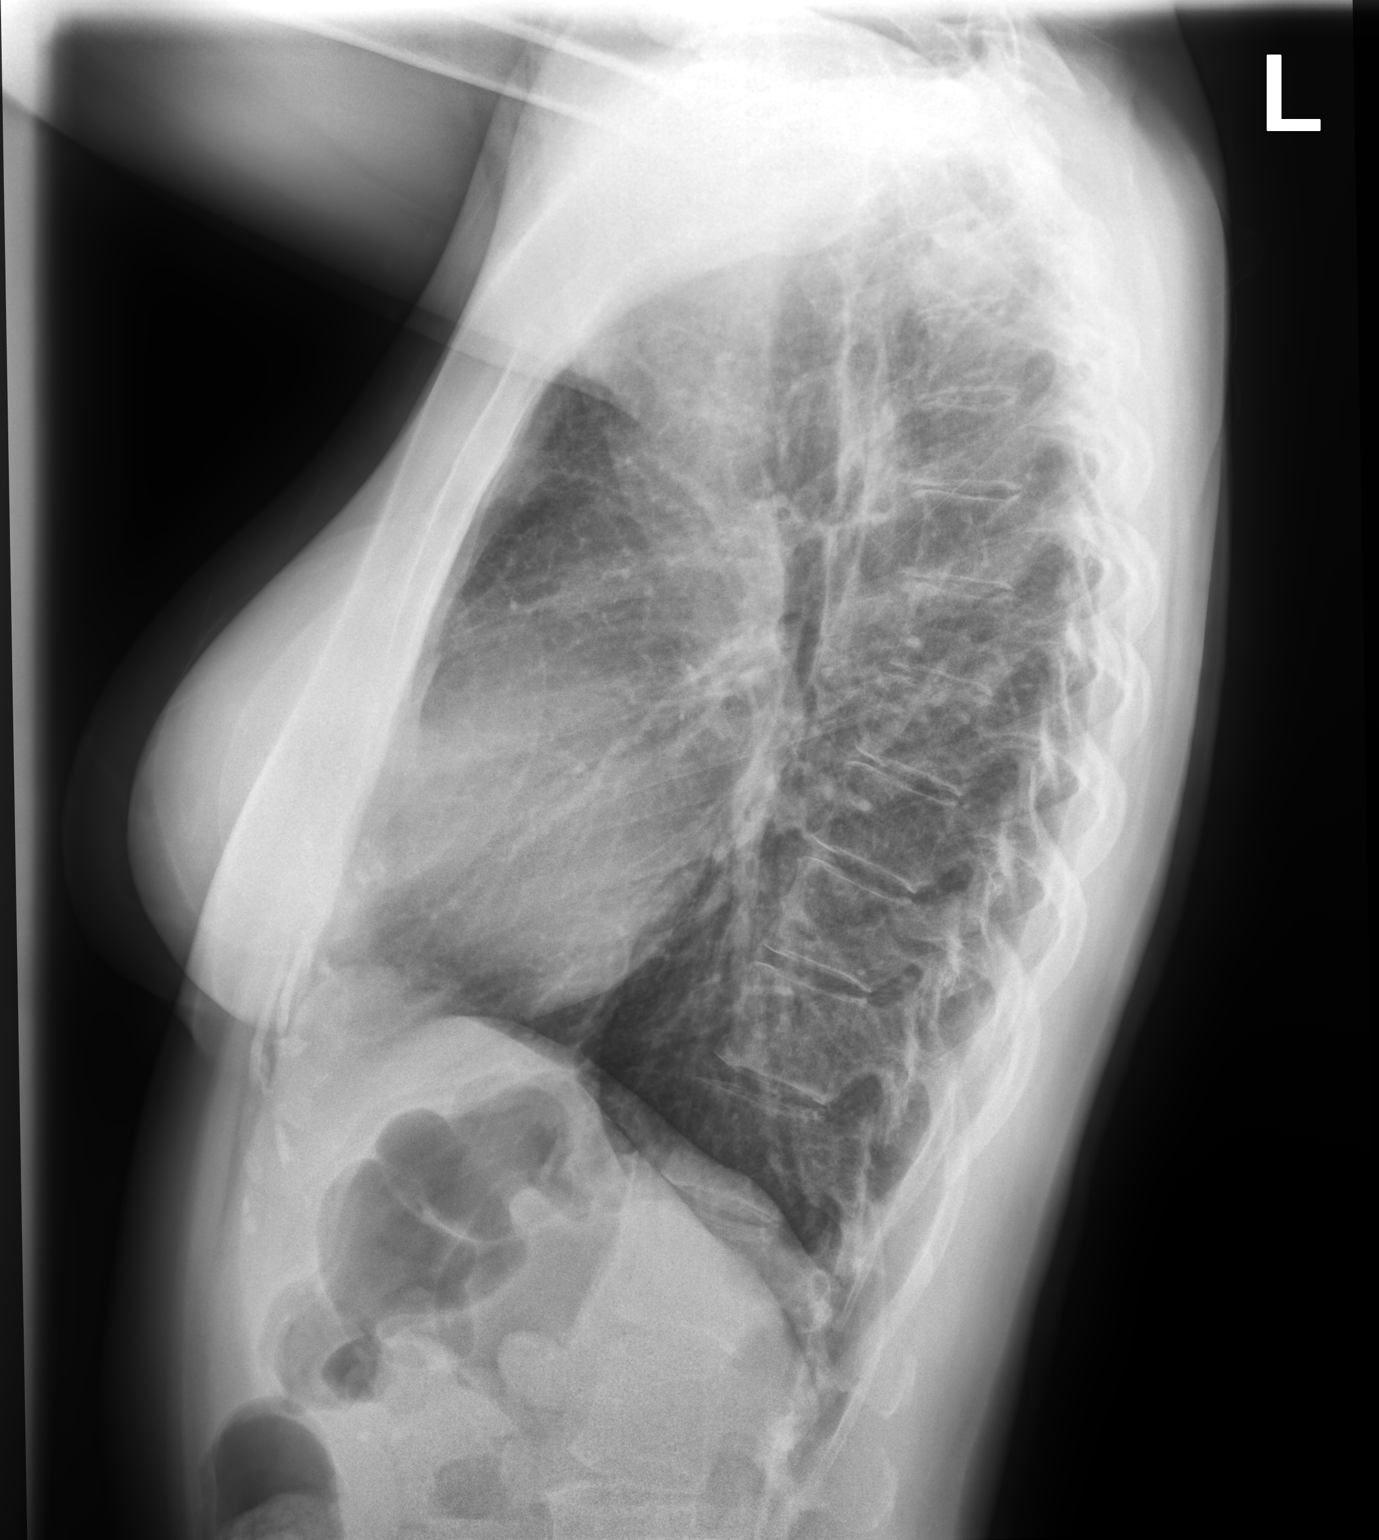

[2 of 2 positions shown; findings below may reference images not displayed]

FINDINGS: The heart size and mediastinal contours are within normal limits.
Both lungs are clear. The visualized skeletal structures are
unremarkable.
IMPRESSION: No active cardiopulmonary disease.

## 2021-04-25 ENCOUNTER — Other Ambulatory Visit: Payer: Self-pay | Admitting: Neurology

## 2021-05-26 ENCOUNTER — Encounter: Payer: Self-pay | Admitting: Neurology

## 2021-06-19 NOTE — Progress Notes (Signed)
[facility]    Provider:  Dr Lucia GaskinsAhern Referring Provider: Mick SellFitzgerald, David P, MD Primary Care Physician:  Mick SellFitzgerald, David P, MD ,  CC:  Migraine   Virtual Visit via Telephone Note  I connected with Nancy Porter on 06/20/21 at  7:30 AM EST by telephone and verified that I am speaking with the correct person using two identifiers.  Location: Patient: home Provider: office   I discussed the limitations, risks, security and privacy concerns of performing an evaluation and management service by telephone and the availability of in person appointments. I also discussed with the patient that there may be a patient responsible charge related to this service. The patient expressed understanding and agreed to proceed.  Follow Up Instructions:    I discussed the assessment and treatment plan with the patient. The patient was provided an opportunity to ask questions and all were answered. The patient agreed with the plan and demonstrated an understanding of the instructions.   The patient was advised to call back or seek an in-person evaluation if the symptoms worsen or if the condition fails to improve as anticipated.  I provided 35 minutes of non-face-to-face time during this encounter.   Anson FretAhern, Frederick Klinger B, MD   Interval history 06/20/2021: Here for follow up of migraines. 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 For her migraines she did excellent on botox, but she decided to change and restarted Aimovig and Continued ubrelvy at last appointment. Since then she does not have insurance. Aimovig helped her. She is on topamax.she is also on nortriptyline. Sent her the [employer] application for [employer]. She can't het the ubrelvy either. She does not get a lot of nausea. Triptans cause rebound headaches and has tried amerge which has a longer half life.  She has tried multiple triptans: naratriptan, rizatriptan, it doesn't work she gets rebound. She spoke about the non-epileptic events, I again told her that she needs therapy. She is still having "active seizures" and we spoke about  law that she cannot drive for 6 months until she is event free, her license has expired, I told her to discuss with DMV whether she can get her license or not. She is triggered by overstimulation per her daughter like anxiety attack, happens when multiple things are ongoing.  Patient complains of symptoms per HPI as well as the following symptoms: PNES, afib . Pertinent negatives and positives per HPI. All others negative   Interval histroy 07/18/2020: She is not seeing Dr. Vella KohlerZusman anymore for conversion disorder. She did not have connection with him. 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 Her migraines are not well controlled. We can go back to Aimovig, she had no side effects. Bernita RaisinUbrelvy helps. She felt she was going round and round with zusman. Daughter says if they get into a fight she will have an episode 9daughter is here and provides most information) and happens in the setting of stress. She cannot drive for 6 months after having loss of consciousness.     Interval history September 24, 2019: Patient is here for a new issue/request: seizure-like activity, [facility] and discharged on June 2 of this year, I reviewed notes from her admission: She presented with a 1 month history of intermittent headaches, seizure-like spells, subtle  right sided weakness and subsequently admitted to the hospitalist service for further evaluation, right facial droop, EEG showed no seizure activity, MRI of the brain and MRA of the head without any acute etiologies, UDS positive for benzos and THC.  Diagnosed with pseudoseizures, video EEG was performed to events were  recorded without concomitant EEG changes, no seizure or epileptiform discharges were seen throughout the recording.  No obvious headaches.  Psychiatry recommended discontinuation of Neurontin by Dr. Shelbie Hutching opinion was that patient was on such a low dose it was okay to discontinue.  Zoloft was increased for her anxiety.  She was very tearful and stressful and upset at the pseudoseizure diagnosis and wanting to know why she still had right-sided numbness weakness.  She is here with her husband. She was unhappy about her admission and her psychiatric referral, she is having "episodes" and feels like she was labeled as psychiatric. She was having 7 episodes every other hour 7-10 minutes long, husband provides much information, has been ongoing for months but sporadic and she went to the hospital because they got worse over memorial day weekend, she started shaking. She knows when it is coming, she is about to have one, the cheek will start twiching and she would lay down and both arms shaking, when she calmed down she feels better and wake up and be exhausted then complain about a headaches. She has a video, her head ir extended, she is hyperventilating, arms shaking, lasts 10 minutes. EEG caught several of the events, no correlate. She is better on the Klonopin.   Reviewed MRI of the brain and MRA of the head images agree with radiology reports below:  MRI HEAD WITHOUT CONTRAST   MRA HEAD WITHOUT CONTRAST   TECHNIQUE: Multiplanar, multiecho pulse sequences of the brain and surrounding structures were obtained without intravenous contrast. Angiographic images of the head were obtained using MRA technique without contrast.   COMPARISON:  CT head 09/14/2019   FINDINGS: MRI HEAD FINDINGS   Brain: No acute infarction, hemorrhage, hydrocephalus, extra-axial collection or mass lesion. No significant chronic ischemic change.   Vascular: Normal arterial flow voids   Skull and upper cervical spine:  No focal skeletal lesion.   Sinuses/Orbits: Mild mucosal edema paranasal sinuses. Normal orbit bilaterally   Other: None   MRA HEAD FINDINGS   Internal carotid artery widely patent bilaterally. Anterior and middle cerebral arteries are normal and widely patent bilaterally   Both vertebral arteries are patent without stenosis. PICA is patent bilaterally. AICA, superior cerebellar, posterior cerebral arteries widely patent without stenosis. Basilar widely patent.   Negative for cerebral aneurysm.   IMPRESSION: Negative MRI head   Negative MRA head  Bmp, magnesium, cbc, unremarkable  B12 291, B1 normal, UDS +benzo and THC  Patient complains of symptoms per HPI as well as the following symptoms: seizure.stress,worrying . Pertinent negatives and positives per HPI. All others negative   Interval history 02/05/2019: Doing excellent on botox. - Doing excellent on Botox alone. NOT ON CGRP. Continue botox, > 75% improvement in frequency and severity of migraines.  Interval History 08/21/2018: Patient reports that the cgrp has not worked. Sine she stopped the botox and remained on the Aimovig The migraines have returned since she stopped the botox. At this time she is not takng the Aimovig and would like to go back to the botox. DIscussed this, will try and get both botox and cgrp approved but if we cannot then she will stop the cgrp in favor of botox  HPI 12/30/2017:  Nancy Porter is a 51 y.o. female here as requested by Dr. Sampson GoonFitzgerald for migraines. PMHx anxiety, suicide attempt, tobacco use, ADHD. She has had a continuousheadache since July 27th. She has Daily headaches and >15 migraine days a month for over a year but worsening to daily migraines since July 27th. The headache is unilateral, but can be bilateral temporal and periorbital, throbbing/pulsating/pounding, +photo/phonophobia, +nasuea, +vomiting, movement makes it worse, no aura, she takes abortive medication daily, she has no  medication overuse, unknown inciting events, she has been to other neurologists, [facility], she has been to the emergency room and has been admitted. Severe pain. Migraine lasts 24-72 hours. She denies any OTC medication use since July. She even stopped the Benadryl. She describes lots of pressure. No aura. No medication overuse, no aura. She has been to the eye doctor, she has had imaging that is normal. The episodes are severe. She has associated tingling and some dizziness. She is worried and anxious. Husband says she is under stress, worried, working late, constantly going without a break. She ha numbness on the right nd now she is having them on the left. She will zone out blank stare.   meds tried: butalbital, imitrex, advil, tylenol, excedrin, topamax, benadryl, ativan, DHE, compazine, toradol, zomig, nortriptyline, prednisone, nortriptyline, trazodone  Reviewed notes, labs and imaging from outside physicians, which showed:  Personally reviewed images on CD patient brought MRI brain and MRA head appears normal  [facility] Brandy Leah [facility] 12/2017. Migraine for 19 days, intense pressure, light sensitivity, has been to the ED, has seen neurosurgery, taking excedrin and imitrex, daily headache in the forehead and temporal regions, wors ein the afternoons, headaches for 20 years. Neuro exam was normal. Medication overuse was disussed. Also seen prior 8/16 for 3 weeks of throbbing headaches. She is "going insane", headaches 3-4 days a week.   11/20/2017: BUn 11, creatinine 0.59  Patient complains of symptoms per HPI as well as the following symptoms: PNES . Pertinent negatives and positives per HPI. All others negative    Social History   Socioeconomic History   Marital status: Married    Spouse name: Renae Fickleaul   Number of children: 2   Years of education: some college   Highest education level: Some college, no degree  Occupational History   Occupation:  Scientist, physiologicalparalegal    Employer: [employer]  Tobacco Use   Smoking status: Every Day    Packs/day: 0.50    Years: 35.00    Pack years: 17.50    Types: Cigarettes    Last attempt to quit: 01/08/2019    Years since quitting: 2.4   Smokeless tobacco: Never   Tobacco comments:    now 1/2 ppd   Vaping Use   Vaping Use: Some days  Substance and Sexual Activity   Alcohol use: Yes    Comment: occ maybe 1-2 a month   Drug use: Yes    Frequency: 7.0 times per week    Types: Marijuana    Comment: cocaine in the past   Sexual activity: Not on file  Other Topics Concern   Not on file  Social History Narrative   Patient is left-handed. She lives with her husband and daughter in a 1 story house.  She has not been exercising in the last year.    1 cups coffee daily.   Social Determinants of Health   Financial Resource Strain: Not on  file  Food Insecurity: Not on file  Transportation Needs: Not on file  Physical Activity: Not on file  Stress: Not on file  Social Connections: Not on file  Intimate Partner Violence: Not on file    Family History  Problem Relation Age of Onset   Glaucoma Mother    Heart disease Mother    Heart Problems Mother    Arrhythmia Mother        fib   Deep vein thrombosis Father    Heart Problems Father    Heart disease Father    Migraines Sister    Stroke Maternal Grandmother    Aneurysm Maternal Grandmother    Heart disease Maternal Grandmother    Diabetes Paternal Uncle        adult onset   CAD Maternal Grandfather    Seizures Neg Hx     Past Medical History:  Diagnosis Date   Anxiety    Arrhythmia    COPD (chronic obstructive pulmonary disease) (HCC)    Depression    Deviated septum    Hx of migraines    Hx of suicide attempt 2010   overdose on Trileptal, has been on Seroquel in the past   Hyperlipidemia    Tobacco use     Past Surgical History:  Procedure Laterality Date   ABDOMINAL HYSTERECTOMY     AUGMENTATION  MAMMAPLASTY Bilateral    implants 30 years ago-saline   BREAST BIOPSY Right 08/19/2019   Affirm bx-"coil" clip-path pending   BREAST ENHANCEMENT SURGERY     CESAREAN SECTION     x 2   LEFT HEART CATH AND CORONARY ANGIOGRAPHY N/A 09/08/2020   Procedure: LEFT HEART CATH AND CORONARY ANGIOGRAPHY;  Surgeon: Lamar Blinks, MD;  Location: [facility] INVASIVE CV LAB;  Service: Cardiovascular;  Laterality: N/A;   SHOULDER SURGERY Left    impingement   TRANSUMBILICAL AUGMENTATION MAMMAPLASTY      Current Outpatient Medications  Medication Sig Dispense Refill   metoCLOPramide (REGLAN) 10 MG tablet Take 1 tablet (10 mg total) by mouth every 6 (six) hours as needed. Take at the onset of migraine as needed. 20 tablet 3   albuterol (VENTOLIN HFA) 108 (90 Base) MCG/ACT inhaler TAKE 2 PUFFS BY MOUTH EVERY 6 HOURS AS NEEDED FOR WHEEZE OR SHORTNESS OF BREATH (Patient taking differently: Inhale 2 puffs into the lungs See admin instructions. Inhale 2 puffs (scheduled)  twice daily & use  every 6 hours as needed for wheezing/shortness of breath.) 8.5 each 2   clonazePAM (KLONOPIN) 0.5 MG tablet Take 0.5 mg by mouth in the morning and at bedtime.     fluticasone (FLONASE) 50 MCG/ACT nasal spray SPRAY 2 SPRAYS INTO EACH NOSTRIL EVERY DAY (Patient taking differently: Place 2 sprays into both nostrils in the morning and at bedtime.) 16 mL 0   guanFACINE (INTUNIV) 1 MG TB24 ER tablet Take 1 mg by mouth in the morning.     montelukast (SINGULAIR) 10 MG tablet TAKE 1 TABLET BY MOUTH EVERYDAY AT BEDTIME (Patient taking differently: Take 10 mg by mouth at bedtime.) 30 tablet 5   nortriptyline (PAMELOR) 50 MG capsule Take 1 capsule (50 mg total) by mouth at bedtime. 30 capsule 6   Omega-3 1000 MG CAPS Take 1,000 mg by mouth in the morning and at bedtime.     sertraline (ZOLOFT) 100 MG tablet Take 2 tablets (200 mg total) by mouth at bedtime. 60 tablet 0   topiramate (TOPAMAX) 100 MG tablet Take 1 tablet (100  mg total) by  mouth 2 (two) times daily. 180 tablet 3   Turmeric 500 MG TABS Take 500 mg by mouth in the morning and at bedtime.     No current facility-administered medications for this visit.    Allergies as of 06/20/2021 - Review Complete 09/08/2020  Allergen Reaction Noted   Other  08/31/2020    Vitals: LMP  (LMP Unknown)  Last Weight:  Wt Readings from Last 1 Encounters:  09/08/20 136 lb (61.7 kg)   Last Height:   Ht Readings from Last 1 Encounters:  09/08/20 5\' 6"  (1.676 m)   Neuro: Detailed Neurologic Exam  Speech:    Speech is normal; fluent and spontaneous with normal comprehension.  Cognition:    The patient is oriented to person, place, and time;     recent and remote memory intact;     language fluent;     normal attention, concentration,     fund of knowledge     Assessment/Plan:  51 year old with chronic intractable headaches.  She also had a recent hospitalization and diagnosed with psychogenic pseudoseizures.  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 40 and she also saw Kieth Brightly. Migraines: restart Aimovig - sent her the application for [employer]gensociety, try reglan and iburporofen/tylenol (if not contraindicated) Continue Topiramate Increase Nortriptyline.  She has afib, rcommend follow up with primary care and cardiology as clinically warranted from cardiology Increae nortriptyline, unlikely seratonin syndrome but for any symptoms go to ed   Meds ordered this encounter  Medications   metoCLOPramide (REGLAN) 10 MG tablet    Sig: Take 1 tablet (10 mg total) by mouth every 6 (six) hours as needed. Take at the onset of migraine as needed.    Dispense:  20 tablet    Refill:  3   nortriptyline (PAMELOR) 50 MG capsule    Sig: Take 1 capsule (50 mg total) by mouth at bedtime.    Dispense:  30 capsule    Refill:  6    Hold until patient calls to fill    topiramate (TOPAMAX) 100 MG tablet    Sig: Take 1 tablet (100 mg total) by mouth 2 (two) times daily.    Dispense:  180 tablet    Refill:  3     PRIOR  I had a very long talk with patient and husband.  Initially they were quite upset because they were told "this is all in your head" per report, however I did talk to them about conversion disorder, that emotional problems can definitely present themselves physically, that it is not "all in her head" as she did have these episodes; the episodes are real, the cause is not from an abnormal electrical activity in the brain but I do think it is from stress, anxiety, worrying, she does see a psychiatrist, I recommended seeing a therapist.  I also gave her a book about nonepileptic and psychogenic pseudoseizures, I think that she understood and hopefully she will read the book and tell me what she thinks.  I did tell her however that we could do further testing, but she has not had any episodes since being on the Klonopin, I did review a video that she had which was not consistent with seizures (head extension, hyperventilating, shaking of limbs variably), not consistent with physiologic neurologic etiology.  I tried to reassure her that there  are many people who have nonepileptic, psychogenic. Will check MMA since B12 was 291.    PRIOR:  Doing excellent on botox. - Doing excellent on Botox alone. NOT ON CGRP. Continue botox, > 75% improvement in frequency and severity of migraines.  I will Not charge her, this could have been a nurse call she didn't need an appointment to ask if we can continue Botox.   To prevent or relieve headaches, try the following: Cool Compress. Lie down and place a cool compress on your head.  Avoid headache triggers. If certain foods or odors seem to have triggered your migraines in the past, avoid them. A headache diary might help you identify triggers.  Include physical activity in your daily routine. Try a daily walk or  other moderate aerobic exercise.  Manage stress. Find healthy ways to cope with the stressors, such as delegating tasks on your to-do list.  Practice relaxation techniques. Try deep breathing, yoga, massage and visualization.  Eat regularly. Eating regularly scheduled meals and maintaining a healthy diet might help prevent headaches. Also, drink plenty of fluids.  Follow a regular sleep schedule. Sleep deprivation might contribute to headaches Consider biofeedback. With this mind-body technique, you learn to control certain bodily functions -- such as muscle tension, heart rate and blood pressure -- to prevent headaches or reduce headache pain.    Proceed to emergency room if you experience new or worsening symptoms or symptoms do not resolve, if you have new neurologic symptoms or if headache is severe, or for any concerning symptom.   Provided education and documentation from American headache Society toolbox including articles on: chronic migraine medication overuse headache, chronic migraines, prevention of migraines, behavioral and other nonpharmacologic treatments for headache.    Cc: Mick Sell, MD  Naomie Dean, MD  [facility] [address] [place], [place] [address]  Phone [phone] Fax [phone]-  I spent 30 minutes of face-to-face and non-face-to-face time with patient on the  1. Chronic migraine without aura without status migrainosus, not intractable     diagnosis.  This included previsit chart review, lab review, study review, order entry, electronic health record documentation, patient education on the different diagnostic and therapeutic options, counseling and coordination of care, risks and benefits of management, compliance, or risk factor reduction

## 2021-06-20 ENCOUNTER — Encounter: Payer: Self-pay | Admitting: Neurology

## 2021-06-20 ENCOUNTER — Ambulatory Visit (INDEPENDENT_AMBULATORY_CARE_PROVIDER_SITE_OTHER): Payer: Self-pay | Admitting: Neurology

## 2021-06-20 DIAGNOSIS — G43709 Chronic migraine without aura, not intractable, without status migrainosus: Secondary | ICD-10-CM

## 2021-06-20 MED ORDER — TOPIRAMATE 100 MG PO TABS: 100.0000 mg | ORAL_TABLET | Freq: Two times a day (BID) | ORAL | 3 refills | Status: DC

## 2021-06-20 MED ORDER — METOCLOPRAMIDE HCL 10 MG PO TABS: 10.0000 mg | ORAL_TABLET | Freq: Four times a day (QID) | ORAL | 3 refills | Status: DC | PRN

## 2021-06-20 MED ORDER — NORTRIPTYLINE HCL 50 MG PO CAPS: 50.0000 mg | ORAL_CAPSULE | Freq: Every day | ORAL | 6 refills | Status: DC

## 2021-10-07 IMAGING — RF DG UGI W SINGLE CM
12 of 17 series · 14 of 24 positions shown · non-contrast
Comparison: CT chest 12/17/2018.

CLINICAL DATA: Dysphagia.  Intentional weight loss.

EXAM:
UPPER GI SERIES WITHOUT KUB
TECHNIQUE: Routine upper GI series was performed with high density and thin
barium.
FLUOROSCOPY TIME:  Fluoroscopy Time:  2 minutes 18 seconds
Radiation Exposure Index (if provided by the fluoroscopic device):
46.8 mGy

[Series 1: fluoro_barium 2fps_bw · 0.18mm/px · 2 of 7 frames shown (1 of 12)]
[frame 2/7]
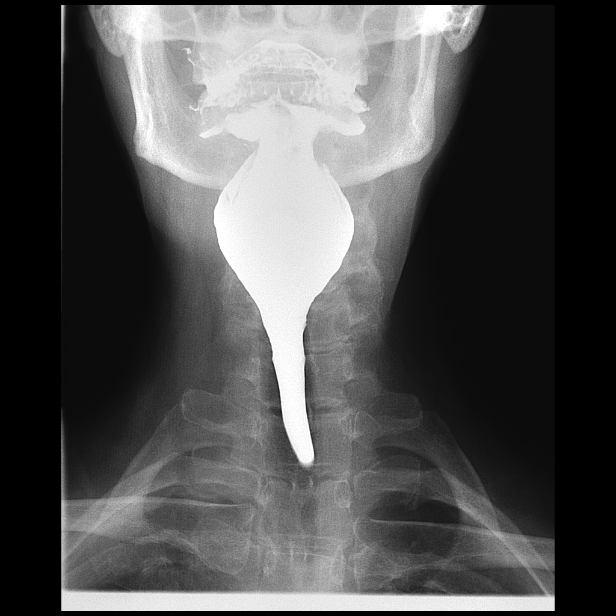
[frame 6/7]
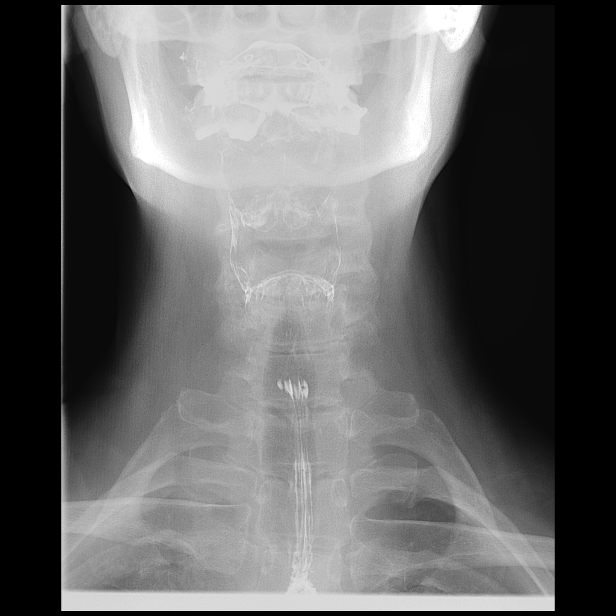

[Series 2: fluoro_barium 2fps_bw · 0.18mm/px · 1 of 6 frames shown (2 of 12)]
[frame 4/6]
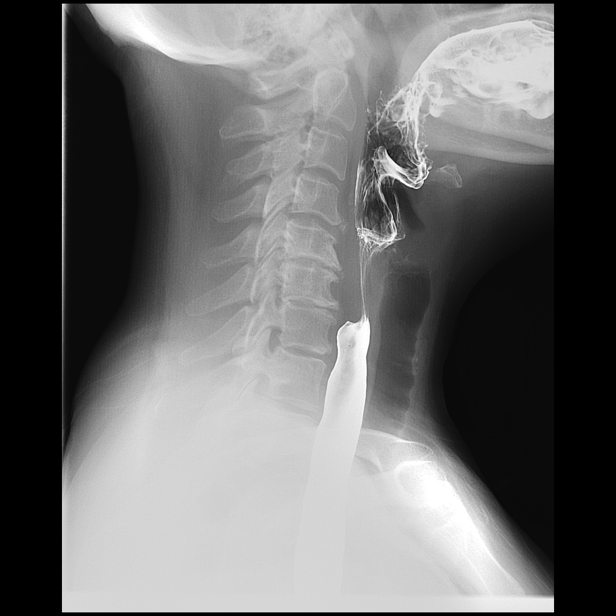

[Series 3: fluoro_barium 2fps_bw · 0.18mm/px · 2 of 2 frames shown (3 of 12)]
[frame 1/2]
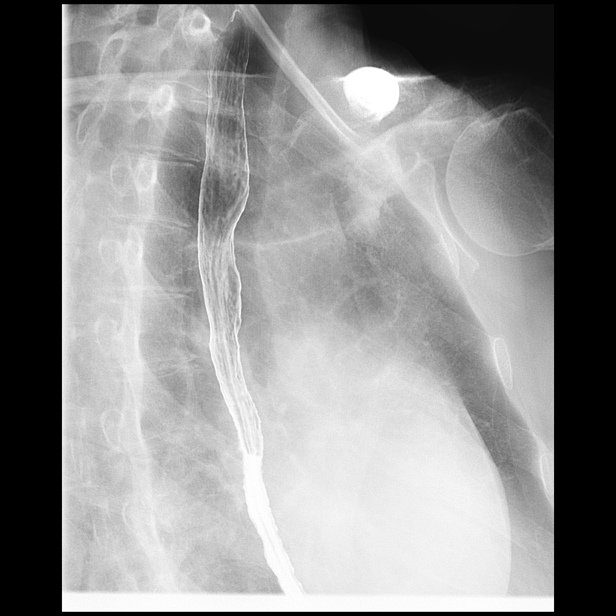
[frame 2/2]
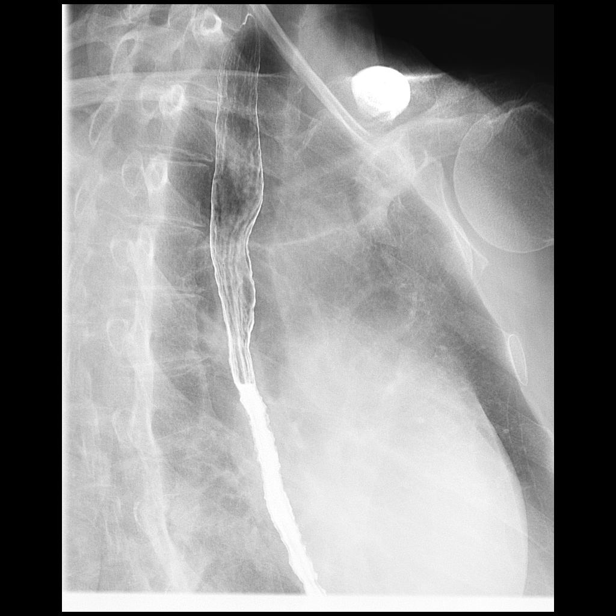

[Series 6: fluoro_barium 2fps_bw · 0.19mm/px · 1 of 1 slices shown (4 of 12)]
[im 1/1]
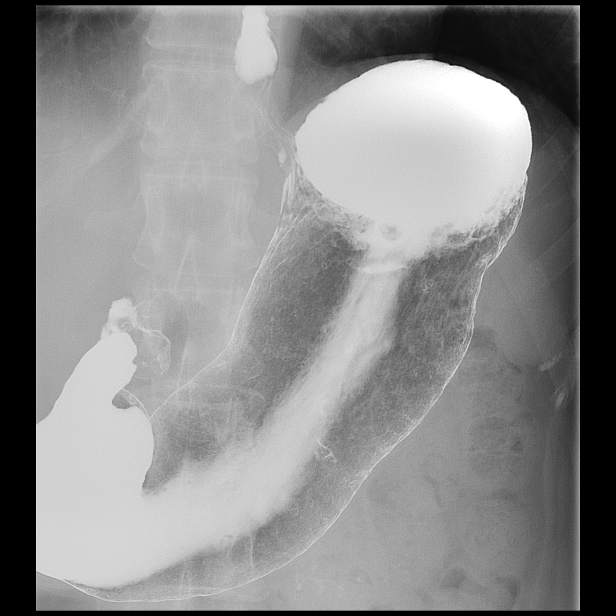

[Series 8: fluoro_barium 2fps_bw · 0.19mm/px · 1 of 1 slices shown (5 of 12)]
[im 1/1]
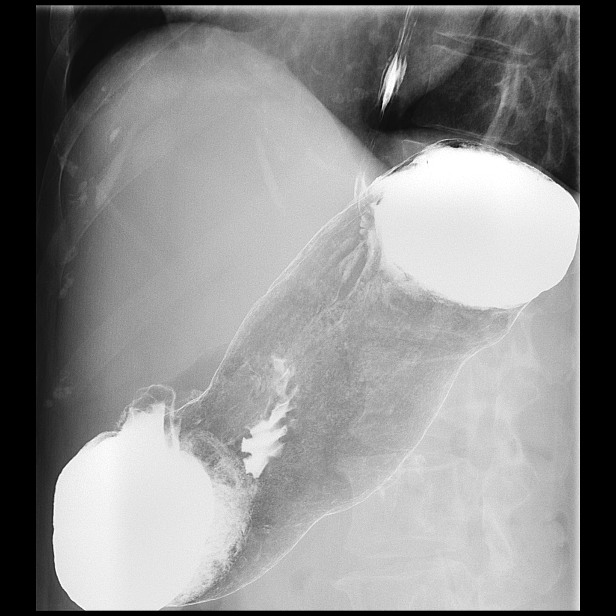

[Series 9: fluoro_barium 2fps_bw · 0.19mm/px · 1 of 1 slices shown (6 of 12)]
[im 1/1]
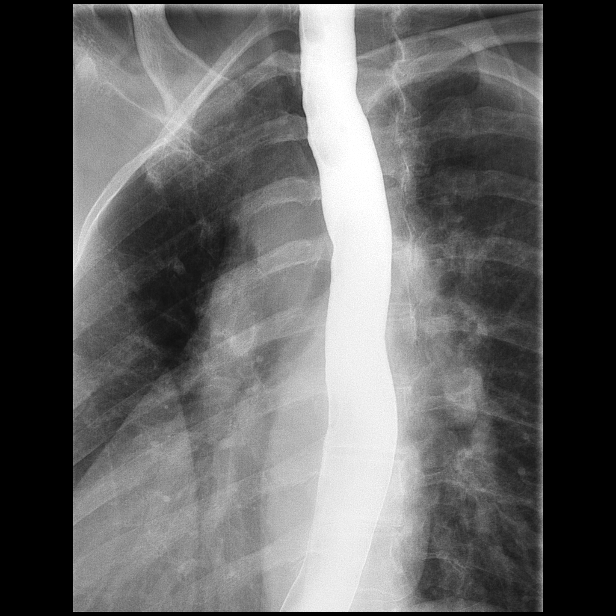

[Series 11: fluoro_barium 2fps_bw · 0.19mm/px · 1 of 1 slices shown (7 of 12)]
[im 1/1]
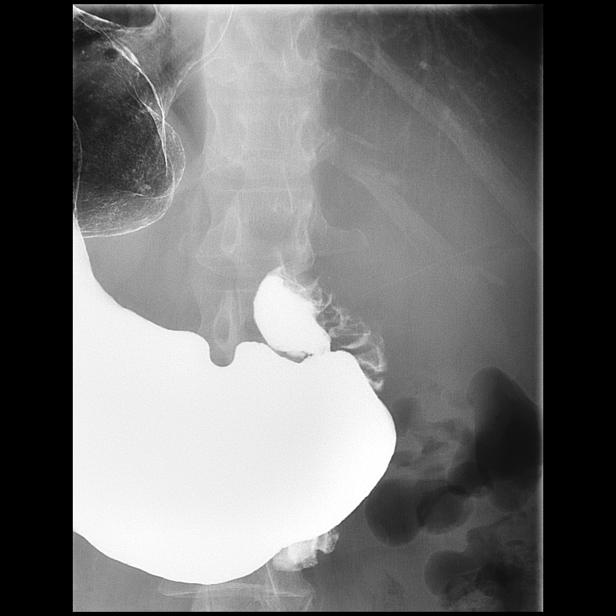

[Series 13: fluoro_barium 2fps_bw · 0.19mm/px · 1 of 2 frames shown (8 of 12)]
[frame 2/2]
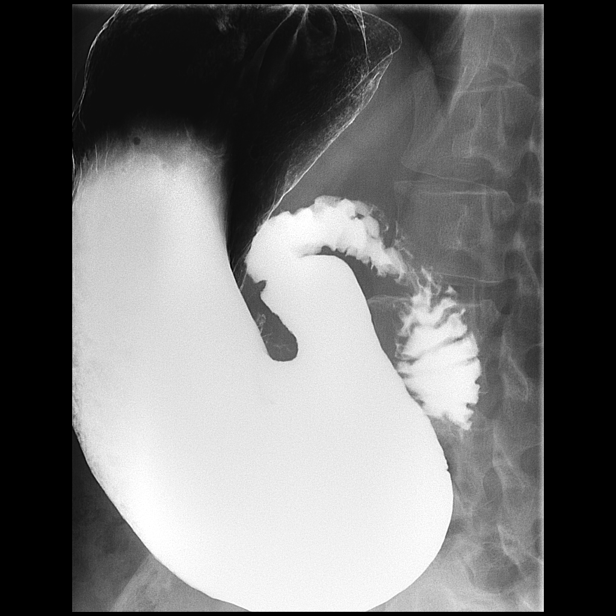

[Series 15: fluoro_barium 2fps_bw · 0.19mm/px · 1 of 1 slices shown (9 of 12)]
[im 1/1]
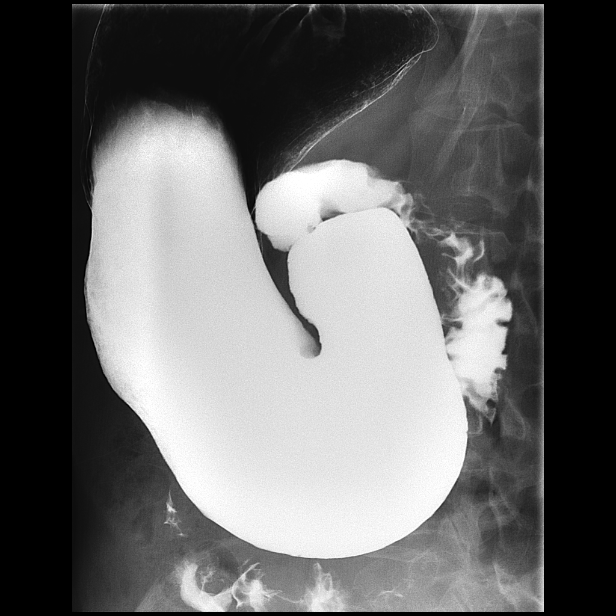

[Series 16: fluoro_barium 2fps_bw · 0.19mm/px · 1 of 2 frames shown (10 of 12)]
[frame 1/2]
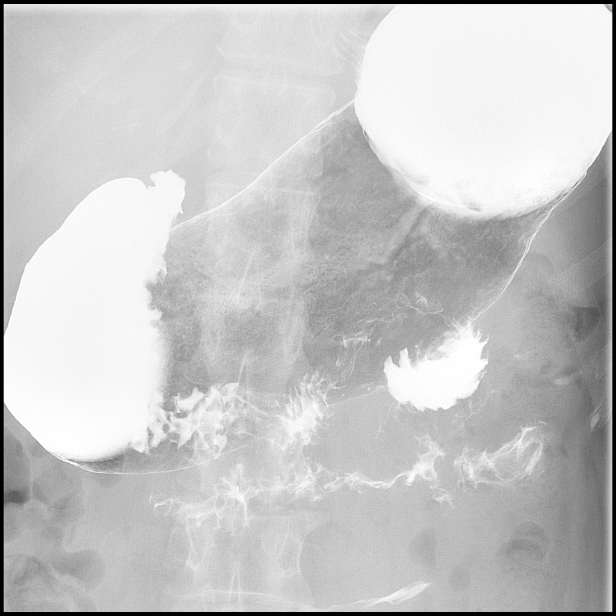

[Series 17: fluoro_barium 2fps_bw · 0.19mm/px · 1 of 2 frames shown (11 of 12)]
[frame 2/2]
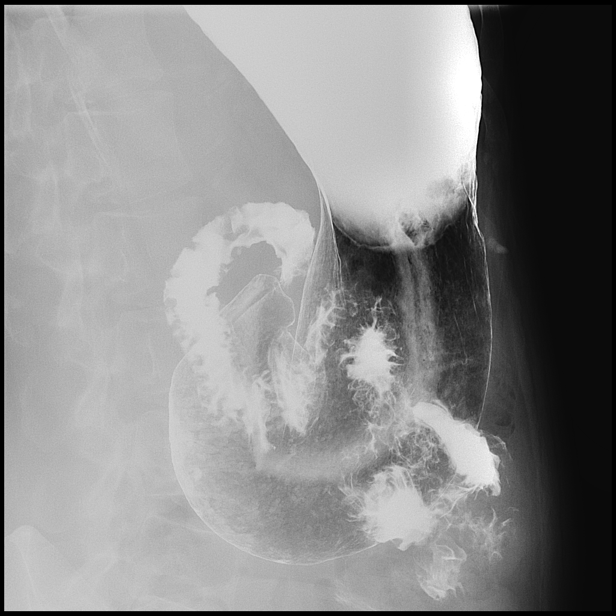

[Series 19: fluoro_barium 2fps_bw · 0.19mm/px · 1 of 1 slices shown (12 of 12)]
[im 1/1]
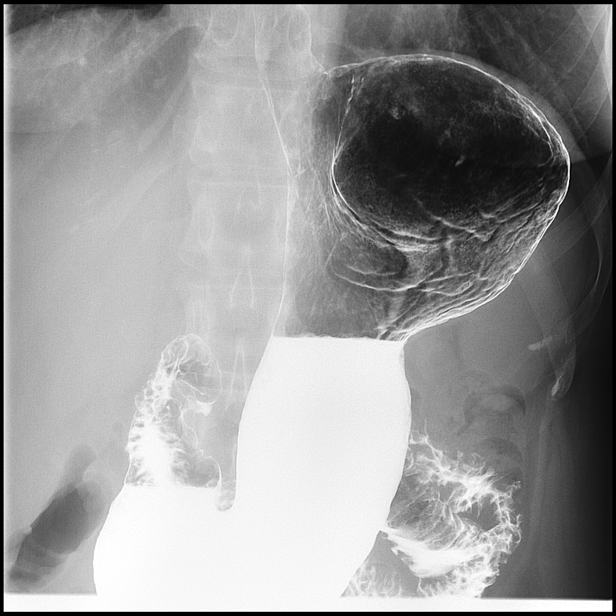

[14 of 24 positions shown; findings below may reference images not displayed]

FINDINGS: Cervical esophagus is widely patent. Mild deformity of the posterior
cervical esophagus noted secondary to prominent vertebral endplate
osteophytes. Thoracic esophagus is widely patent. No focal
esophageal lesions identified. Peristalsis normal. No reflux. Mild
debris noted the stomach. No focal fixed gastric abnormality
identified. Duodenal bulb appears normal. No evidence of ulceration.
C-loop appears normal.
IMPRESSION: 1. Mild deformity noted the posterior cervical esophagus secondary
to prominent vertebral endplate osteophytes. Esophagus is otherwise
unremarkable. No reflux.

2. Stomach and duodenum appear unremarkable. No evidence of
ulceration.

## 2021-10-30 ENCOUNTER — Other Ambulatory Visit: Payer: Self-pay | Admitting: Neurology

## 2022-01-09 DIAGNOSIS — Z0271 Encounter for disability determination: Secondary | ICD-10-CM

## 2022-04-10 ENCOUNTER — Encounter: Payer: Self-pay | Admitting: Neurology

## 2022-04-10 ENCOUNTER — Ambulatory Visit: Payer: Medicaid Other | Admitting: Neurology

## 2022-04-10 VITALS — BP 111/72 | HR 66 | Ht 67.0 in | Wt 159.0 lb

## 2022-04-10 DIAGNOSIS — E538 Deficiency of other specified B group vitamins: Secondary | ICD-10-CM

## 2022-04-10 DIAGNOSIS — R200 Anesthesia of skin: Secondary | ICD-10-CM

## 2022-04-10 DIAGNOSIS — R000 Tachycardia, unspecified: Secondary | ICD-10-CM

## 2022-04-10 DIAGNOSIS — H532 Diplopia: Secondary | ICD-10-CM

## 2022-04-10 DIAGNOSIS — R002 Palpitations: Secondary | ICD-10-CM

## 2022-04-10 DIAGNOSIS — H02401 Unspecified ptosis of right eyelid: Secondary | ICD-10-CM

## 2022-04-10 DIAGNOSIS — G43709 Chronic migraine without aura, not intractable, without status migrainosus: Secondary | ICD-10-CM | POA: Diagnosis not present

## 2022-04-10 DIAGNOSIS — R519 Headache, unspecified: Secondary | ICD-10-CM

## 2022-04-10 NOTE — Patient Instructions (Addendum)
MRI of the brain and cervical spine Bloodwork Start botox approval process Reglan as needed ( may be able to change to nurtec or ubrelvy if we can get her migraines improved) See you for botox  Metoclopramide Tablets What is this medication? METOCLOPRAMIDE (met oh kloe PRA mide) treats reflux disease. It is prescribed when other medications have not worked. It may also be used to treat slow emptying of the digestive tract. It works by helping the muscles in your digestive tract move food. This empties your digestive tract, which relieves symptoms such as fullness, nausea, and heartburn. This medicine may be used for other purposes; ask your health care provider or pharmacist if you have questions. COMMON BRAND NAME(S): Reglan What should I tell my care team before I take this medication? They need to know if you have any of these conditions: Breast cancer Depression Diabetes Frequently drink alcohol Heart failure High blood pressure Kidney disease Liver disease Parkinson's disease or a movement disorder Pheochromocytoma Seizures Stomach obstruction, bleeding, or perforation An unusual or allergic reaction to metoclopramide, procainamide, other medications, foods, dyes, or preservatives Pregnant or trying to get pregnant Breast-feeding How should I use this medication? Take this medication by mouth with a glass of water. Follow the directions on the prescription label. Take this medication on an empty stomach, about 30 minutes before eating. Take your doses at regular intervals. Do not take your medication more often than directed. Do not stop taking except on the advice of your care team. A special MedGuide will be given to you by the pharmacist with each prescription and refill. Be sure to read this information carefully each time. Talk to your care team about the use of this medication in children. Special care may be needed. Overdosage: If you think you have taken too much of this  medicine contact a poison control center or emergency room at once. NOTE: This medicine is only for you. Do not share this medicine with others. What if I miss a dose? If you miss a dose, skip it. Take your next dose at the normal time. Do not take extra or 2 doses at the same time to make up for the missed dose. What may interact with this medication? Alcohol Antihistamines for allergy, cough, and cold Atovaquone Atropine Bupropion Certain medications for anxiety or sleep Certain medications for bladder problems, such as oxybutynin, tolterodine Certain medications for depression or mental health conditions Certain medications for Parkinson's disease Certain medications for seizures, such as phenobarbital, primidone Certain medications for stomach problems, such as dicyclomine, hyoscyamine Certain medications for travel sickness, such as scopolamine Cyclosporine Digoxin Fosfomycin General anesthetics, such as halothane, isoflurane, methoxyflurane, propofol Insulin and other medications for diabetes Ipratropium MAOIs, such as Carbex, Eldepryl, Marplan, Nardil, and Parnate Medications that relax muscles for surgery Opioid medications for pain Paroxetine Phenothiazines, such as chlorpromazine, mesoridazine, prochlorperazine, thioridazine Posaconazole Quinidine Sirolimus Tacrolimus This list may not describe all possible interactions. Give your health care provider a list of all the medicines, herbs, non-prescription drugs, or dietary supplements you use. Also tell them if you smoke, drink alcohol, or use illegal drugs. Some items may interact with your medicine. What should I watch for while using this medication? It may take a few weeks for your stomach condition to start to get better. However, do not take this medication for longer than 12 weeks. The longer you take this medication, and the more you take it, the greater your chances are of developing serious side effects. If you  are  over 62 years of age, a female patient, or you have diabetes, you may be at an increased risk for side effects from this medication. Contact your care team immediately if you start having movements you cannot control such as lip smacking, rapid movements of the tongue, involuntary or uncontrollable movements of the eyes, head, arms and legs, or muscle twitches and spasms. Patients and their families should watch out for worsening depression or thoughts of suicide. Also watch out for any sudden or severe changes in feelings such as feeling anxious, agitated, panicky, irritable, hostile, aggressive, impulsive, severely restless, overly excited and hyperactive, or not being able to sleep. If this happens, especially at the beginning of treatment or after a change in dose, call your care team. Do not treat yourself for high fever. Ask your care team for advice. You may get drowsy or dizzy. Do not drive, use machinery, or do anything that needs mental alertness until you know how this medication affects you. Do not stand or sit up quickly, especially if you are over 70 years of age. This reduces the risk of dizzy or fainting spells. Alcohol can make you more drowsy and dizzy. Avoid alcoholic drinks. What side effects may I notice from receiving this medication? Side effects that you should report to your care team as soon as possible: Allergic reactions--skin rash, itching, hives, swelling of the face, lips, tongue, or throat High fever, stiff muscles, increased sweating, fast or irregular heartbeat, and confusion, which may be signs of neuroleptic malignant syndrome High prolactin level--unexpected breast tissue growth, discharge from the nipple, change in sex drive or performance, irregular menstrual cycle Increase in blood pressure Swelling of the ankles, hands, or feet Thoughts of suicide or self-harm, worsening mood, feelings of depression Uncontrolled and repetitive body movements, muscle stiffness or  spasms, tremors or shaking, loss of balance or coordination, restlessness, shuffling walk, which may be signs of extrapyramidal symptoms (EPS) Side effects that usually do not require medical attention (report to your care team if they continue or are bothersome): Dizziness Drowsiness Fatigue Headache Trouble sleeping This list may not describe all possible side effects. Call your doctor for medical advice about side effects. You may report side effects to FDA at 1-800-FDA-1088. Where should I keep my medication? Keep out of the reach of children and pets. Store at room temperature between 20 and 25 degrees C (68 and 77 degrees F). Protect from light. Keep container tightly closed. Throw away any unused medication after the expiration date. NOTE: This sheet is a summary. It may not cover all possible information. If you have questions about this medicine, talk to your doctor, pharmacist, or health care provider.  2023 [employer]/Gold Standard (2020-12-07 00:00:00) OnabotulinumtoxinA Injection (Medical Use) What is this medication? ONABOTULINUMTOXINA (o na BOTT you lye num tox in eh) treats severe muscle spasms. It may also be used to prevent migraine headaches. It can treat excessive sweating when other medications do not work well enough. This medicine may be used for other purposes; ask your health care provider or pharmacist if you have questions. COMMON BRAND NAME(S): Botox What should I tell my care team before I take this medication? They need to know if you have any of these conditions: Breathing problems Cerebral palsy spasms Difficulty urinating Heart problems History of surgery where this medication is going to be used Infection at the site where this medication is going to be used Myasthenia gravis or other neurologic disease Nerve or muscle disease Surgery plans  Take medications that treat or prevent blood clots Thyroid problems An unusual or allergic reaction to botulinum  toxin, albumin, other medications, foods, dyes, or preservatives Pregnant or trying to get pregnant Breast-feeding How should I use this medication? This medication is for injected into a muscle. It is given by your care team in a hospital or clinic setting. A special MedGuide will be given to you before each treatment. Be sure to read this information carefully each time. Talk to your care team about the use of this medication in children. While this medication may be prescribed for children as young as 2 years for selected conditions, precautions do apply. Overdosage: If you think you have taken too much of this medicine contact a poison control center or emergency room at once. NOTE: This medicine is only for you. Do not share this medicine with others. What if I miss a dose? This does not apply. What may interact with this medication? Aminoglycoside antibiotics, such as gentamicin, neomycin, tobramycin Muscle relaxants Other botulinum toxin injections This list may not describe all possible interactions. Give your health care provider a list of all the medicines, herbs, non-prescription drugs, or dietary supplements you use. Also tell them if you smoke, drink alcohol, or use illegal drugs. Some items may interact with your medicine. What should I watch for while using this medication? Visit your care team for regular check ups. This medication will cause weakness in the muscle where it is injected. Tell your care team if you feel unusually weak in other muscles. Get medical help right away if you have problems with breathing, swallowing, or talking. This medication might make your eyelids droop or make you see blurry or double. If you have weak muscles or trouble seeing do not drive a car, use machinery, or do other dangerous activities. This medication contains albumin from human blood. It may be possible to pass an infection in this medication, but no cases have been reported. Talk to your  care team about the risks and benefits of this medication. If your activities have been limited by your condition, go back to your regular routine slowly after treatment with this medication. What side effects may I notice from receiving this medication? Side effects that you should report to your care team as soon as possible: Allergic reactions--skin rash, itching, hives, swelling of the face, lips, tongue, or throat Dryness or irritation of the eyes, eye pain, change in vision, sensitivity to light Infection--fever, chills, cough, sore throat, wounds that don't heal, pain or trouble when passing urine, general feeling of discomfort or being unwell Spread of botulinum toxin effects--unusual weakness or fatigue, blurry or double vision, trouble swallowing, hoarseness or trouble speaking, trouble breathing, loss of bladder control Trouble passing urine Side effects that usually do not require medical attention (report these to your care team if they continue or are bothersome): Dry mouth Eyelid drooping Fatigue Headache Pain, redness, or irritation at injection site This list may not describe all possible side effects. Call your doctor for medical advice about side effects. You may report side effects to FDA at 1-800-FDA-1088. Where should I keep my medication? This medication is given in a hospital or clinic and will not be stored at home. NOTE: This sheet is a summary. It may not cover all possible information. If you have questions about this medicine, talk to your doctor, pharmacist, or health care provider.  2023 [employer]/Gold Standard (2021-02-13 00:00:00)
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
## 2022-04-11 ENCOUNTER — Telehealth: Payer: Self-pay | Admitting: Neurology

## 2022-04-11 NOTE — Telephone Encounter (Signed)
[facility]: [id number] exp. 04/11/2022 - 06/09/2022 sent to GI [phone]

## 2022-04-12 ENCOUNTER — Encounter: Payer: Self-pay | Admitting: Neurology

## 2022-04-12 LAB — CBC WITH DIFFERENTIAL/PLATELET
Basophils Absolute: 0.1 10*3/uL (ref 0.0–0.2)
Basos: 1 %
EOS (ABSOLUTE): 0.5 10*3/uL — ABNORMAL HIGH (ref 0.0–0.4)
Eos: 5 %
Hematocrit: 40.7 % (ref 34.0–46.6)
Hemoglobin: 13 g/dL (ref 11.1–15.9)
Immature Grans (Abs): 0 10*3/uL (ref 0.0–0.1)
Immature Granulocytes: 0 %
Lymphocytes Absolute: 4.2 10*3/uL — ABNORMAL HIGH (ref 0.7–3.1)
Lymphs: 44 %
MCH: 30 pg (ref 26.6–33.0)
MCHC: 31.9 g/dL (ref 31.5–35.7)
MCV: 94 fL (ref 79–97)
Monocytes Absolute: 0.7 10*3/uL (ref 0.1–0.9)
Monocytes: 7 %
Neutrophils Absolute: 4.2 10*3/uL (ref 1.4–7.0)
Neutrophils: 43 %
Platelets: 249 10*3/uL (ref 150–450)
RBC: 4.33 x10E6/uL (ref 3.77–5.28)
RDW: 11.8 % (ref 11.7–15.4)
WBC: 9.7 10*3/uL (ref 3.4–10.8)

## 2022-04-12 LAB — COMPREHENSIVE METABOLIC PANEL
ALT: 13 IU/L (ref 0–32)
AST: 15 IU/L (ref 0–40)
Albumin/Globulin Ratio: 1.8 (ref 1.2–2.2)
Albumin: 4.5 g/dL (ref 3.8–4.9)
Alkaline Phosphatase: 80 IU/L (ref 44–121)
BUN/Creatinine Ratio: 20 (ref 9–23)
BUN: 16 mg/dL (ref 6–24)
Bilirubin Total: 0.2 mg/dL (ref 0.0–1.2)
CO2: 20 mmol/L (ref 20–29)
Calcium: 9.6 mg/dL (ref 8.7–10.2)
Chloride: 111 mmol/L — ABNORMAL HIGH (ref 96–106)
Creatinine, Ser: 0.8 mg/dL (ref 0.57–1.00)
Globulin, Total: 2.5 g/dL (ref 1.5–4.5)
Glucose: 92 mg/dL (ref 70–99)
Potassium: 4.6 mmol/L (ref 3.5–5.2)
Sodium: 147 mmol/L — ABNORMAL HIGH (ref 134–144)
Total Protein: 7 g/dL (ref 6.0–8.5)
eGFR: 88 mL/min/{1.73_m2} (ref >59)

## 2022-04-12 LAB — METHYLMALONIC ACID, SERUM: Methylmalonic Acid: 285 nmol/L (ref 0–378)

## 2022-04-12 LAB — B12 AND FOLATE PANEL
Folate: 10.1 ng/mL (ref >3.0)
Vitamin B-12: 475 pg/mL (ref 232–1245)

## 2022-04-12 LAB — TSH RFX ON ABNORMAL TO FREE T4: TSH: 1.11 u[IU]/mL (ref 0.450–4.500)

## 2022-04-17 NOTE — Telephone Encounter (Signed)
EEG 72 hour for seizures ambulatory order per Dr. Jaynee Eagles.  [facility] form completed.  To be signed by [facility] then will fax.

## 2022-04-20 ENCOUNTER — Ambulatory Visit: Payer: Medicaid Other | Attending: Neurology

## 2022-04-20 DIAGNOSIS — R002 Palpitations: Secondary | ICD-10-CM

## 2022-04-20 DIAGNOSIS — R000 Tachycardia, unspecified: Secondary | ICD-10-CM | POA: Diagnosis not present

## 2022-04-26 ENCOUNTER — Telehealth: Payer: Self-pay | Admitting: Neurology

## 2022-04-26 NOTE — Telephone Encounter (Signed)
Can we start a reapproval for botox for ptient please?She was on botox for chronic migraines, was doing excellent on botox. - Doing excellent on Botox alone. NOT ON CGRP. Restart botox, > 75% improvement in frequency and severity of migraines.

## 2022-04-26 NOTE — Telephone Encounter (Signed)
Chronic Migraine CPT 64615  Botox J0585 Units:200  G43.709 Chronic Migraine without aura, not intractable, without status migrainous   

## 2022-04-29 ENCOUNTER — Ambulatory Visit
Admission: RE | Admit: 2022-04-29 | Discharge: 2022-04-29 | Disposition: A | Discharge status: Home / Self Care | Payer: Medicaid Other | Source: Ambulatory Visit | Attending: Neurology | Admitting: Neurology

## 2022-04-29 DIAGNOSIS — R200 Anesthesia of skin: Secondary | ICD-10-CM

## 2022-04-29 DIAGNOSIS — H532 Diplopia: Secondary | ICD-10-CM

## 2022-04-29 DIAGNOSIS — R519 Headache, unspecified: Secondary | ICD-10-CM

## 2022-04-29 DIAGNOSIS — E538 Deficiency of other specified B group vitamins: Secondary | ICD-10-CM

## 2022-04-29 DIAGNOSIS — H02401 Unspecified ptosis of right eyelid: Secondary | ICD-10-CM

## 2022-04-29 MED ORDER — GADOPICLENOL 0.5 MMOL/ML IV SOLN
7.5000 mL | Freq: Once | INTRAVENOUS | Status: AC | PRN
  Administered 2022-04-29: 7.5 mL via INTRAVENOUS

## 2022-04-30 ENCOUNTER — Encounter: Payer: Self-pay | Admitting: Neurology

## 2022-04-30 NOTE — Telephone Encounter (Signed)
Received fax from [facility])  approval 04-19-2022 for the EEG and VEEG   Code 1 [id number], , [phone], [id number] (approval dates 04-19-2022 thru 09-18-2022).  Fax confirmation received 04-27-2022 to [place] [phone].

## 2022-05-18 ENCOUNTER — Other Ambulatory Visit (HOSPITAL_COMMUNITY): Payer: Self-pay

## 2022-05-18 NOTE — Telephone Encounter (Signed)
Pharmacy Patient Advocate Encounter  Prior Authorization for Botox 200UNIT solution has been approved.    PA# [id number]  Effective dates: 05/18/2022 through 11/14/2022 PT copay is $4.00 This can be filled with [facility]

## 2022-05-21 MED ORDER — ONABOTULINUMTOXINA 200 UNITS IJ SOLR: INTRAMUSCULAR | 3 refills | Status: DC

## 2022-05-21 NOTE — Telephone Encounter (Signed)
Botox Rx set to [facility] outpatient pharmacy.

## 2022-05-21 NOTE — Telephone Encounter (Signed)
Scheduled for 06/04/22 with Judson Roch, sent message to pt with appt date and explained why she is seeing NP for Botox.

## 2022-05-21 NOTE — Addendum Note (Signed)
Addended by: Gildardo Griffes on: 05/21/2022 11:49 AM   Modules accepted: Orders

## 2022-05-30 DIAGNOSIS — R569 Unspecified convulsions: Secondary | ICD-10-CM

## 2022-06-04 ENCOUNTER — Other Ambulatory Visit: Payer: Self-pay

## 2022-06-04 ENCOUNTER — Other Ambulatory Visit (HOSPITAL_COMMUNITY): Payer: Self-pay

## 2022-06-04 ENCOUNTER — Ambulatory Visit: Payer: Medicaid Other | Admitting: Neurology

## 2022-06-04 ENCOUNTER — Other Ambulatory Visit: Payer: Self-pay | Admitting: *Deleted

## 2022-06-04 VITALS — BP 106/68

## 2022-06-04 DIAGNOSIS — G43709 Chronic migraine without aura, not intractable, without status migrainosus: Secondary | ICD-10-CM

## 2022-06-04 MED ORDER — ONABOTULINUMTOXINA 200 UNITS IJ SOLR
INTRAMUSCULAR | 3 refills | Status: DC
  Filled 2022-06-04: qty 1, fill #0
  Filled 2022-06-05: qty 1, 84d supply, fill #0
  Filled 2022-08-27: qty 1, 84d supply, fill #1
  Filled 2022-11-12: qty 1, 84d supply, fill #2
  Filled 2023-03-08: qty 1, 84d supply, fill #3

## 2022-06-04 MED ORDER — ONABOTULINUMTOXINA 200 UNITS IJ SOLR
200.0000 [IU] | Freq: Once | INTRAMUSCULAR | Status: AC
  Administered 2022-06-04: 200 [IU] via INTRAMUSCULAR

## 2022-06-04 MED ORDER — AIMOVIG 140 MG/ML ~~LOC~~ SOAJ: 1.1200 mL | SUBCUTANEOUS | 1 refills | Status: DC

## 2022-06-04 NOTE — Progress Notes (Signed)
   BOTOX PROCEDURE NOTE FOR MIGRAINE HEADACHE  HISTORY: Nancy Porter here for her first injection for chronic migraine headache. Here with her husband.  Last Botox in 2020? Insurance denied. Saw Dr. Jaynee Eagles, 04/10/22 given Aimovig injection, has done 2 injections so far. Right now 3 a week migraines, depending on stress her elderly mother lives with them.  Ready to restart Botox.  Description of procedure:  The patient was placed in a sitting position. The standard protocol was used for Botox as follows, with 5 units of Botox injected at each site:   -Procerus muscle, midline injection  -Corrugator muscle, bilateral injection  -Frontalis muscle, bilateral injection, with 2 sites each side, medial injection was performed in the upper one third of the frontalis muscle, in the region vertical from the medial inferior edge of the superior orbital rim. The lateral injection was again in the upper one third of the forehead vertically above the lateral limbus of the cornea, 1.5 cm lateral to the medial injection site.  -Temporalis muscle injection, 4 sites, bilaterally. The first injection was 3 cm above the tragus of the ear, second injection site was 1.5 cm to 3 cm up from the first injection site in line with the tragus of the ear. The third injection site was 1.5-3 cm forward between the first 2 injection sites. The fourth injection site was 1.5 cm posterior to the second injection site.  -Occipitalis muscle injection, 3 sites, bilaterally. The first injection was done one half way between the occipital protuberance and the tip of the mastoid process behind the ear. The second injection site was done lateral and superior to the first, 1 fingerbreadth from the first injection. The third injection site was 1 fingerbreadth superiorly and medially from the first injection site.  -Cervical paraspinal muscle injection, 2 sites, bilateral, the first injection site was 1 cm from the midline of the  cervical spine, 3 cm inferior to the lower border of the occipital protuberance. The second injection site was 1.5 cm superiorly and laterally to the first injection site.  -Trapezius muscle injection was performed at 3 sites, bilaterally. The first injection site was in the upper trapezius muscle halfway between the inflection point of the neck, and the acromion. The second injection site was one half way between the acromion and the first injection site. The third injection was done between the first injection site and the inflection point of the neck.  I did injection 5 units into each masseters, as Dr. Jaynee Eagles has done before.   A 200 unit bottle of Botox was used as above, the rest was wasted. The patient tolerated the procedure well, there were no complications of the above procedure.  Botox NDC [id number] Lot number [id number] Expiration date 09/2024 SP  I gave her # 2 Aimovig 140 mg injection samples. At next visit we can discuss if we will continue.

## 2022-06-04 NOTE — Progress Notes (Signed)
Botox 200 units x 1 vial Ndc-0023-3921-02 [id number]/2024 SP

## 2022-06-05 ENCOUNTER — Other Ambulatory Visit: Payer: Self-pay

## 2022-06-05 ENCOUNTER — Other Ambulatory Visit (HOSPITAL_COMMUNITY): Payer: Self-pay

## 2022-06-07 ENCOUNTER — Encounter: Payer: Self-pay | Admitting: Neurology

## 2022-06-07 ENCOUNTER — Other Ambulatory Visit: Payer: Self-pay | Admitting: Neurology

## 2022-06-07 DIAGNOSIS — F445 Conversion disorder with seizures or convulsions: Secondary | ICD-10-CM

## 2022-06-07 NOTE — Procedures (Signed)
Clinical History: This is a 52 y/o F who presents for follow-up for migraines. Patient was diagnosed with non-epileptic seizures. She states her migraines have changed and she now has a droopy right eye lid and double vision.  INTERMITTENT MONITORING with VIDEO TECHNICAL SUMMARY: This AVEEG was performed using equipment provided by [employer] utilizing Bluetooth ( Trackit ) amplifiers with continuous EEGT attended video collection using encrypted remote transmission via Echo secured cellular tower network with data rates for each AVEEG performed. This is a Biomedical engineer AVEEG, obtained, according to the 10-20 international electrode placement system, reformatted digitally into referential and bipolar montages. Data was acquired with a minimum of 21 bipolar connections and sampled at a minimum rate of 250 cycles per second per channel, maximum rate of 450 cycles per second per channel and two channels for EKG. The entire VEEG study was recorded through cable and or radio telemetry for subsequent analysis. Specified epochs of the AVEEG data were identified at the direction of the subject by the depression of a push button by the patient. Each patients event file included data acquired two minutes prior to the push button activation and continuing until two minutes afterwards. AVEEG files were reviewed on [place], Licensed Software provided by [employer] with a digital high frequency filter set at 70 Hz and a low frequency filter set at 1 Hz with a paper speed of 73m/s resulting in 10 seconds per digital page. This entire AVEEG was reviewed by the EEG Technologist. Random time samples, random sleep samples, clips, patient initiated push button files with included patient daily diary logs, EEG Technologist pruned data was reviewed and verified for accuracy and validity by the governing reading neurologist in full details. This AEEGV was fully compliant with all  requirements for CPT 97500 for setup, patient education, take down and administered by an EEG technologist. Long-Term EEG with Video was monitored intermittently by a qualified EEG technologist for the entirety of the recording; quality check-ins were performed at a minimum of every two hours, checking and documenting real-time data and video to assure the integrity and quality of the recording (e.g., camera position, electrode integrity and impedance), and identify the need for maintenance. For intermittent monitoring, an EEG Technologist monitored no more than 12 patients concurrently. Diagnostic video was captured at least 80% of the time during the recording.  PATIENT EVENTS: There were no patient events noted or captured during this recording.  TECHNOLOGIST EVENTS: No clear epileptiform activity was detected by the reviewing neurodiagnostic technologist during the recording for further evaluation.  TIME SAMPLES: 10-minutes of every 2 hours recorded are reviewed as random time samples.  SLEEP SAMPLES: 5-minutes of every 24 hours recorded are reviewed as random sleep samples.  AWAKE: At maximal level of alertness, the posterior dominant background activity was continuous, reactive, low voltage rhythm of 9 Hz. This was symmetric, well-modulated, and attenuated with eye opening. Diffuse, symmetric, frontocentral beta range activity was present.  SLEEP: N1 Sleep (Stage 1) was observed and characterized by the disappearance of alpha rhythm and the appearance of vertex activity. N2 Sleep (Stage 2) was observed and characterized by vertex waves, K-complexes, and sleep spindles. N3 (Stage 3) sleep was observed and characterized by high amplitude Delta activity of 20%. REM sleep was observed.  EKG: There were no arrhythmias or abnormalities noted during this recording.  Impression: This is a normal 72 hrs video ambulatory EEG. There were no seizures and no epileptiform discharges. Normal  EEGs, however does  not rule out epilepsy.    Alric Ran, MD [facility]

## 2022-06-18 ENCOUNTER — Telehealth: Payer: Medicaid Other | Admitting: Neurology

## 2022-06-29 ENCOUNTER — Other Ambulatory Visit: Payer: Self-pay | Admitting: Infectious Diseases

## 2022-06-29 DIAGNOSIS — N6489 Other specified disorders of breast: Secondary | ICD-10-CM

## 2022-07-23 ENCOUNTER — Inpatient Hospital Stay: Admission: RE | Admit: 2022-07-23 | Payer: Medicaid Other | Source: Ambulatory Visit

## 2022-07-23 ENCOUNTER — Other Ambulatory Visit: Payer: Medicaid Other

## 2022-07-26 ENCOUNTER — Other Ambulatory Visit: Payer: Medicaid Other

## 2022-08-21 ENCOUNTER — Other Ambulatory Visit (HOSPITAL_COMMUNITY): Payer: Self-pay

## 2022-08-24 ENCOUNTER — Other Ambulatory Visit (HOSPITAL_COMMUNITY): Payer: Self-pay

## 2022-08-27 ENCOUNTER — Other Ambulatory Visit (HOSPITAL_COMMUNITY): Payer: Self-pay

## 2022-08-27 ENCOUNTER — Other Ambulatory Visit: Payer: Self-pay

## 2022-08-29 ENCOUNTER — Ambulatory Visit: Payer: Medicaid Other | Admitting: Neurology

## 2022-08-29 ENCOUNTER — Telehealth: Payer: Self-pay | Admitting: Pharmacy Technician

## 2022-08-29 ENCOUNTER — Telehealth: Payer: Self-pay | Admitting: Neurology

## 2022-08-29 ENCOUNTER — Encounter: Payer: Self-pay | Admitting: Neurology

## 2022-08-29 ENCOUNTER — Other Ambulatory Visit (HOSPITAL_COMMUNITY): Payer: Self-pay

## 2022-08-29 VITALS — Ht 67.0 in | Wt 157.0 lb

## 2022-08-29 DIAGNOSIS — G43709 Chronic migraine without aura, not intractable, without status migrainosus: Secondary | ICD-10-CM

## 2022-08-29 MED ORDER — AIMOVIG 140 MG/ML ~~LOC~~ SOAJ: 1.1200 mL | SUBCUTANEOUS | 2 refills | Status: DC

## 2022-08-29 MED ORDER — NORTRIPTYLINE HCL 50 MG PO CAPS: 50.0000 mg | ORAL_CAPSULE | Freq: Every day | ORAL | 3 refills | Status: DC

## 2022-08-29 MED ORDER — TOPIRAMATE 100 MG PO TABS: 100.0000 mg | ORAL_TABLET | Freq: Two times a day (BID) | ORAL | 3 refills | Status: DC

## 2022-08-29 MED ORDER — METOCLOPRAMIDE HCL 10 MG PO TABS: 10.0000 mg | ORAL_TABLET | Freq: Four times a day (QID) | ORAL | 3 refills | Status: DC | PRN

## 2022-08-29 MED ORDER — UBRELVY 100 MG PO TABS: 100.0000 mg | ORAL_TABLET | ORAL | 11 refills | Status: DC | PRN

## 2022-08-29 MED ORDER — ONABOTULINUMTOXINA 200 UNITS IJ SOLR
155.0000 [IU] | Freq: Once | INTRAMUSCULAR | Status: AC
  Administered 2022-08-29: 165 [IU] via INTRAMUSCULAR

## 2022-08-29 NOTE — Telephone Encounter (Signed)
LVM and sent mychart msg informing pt of appt made with Dr. Lucia Gaskins per Maralyn Sago

## 2022-08-29 NOTE — Progress Notes (Signed)
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

## 2022-08-29 NOTE — Progress Notes (Signed)
   BOTOX PROCEDURE NOTE FOR MIGRAINE HEADACHE  HISTORY: Referred here for second Botox injection, I last did her Botox 06/04/2022. Has been getting samples from Dr. Lucia Gaskins for Aimovig 140. Insurance won't do both Botox and Aimovig. She didn't have the Aimovig last month. Prior to Botox having 3 migraines a week, now having 1-2 a week, depends on the weather.  Is very pleased with combination of Botox and Aimovig.  Also takes nortriptyline for lightening bolt sensation, burnt smells. Takes Topamax 100 mg BID. Uses Reglan for migraine rescue. Can't do any triptans, gives terrible rebound.  She has liked Ubrelvy, wishes to have a refill. Has been at least 75% helpful.   Description of procedure:  The patient was placed in a sitting position. The standard protocol was used for Botox as follows, with 5 units of Botox injected at each site:   -Procerus muscle, midline injection  -Corrugator muscle, bilateral injection  -Frontalis muscle, bilateral injection, with 2 sites each side, medial injection was performed in the upper one third of the frontalis muscle, in the region vertical from the medial inferior edge of the superior orbital rim. The lateral injection was again in the upper one third of the forehead vertically above the lateral limbus of the cornea, 1.5 cm lateral to the medial injection site.  -Temporalis muscle injection, 4 sites, bilaterally. The first injection was 3 cm above the tragus of the ear, second injection site was 1.5 cm to 3 cm up from the first injection site in line with the tragus of the ear. The third injection site was 1.5-3 cm forward between the first 2 injection sites. The fourth injection site was 1.5 cm posterior to the second injection site.  -Occipitalis muscle injection, 3 sites, bilaterally. The first injection was done one half way between the occipital protuberance and the tip of the mastoid process behind the ear. The second injection site was done lateral and  superior to the first, 1 fingerbreadth from the first injection. The third injection site was 1 fingerbreadth superiorly and medially from the first injection site.  -Cervical paraspinal muscle injection, 2 sites, bilateral, the first injection site was 1 cm from the midline of the cervical spine, 3 cm inferior to the lower border of the occipital protuberance. The second injection site was 1.5 cm superiorly and laterally to the first injection site.  -Trapezius muscle injection was performed at 3 sites, bilaterally. The first injection site was in the upper trapezius muscle halfway between the inflection point of the neck, and the acromion. The second injection site was one half way between the acromion and the first injection site. The third injection was done between the first injection site and the inflection point of the neck.  I did 5 units to right and left masseter.   A 200 unit bottle of Botox was used, 165 units were injected, the rest of the Botox was wasted. The patient tolerated the procedure well, there were no complications of the above procedure.  Botox NDC 4098-1191-47 Lot number [id number] Expiration date 09/2024 SP  I gave her # 3 boxes of Aimovig 140 mg monthly injection.  I also sent in Amagon as a rescue.  I refilled her prescriptions including Reglan to use as needed during acute migraine.

## 2022-08-29 NOTE — Telephone Encounter (Signed)
Patient Advocate Encounter  Prior Authorization for Bernita Raisin 100MG  tablets has been approved.    PA# [id number] Key: [id number] Insurance [employer] [place] Medicaid Electronic [place] Form Effective dates: 08/29/2022 through 08/29/2023

## 2022-09-11 ENCOUNTER — Ambulatory Visit: Payer: Self-pay | Admitting: Psychiatry

## 2022-10-19 ENCOUNTER — Telehealth: Payer: Self-pay

## 2022-10-19 NOTE — Telephone Encounter (Signed)
   Received a PA request for Botox in CMM.

## 2022-10-22 NOTE — Telephone Encounter (Signed)
Completed auth via [facility] and received instant approval PA Case: [id number] (10/22/22-10/22/23).

## 2022-10-24 ENCOUNTER — Encounter: Payer: Self-pay | Admitting: Psychiatry

## 2022-10-24 ENCOUNTER — Telehealth (INDEPENDENT_AMBULATORY_CARE_PROVIDER_SITE_OTHER): Payer: Medicaid Other | Admitting: Psychiatry

## 2022-10-24 DIAGNOSIS — F1021 Alcohol dependence, in remission: Secondary | ICD-10-CM | POA: Insufficient documentation

## 2022-10-24 DIAGNOSIS — Z79899 Other long term (current) drug therapy: Secondary | ICD-10-CM

## 2022-10-24 DIAGNOSIS — F445 Conversion disorder with seizures or convulsions: Secondary | ICD-10-CM

## 2022-10-24 DIAGNOSIS — F1290 Cannabis use, unspecified, uncomplicated: Secondary | ICD-10-CM

## 2022-10-24 DIAGNOSIS — F331 Major depressive disorder, recurrent, moderate: Secondary | ICD-10-CM | POA: Diagnosis not present

## 2022-10-24 DIAGNOSIS — F411 Generalized anxiety disorder: Secondary | ICD-10-CM

## 2022-10-24 DIAGNOSIS — F1421 Cocaine dependence, in remission: Secondary | ICD-10-CM
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
## 2022-10-24 NOTE — Patient Instructions (Signed)
[email or web address]  [email or web address]  [employer] [email or web address] [address] [place], [place] [address]   [phone]  [facility], [facility] [email or web address] [address] [place], [place] [address]   [phone]   [facility] - [phone]  [facility] - [phone]  [facility] - [phone] counseling [phone]  [facility] [phone]   [email or web address] this clinician can offer telehealth and has a sliding scale option  [email or web address] this group also offers sliding scale rates and is based out of [place]   [employer] [facility] has interns who offer sliding scale rates and some of the full time clinicians do, as well. You complete their contact form on their website and the referrals coordinator will help to get connected to someone   Medicaid below :  [facility] [address] [place], [place] [address]  [phone]    [employer] [address] [place], [place] [address]  [phone]    [facility] [address] [place], [place] [address]  [phone]     Repetitive Transcranial Magnetic Stimulation Repetitive transcranial magnetic stimulation (rTMS) is an effective type of brain stimulation therapy that is used to treat major depression or other mental health conditions. You may have this procedure if your depression has not responded to medicine and other treatments. In rTMS, an insulated magnetic coil is held near your head. It delivers short electromagnetic pulses to specific areas of the brain that help to control mood. These electric currents stimulate nerve cells in the targeted area. You may have multiple treatments of rTMS. You will  not feel the electric current or feel pain. Tell a health care provider about: Any allergies you have. All medicines you are taking, including vitamins, herbs, eye drops, creams, and over-the-counter medicines. Any bleeding problems you have. Any surgeries you have had. Any medical conditions you have. Whether you are pregnant or may be pregnant. Any metal you have in your body, such as a cochlear implant, spinal cord stimulator, pacemaker, stents, plates, or screws. What are the risks? Rare side effects include: Seizures. These are more common in people who have epilepsy, have a history of seizures, or are already taking certain medicines that can trigger seizures. Experiencing periods of elation or irritability and high energy (mania). This is more common in people who have bipolar disorder. Hearing loss. This can happen if you do not have good ear protection during treatment. What happens before the procedure? Ask your health care provider about: Changing or stopping your regular medicines. This is especially important if you are taking diabetes medicines or blood thinners. Taking over-the-counter medicines, vitamins, herbs, and supplements. What happens during the procedure?  You will be seated in a reclining chair. You will be given earplugs to wear. Your health care provider will use a machine to place an electromagnetic coil against your head. Your health care provider will use a computer program to send electromagnetic pulses through the coil to specific areas of your brain. You may hear clicking sounds and feel tapping on your head. Your health care provider may adjust or move the coil during your treatment session. The procedure may vary among health care providers and hospitals. What can I expect after the procedure? After the procedure, it is common to have: A headache. Light-headedness. Mild discomfort, twitching, or tingling in your scalp. Discomfort  in your jaw or cheek  on the side of your head where you had the procedure. Follow these instructions at home: Take over-the-counter and prescription medicines only as told by your health care provider. Watch for any symptoms of depression. Keep all follow-up visits. This is important. Contact a health care provider if: Your headache and scalp discomfort continue after your treatment series is done. You have hearing loss or difficulty hearing. Your depression continues or gets worse. Get help right away if: You experience a period of elation or irritability and high energy (mania). You have serious thoughts about hurting yourself or others. If you ever feel like you may hurt yourself or others, or have thoughts about taking your own life, get help right away. Go to your nearest emergency department or: Call your local emergency services (911 in the [place] Call a suicide crisis helpline, such as the National Suicide Prevention Lifeline at 4303979755 or 988 in the [place] This is open 24 hours a day in the [place] Text the Crisis Text Line at 346 319 8621 (in the [place] Summary Repetitive transcranial magnetic stimulation (rTMS) is an effective type of brain stimulation therapy that is used to treat major depression or other mental health conditions. You may have this procedure if your depression has not responded to medicine and other treatments. In rTMS, an insulated magnetic coil is held near your head. It delivers short electromagnetic pulses to nerve cells in targeted areas. After the procedure, it is common to have a headache, light-headedness, and mild discomfort in your scalp or jaw. This information is not intended to replace advice given to you by your health care provider. Make sure you discuss any questions you have with your health care provider. Document Revised: 10/26/2020 Document Reviewed: 07/20/2020 Elsevier Patient Education  2024 [employer].

## 2022-11-08 ENCOUNTER — Other Ambulatory Visit (HOSPITAL_COMMUNITY): Payer: Self-pay

## 2022-11-12 ENCOUNTER — Other Ambulatory Visit (HOSPITAL_COMMUNITY): Payer: Self-pay

## 2022-11-13 ENCOUNTER — Other Ambulatory Visit (HOSPITAL_COMMUNITY): Payer: Self-pay

## 2022-11-13 ENCOUNTER — Other Ambulatory Visit: Payer: Self-pay

## 2022-11-22 ENCOUNTER — Ambulatory Visit: Payer: Medicaid Other | Admitting: Neurology

## 2022-12-06 ENCOUNTER — Encounter: Payer: Self-pay | Admitting: Psychiatry

## 2022-12-06 ENCOUNTER — Ambulatory Visit (INDEPENDENT_AMBULATORY_CARE_PROVIDER_SITE_OTHER): Payer: Medicaid Other | Admitting: Psychiatry

## 2022-12-06 VITALS — BP 97/67 | HR 67 | Temp 98.0°F | Ht 67.0 in | Wt 159.8 lb

## 2022-12-06 DIAGNOSIS — F1421 Cocaine dependence, in remission: Secondary | ICD-10-CM

## 2022-12-06 DIAGNOSIS — F1290 Cannabis use, unspecified, uncomplicated: Secondary | ICD-10-CM | POA: Diagnosis not present

## 2022-12-06 DIAGNOSIS — F1021 Alcohol dependence, in remission: Secondary | ICD-10-CM

## 2022-12-06 DIAGNOSIS — Z79899 Other long term (current) drug therapy: Secondary | ICD-10-CM

## 2022-12-06 DIAGNOSIS — F331 Major depressive disorder, recurrent, moderate: Secondary | ICD-10-CM

## 2022-12-06 DIAGNOSIS — Z9189 Other specified personal risk factors, not elsewhere classified: Secondary | ICD-10-CM

## 2022-12-06 DIAGNOSIS — F411 Generalized anxiety disorder: Secondary | ICD-10-CM

## 2022-12-06 DIAGNOSIS — F445 Conversion disorder with seizures or convulsions: Secondary | ICD-10-CM | POA: Diagnosis not present

## 2022-12-06 NOTE — Patient Instructions (Signed)
 Please call for EKG - [phone]  Aripiprazole Tablets What is this medication? ARIPIPRAZOLE (ay ri PIP ray zole) treats schizophrenia, bipolar I disorder, autism spectrum disorder, and Tourette disorder. It may also be used with antidepressant medications to treat depression. It works by balancing the levels of dopamine and serotonin in the brain, hormones that help regulate mood, behaviors, and thoughts. It belongs to a group of medications called antipsychotics. Antipsychotics can be used to treat several kinds of mental health conditions. This medicine may be used for other purposes; ask your health care provider or pharmacist if you have questions. COMMON BRAND NAME(S): Abilify What should I tell my care team before I take this medication? They need to know if you have any of these conditions: Dementia Diabetes Difficulty swallowing Have trouble controlling your muscles Heart disease History of irregular heartbeat History of stroke Low blood cell levels (white cells, red cells, and platelets) Low blood pressure Parkinson disease Seizures Suicidal thoughts, plans, or attempt by you or a family member Urges to engage in impulsive behaviors in ways that are unusual for you An unusual or allergic reaction to aripiprazole, other medications, foods, dyes, or preservatives Pregnant or trying to get pregnant Breastfeeding How should I use this medication? Take this medication by mouth with a glass of water. Take it as directed on the prescription label at the same time every day. You can take it with or without food. If it upsets your stomach, take it with food. Do not take your medication more often than directed. Keep taking it unless your care team tells you to stop. A special MedGuide will be given to you by the pharmacist with each prescription and refill. Be sure to read this information carefully each time. Talk to your care team about the use of this medication in children. While  it may be prescribed for children as young as 6 years for selected conditions, precautions do apply. Overdosage: If you think you have taken too much of this medicine contact a poison control center or emergency room at once. NOTE: This medicine is only for you. Do not share this medicine with others. What if I miss a dose? If you miss a dose, take it as soon as you can. If it is almost time for your next dose, take only that dose. Do not take double or extra doses. What may interact with this medication? Do not take this medication with any of the following: Brexpiprazole Cisapride Dextromethorphan; quinidine Dronedarone Metoclopramide Pimozide Quinidine Thioridazine This medication may also interact with the following: Antihistamines for allergy, cough, and cold Carbamazepine Certain medications for anxiety or sleep Certain medications for depression, such as amitriptyline, fluoxetine, paroxetine, or sertraline Certain medications for fungal infections, such as fluconazole, itraconazole, ketoconazole, posaconazole, or voriconazole Clarithromycin General anesthetics, such as halothane, isoflurane, methoxyflurane, or propofol Medications for Parkinson disease, such as levodopa Medications for blood pressure Medications for seizures Medications that relax muscles for surgery Opioid medications for pain Other medications that cause heart rhythm changes Phenothiazines, such as chlorpromazine or prochlorperazine Rifampin This list may not describe all possible interactions. Give your health care provider a list of all the medicines, herbs, non-prescription drugs, or dietary supplements you use. Also tell them if you smoke, drink alcohol, or use illegal drugs. Some items may interact with your medicine. What should I watch for while using this medication? Visit your care team for regular checks on your progress. Tell your care team if your symptoms do not start to  get better or if they  get worse. Do not suddenly stop taking this medication. You may develop a severe reaction. Your care team will tell you how much medication to take. If your care team wants you to stop the medication, the dose may be slowly lowered over time to avoid any side effects. Patients and their families should watch out for new or worsening depression or thoughts of suicide. Also watch out for sudden changes in feelings such as feeling anxious, agitated, panicky, irritable, hostile, aggressive, impulsive, severely restless, overly excited and hyperactive, or not being able to sleep. If this happens, especially at the beginning of antidepressant treatment or after a change in dose, call your care team. This medication may affect your coordination, reaction time, or judgment. Do not drive or operate machinery until you know how this medication affects you. Sit up or stand slowly to reduce the risk of dizzy or fainting spells. Drinking alcohol with this medication can increase the risk of these side effects. This medication can cause problems with controlling your body temperature. It can lower the response of your body to cold temperatures. If possible, stay indoors during cold weather. If you must go outdoors, wear warm clothes. It can also lower the response of your body to heat. Do not overheat. Do not over-exercise. Stay out of the sun when possible. If you must be in the sun, wear cool clothing. Drink plenty of water. If you have trouble controlling your body temperature, call your care team right away. This medication may cause dry eyes and blurred vision. If you wear contact lenses, you may feel some discomfort. Lubricating eye drops may help. See your care team if the problem does not go away or is severe. This medication may increase blood sugar. Ask your care team if changes in diet or medications are needed if you have diabetes. There have been reports of increased sexual urges or other strong urges such as  gambling while taking this medication. If you experience any of these while taking this medication, you should report this to your care team as soon as possible. What side effects may I notice from receiving this medication? Side effects that you should report to your care team as soon as possible: Allergic reactions--skin rash, itching, hives, swelling of the face, lips, tongue, or throat High blood sugar (hyperglycemia)--increased thirst or amount of urine, unusual weakness or fatigue, blurry vision High fever, stiff muscles, increased sweating, fast or irregular heartbeat, and confusion, which may be signs of neuroleptic malignant syndrome Low blood pressure--dizziness, feeling faint or lightheaded, blurry vision Pain or trouble swallowing Prolonged or painful erection Seizures Stroke--sudden numbness or weakness of the face, arm, or leg, trouble speaking, confusion, trouble walking, loss of balance or coordination, dizziness, severe headache, change in vision Uncontrolled and repetitive body movements, muscle stiffness or spasms, tremors or shaking, loss of balance or coordination, restlessness, shuffling walk, which may be signs of extrapyramidal symptoms (EPS) Thoughts of suicide or self-harm, worsening mood, feelings of depression Urges to engage in impulsive behaviors such as gambling, binge eating, sexual activity, or shopping in ways that are unusual for you Side effects that usually do not require medical attention (report these to your care team if they continue or are bothersome): Constipation Drowsiness Weight gain This list may not describe all possible side effects. Call your doctor for medical advice about side effects. You may report side effects to FDA at 1-800-FDA-1088. Where should I keep my medication? Keep out of the  reach of children and pets. Store at room temperature between 15 and 30 degrees C (59 and 86 degrees F). Throw away any unused medication after the expiration  date. NOTE: This sheet is a summary. It may not cover all possible information. If you have questions about this medicine, talk to your doctor, pharmacist, or health care provider.  2024 Elsevier/Gold Standard (2021-10-21 00:00:00)

## 2022-12-06 NOTE — Progress Notes (Signed)
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  Patient today reports she continues to struggle with depression symptoms.  She reports she lacks interest in self-care.  She reports she showers every 2 weeks or so.  She feels sad, has anhedonia, low motivation, low energy, concentration problems.  Patient also reports anxiety symptoms, worrying about things on a regular basis.  Patient does not believe the current medications are helpful.  She does not believe the Zoloft higher dosage has made a difference.  Patient reports she has significant history of migraine headaches and hence she does not want to change her nortriptyline since that is the only medication that helps with her 'aura'.  Patient reports she does have psychosocial stressors at home, reports she is the primary caregiver for her mother who needs a lot of support.  She reports there is a lot of relationship problems going on with her mother who tends to be very ' manipulative'.  Patient reports her husband is a huge support system for her.  Patient denies any suicidality, homicidality or perceptual disturbances.  Patient reports she is currently cutting back on her cannabis use.  Patient reports she has not been able to get any of the labs done however is willing to go to the lab today.  Collateral information obtained from spouse-Nancy Porter -who showed this provider videos of the patient having seizure-like spells.  According to spouse she struggles  on a daily basis and it is very hard to predict when she has these episodes.  They are currently working with a neurologist also.  Spouse agrees to emotionally provide support to patient and make sure she gets established with a therapist.  Visit Diagnosis:    ICD-10-CM   1. MDD (major depressive disorder), recurrent episode, moderate (HCC)  F33.1 EKG 12-Lead    Sodium    Urine drugs of abuse scrn w alc, routine (Ref Lab)    Lipid panel    Hemoglobin A1C    2. GAD (generalized anxiety disorder)  F41.1 Sodium    Lipid panel    Hemoglobin A1C    3. 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 Long term current use of cannabis  F12.90     5. Cocaine use disorder, moderate, in sustained remission (HCC)  F14.21     6. History of alcoholism (HCC)  F10.21     7. High risk medication use  Z79.899 Sodium    Urine drugs of abuse scrn w alc, routine (Ref Lab)    Lipid panel    Hemoglobin A1C    8. At risk for prolonged QT interval syndrome  Z91.89 EKG 12-Lead      Past Psychiatric History: I have reviewed past psychiatric history from progress note on 10/24/2022.  Past medications, sertraline, clonazepam.  Past Medical History:  Past Medical History:  Diagnosis Date   Anxiety    Arrhythmia    COPD (chronic obstructive pulmonary disease) (HCC)    Depression  Deviated septum    Hx of migraines    Hx of suicide attempt 2010   overdose on Trileptal, has been on Seroquel in the past   Hyperlipidemia    Tobacco use     Past Surgical History:  Procedure Laterality Date   ABDOMINAL HYSTERECTOMY     AUGMENTATION MAMMAPLASTY Bilateral    implants 30 years ago-saline   BREAST BIOPSY Right 08/19/2019   Affirm bx-"coil" clip-path pending   BREAST ENHANCEMENT SURGERY     CESAREAN SECTION     x 2   LEFT HEART CATH AND CORONARY ANGIOGRAPHY N/A 09/08/2020   Procedure: LEFT HEART CATH AND CORONARY ANGIOGRAPHY;  Surgeon: Lamar Blinks, MD;  Location:  [facility] INVASIVE CV LAB;  Service: Cardiovascular;  Laterality: N/A;   SHOULDER SURGERY Left    impingement   TRANSUMBILICAL AUGMENTATION MAMMAPLASTY      Family Psychiatric History: Reviewed family psychiatric history from progress note on 10/24/2022.  Family History:  Family History  Problem Relation Age of Onset   Glaucoma Mother    Heart disease Mother    Heart Problems Mother    Arrhythmia Mother        fib   Deep vein thrombosis Father    Heart Problems Father    Heart disease Father    Migraines Sister    Diabetes Paternal Uncle        adult onset   CAD Maternal Grandfather    Stroke Maternal Grandmother    Aneurysm Maternal Grandmother    Heart disease Maternal Grandmother    Drug abuse Son    Seizures Neg Hx     Social History: Reviewed social history from progress note on 10/24/2022. Social History   Socioeconomic History   Marital status: Married    Spouse name: Nancy Porter   Number of children: 2   Years of education: some college   Highest education level: Some college, no degree  Occupational History   Occupation: Scientist, physiological: [employer]  Tobacco Use   Smoking status: Every Day    Current packs/day: 0.00    Average packs/day: 0.5 packs/day for 35.0 years (17.5 ttl pk-yrs)    Types: Cigarettes    Start date: 01/08/1984    Last attempt to quit: 01/08/2019    Years since quitting: 3.9   Smokeless tobacco: Never   Tobacco comments:    now 1/2 ppd   Vaping Use   Vaping status: Some Days  Substance and Sexual Activity   Alcohol use: Yes    Comment: occ maybe 1-2 a month   Drug use: Yes    Frequency: 7.0 times per week    Types: Marijuana    Comment: cocaine in the past   Sexual activity: Not on file  Other Topics Concern   Not on file  Social History Narrative   Patient is left-handed. She lives with her husband and daughter in a 1 story house.  She has not been exercising in the last year.    1 cups coffee daily.   Social  Determinants of Health   Financial Resource Strain: Medium Risk (06/27/2022)   Received from [facility], [facility]   Overall Financial Resource Strain (CARDIA)    Difficulty of Paying Living Expenses: Somewhat hard  Food Insecurity: Food Insecurity Present (06/27/2022)   Received from [facility], [facility]   Hunger Vital Sign    Worried About Running Out of  Food in the Last Year: Sometimes true    Ran Out of Food in the Last Year: Never true  Transportation Needs: Unmet Transportation Needs (06/27/2022)   Received from [facility], [facility]   [facility] - Transportation    In the past 12 months, has lack of transportation kept you from medical appointments or from getting medications?: Yes    Lack of Transportation (Non-Medical): Yes  Physical Activity: Not on file  Stress: Not on file  Social Connections: Not on file    Allergies:  Allergies  Allergen Reactions   Other     Hormone patches---caused inability to speak/walk    Metabolic Disorder Labs: No results found for: "HGBA1C", "MPG" No results found for: "PROLACTIN" No results found for: "CHOL", "TRIG", "HDL", "CHOLHDL", "VLDL", "LDLCALC" Lab Results  Component Value Date   TSH 1.110 04/10/2022    Therapeutic Level Labs: No results found for: "LITHIUM" No results found for: "VALPROATE" No results found for: "CBMZ"  Current Medications: Current Outpatient Medications  Medication Sig Dispense Refill   albuterol (VENTOLIN HFA) 108 (90 Base) MCG/ACT inhaler TAKE 2 PUFFS BY MOUTH EVERY 6 HOURS AS NEEDED FOR WHEEZE OR SHORTNESS OF BREATH (Patient taking differently: Inhale 2 puffs into the lungs See admin instructions. Inhale 2 puffs (scheduled)  twice daily & use  every 6 hours as needed for wheezing/shortness of breath.) 8.5 each 2   botulinum toxin Type A (BOTOX) 200 units injection Provider to inject 155 units  into the muscles of the head and neck every 12 weeks. Discard remainder. 1 each 3   clonazePAM (KLONOPIN) 0.5 MG tablet Take 0.5 mg by mouth in the morning and at bedtime.     COLLAGEN PO Take 1 capsule by mouth daily.     Erenumab-aooe (AIMOVIG) 140 MG/ML SOAJ Inject 156.8 mg into the skin every 30 (thirty) days. 1.12 mL 2   nortriptyline (PAMELOR) 50 MG capsule Take 1 capsule (50 mg total) by mouth at bedtime. 90 capsule 3   sertraline (ZOLOFT) 50 MG tablet Take 50 mg by mouth daily. 1 tablet daily for 2 weeks then increase to 75mg  daily     SPIRULINA PO Take 1 tablet by mouth daily.     topiramate (TOPAMAX) 100 MG tablet Take 1 tablet (100 mg total) by mouth 2 (two) times daily. 180 tablet 3   Ubrogepant (UBRELVY) 100 MG TABS Take 1 tablet (100 mg total) by mouth as needed (take 1 tablet for acute headache, may repeat in 2 hours if needed, max is 200 mg in 24 hours). 12 tablet 11   No current facility-administered medications for this visit.     Musculoskeletal: Strength & Muscle Tone: within normal limits Gait & Station: normal Patient leans: N/A  Psychiatric Specialty Exam: Review of Systems  Neurological:        Seizure-like attacks  Psychiatric/Behavioral:  Positive for decreased concentration and dysphoric mood. The patient is nervous/anxious.     Blood pressure 97/67, pulse 67, temperature 98 F (36.7 C), temperature source Skin, height 5\' 7"  (1.702 m), weight 159 lb 12.8 oz (72.5 kg).Body mass index is 25.03 kg/m.  General Appearance: Fairly Groomed  Eye Contact:  Fair  Speech:  Clear and Coherent  Volume:  Normal  Mood:  Anxious, Depressed, and Dysphoric  Affect:  Appropriate  Thought Process:  Goal Directed and Descriptions of Associations: Intact  Orientation:  Full (Time, Place, and Person)  Thought Content: Logical   Suicidal Thoughts:  No  Homicidal Thoughts:  No  Memory:  Immediate;   Fair Recent;   Fair Remote;   Fair  Judgement:  Fair  Insight:  Fair   Psychomotor Activity:  Normal  Concentration:  Concentration: Fair and Attention Span: Fair  Recall:  [employer] of Knowledge: Fair  Language: Fair  Akathisia:  No  Handed:  Left  AIMS (if indicated): not done  Assets:  Communication Skills Desire for Improvement Housing Intimacy Social Support  ADL's:  Intact  Cognition: WNL  Sleep:  Fair   Screenings: GAD-7    Garment/textile technologist Visit from 12/06/2022 in [facility] Video Visit from 10/24/2022 in [facility]  Total GAD-7 Score 19 21      PHQ2-9    Flowsheet Row Office Visit from 12/06/2022 in [facility] Video Visit from 10/24/2022 in [facility]  PHQ-2 Total Score 6 3  PHQ-9 Total Score 16 17      Flowsheet Row Office Visit from 12/06/2022 in [facility] Video Visit from 10/24/2022 in [facility]  C-SSRS RISK CATEGORY Low Risk Low Risk        Assessment and Plan: Nancy Porter is a 52 year old Caucasian female with history of depression, anxiety, history of trauma, functional neurological symptoms disorder, migraine headaches, continues to struggle with seizure-like spells, mood symptoms, will benefit from following plan.  Plan  MDD-unstable Continue sertraline 75 mg p.o. daily Continue nortriptyline 50 mg p.o. nightly, prescribed by neurology for headaches. Will consider starting patient on Abilify however she will need an EKG first.  Patient to go to [facility]. Discussed referral to PHP previously patient not interested. Provided education with patient as well as spouse to establish care with a therapist, provided resources.  GAD-unstable Patient will benefit from psychotherapy-patient to establish care with therapist Sertraline 75 mg p.o. daily Nortriptyline 50 mg p.o.  daily Patient is also on clonazepam 0.5 mg p.o. twice daily prescribed by primary care provider.  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 Patient to start CBT/DBT with a therapist.  Cocaine use disorder in remission/history of alcoholism/current use of cannabis-pending urine drug screen.  Patient encouraged to get it done. Patient advised to stop using cannabis.   High risk medication use-will order labs including lipid panel, hemoglobin A1c, sodium level, UDS.  Patient to go to [facility] lab.  At risk for prolonged QT syndrome-we will order EKG-patient to call [phone]. Once I review EKG will consider starting Abilify.  Patient provided education about drug to drug interaction with medications including Abilify, sertraline, nortriptyline including cardiac effect.  Collaboration of Care: Collaboration of Care: Referral or follow-up with counselor/therapist AEB patient encouraged to establish care with therapist.  Collateral information obtained from spouse as noted above.  Patient/Guardian was advised Release of Information must be obtained prior to any record release in order to collaborate their care with an outside provider. Patient/Guardian was advised if they have not already done so to contact the registration department to sign all necessary forms in order for [place] to release information regarding their care.   Consent: Patient/Guardian gives verbal consent for treatment and assignment of benefits for services provided during this visit. Patient/Guardian expressed understanding and agreed to proceed.   Follow-up in clinic in 3 to 4 weeks or sooner in person.  This note was generated in part or whole  with voice recognition software. Voice recognition is usually quite accurate but there are transcription errors that can and very often do occur. I apologize for any typographical errors that were not detected and  corrected.    Jomarie Longs, MD 12/07/2022, 8:27 AM

## 2022-12-12 ENCOUNTER — Other Ambulatory Visit
Admission: RE | Admit: 2022-12-12 | Discharge: 2022-12-12 | Disposition: A | Discharge status: Home / Self Care | Payer: Medicaid Other | Source: Ambulatory Visit | Attending: Psychiatry | Admitting: Psychiatry

## 2022-12-12 ENCOUNTER — Ambulatory Visit
Admission: RE | Admit: 2022-12-12 | Discharge: 2022-12-12 | Disposition: A | Discharge status: Home / Self Care | Payer: Medicaid Other | Source: Ambulatory Visit | Attending: Psychiatry | Admitting: Psychiatry

## 2022-12-12 ENCOUNTER — Telehealth: Payer: Self-pay | Admitting: Psychiatry

## 2022-12-12 DIAGNOSIS — F331 Major depressive disorder, recurrent, moderate: Secondary | ICD-10-CM | POA: Diagnosis present

## 2022-12-12 DIAGNOSIS — F411 Generalized anxiety disorder: Secondary | ICD-10-CM | POA: Insufficient documentation

## 2022-12-12 DIAGNOSIS — Z79899 Other long term (current) drug therapy: Secondary | ICD-10-CM | POA: Diagnosis present

## 2022-12-12 DIAGNOSIS — R9431 Abnormal electrocardiogram [ECG] [EKG]: Secondary | ICD-10-CM | POA: Insufficient documentation

## 2022-12-12 DIAGNOSIS — Z9189 Other specified personal risk factors, not elsewhere classified: Secondary | ICD-10-CM | POA: Diagnosis not present

## 2022-12-12 LAB — HEMOGLOBIN A1C
Hgb A1c MFr Bld: 5.8 % — ABNORMAL HIGH (ref 4.8–5.6)
Mean Plasma Glucose: 119.76 mg/dL

## 2022-12-12 LAB — LIPID PANEL
Cholesterol: 289 mg/dL — ABNORMAL HIGH (ref 0–200)
HDL: 57 mg/dL (ref >40)
LDL Cholesterol: 211 mg/dL — ABNORMAL HIGH (ref 0–99)
Total CHOL/HDL Ratio: 5.1 RATIO
Triglycerides: 104 mg/dL (ref <150)
VLDL: 21 mg/dL (ref 0–40)

## 2022-12-12 LAB — SODIUM: Sodium: 140 mmol/L (ref 135–145)

## 2022-12-12 NOTE — Telephone Encounter (Signed)
I have reviewed EKG, attempted to contact patient, had to leave a voicemail.

## 2022-12-13 ENCOUNTER — Ambulatory Visit: Payer: Medicaid Other | Admitting: Neurology

## 2022-12-13 VITALS — BP 107/70

## 2022-12-13 DIAGNOSIS — G43709 Chronic migraine without aura, not intractable, without status migrainosus: Secondary | ICD-10-CM | POA: Diagnosis not present

## 2022-12-13 MED ORDER — ONABOTULINUMTOXINA 200 UNITS IJ SOLR
155.0000 [IU] | Freq: Once | INTRAMUSCULAR | Status: AC
  Administered 2022-12-13: 155 [IU] via INTRAMUSCULAR

## 2022-12-13 MED ORDER — AIMOVIG 140 MG/ML ~~LOC~~ SOAJ: 1.0000 mL | SUBCUTANEOUS | 11 refills | Status: DC

## 2022-12-13 NOTE — Progress Notes (Signed)
BOTOX PROCEDURE NOTE FOR MIGRAINE HEADACHE  HISTORY: Here today for Botox. Last was 08/29/22 by me. This will be her 3rd injection. Giving her samples of Aimovig 140 mg monthly injection for migraine prevention. Few more migraines, stress triggered, having more seizures as well her face grimaces triggering migraine. 4 migraines a week, daily headache but those are not a big deal. Aimovig + Botox headaches are less severe, less frequent, were near daily severe migraine! Bernita Raisin is very helpful, takes all # 12 a month. On topamax 100 mg BID, nortriptyline 50 mg. Is seeing psych now, they had wanted to stop the nortriptyline, but she refused, is very helpful for her auras.     Description of procedure:  The patient was placed in a sitting position. The standard protocol was used for Botox as follows, with 5 units of Botox injected at each site:   -Procerus muscle, midline injection  -Corrugator muscle, bilateral injection  -Frontalis muscle, bilateral injection, with 2 sites each side, medial injection was performed in the upper one third of the frontalis muscle, in the region vertical from the medial inferior edge of the superior orbital rim. The lateral injection was again in the upper one third of the forehead vertically above the lateral limbus of the cornea, 1.5 cm lateral to the medial injection site.  -Temporalis muscle injection, 4 sites, bilaterally. The first injection was 3 cm above the tragus of the ear, second injection site was 1.5 cm to 3 cm up from the first injection site in line with the tragus of the ear. The third injection site was 1.5-3 cm forward between the first 2 injection sites. The fourth injection site was 1.5 cm posterior to the second injection site.  -Occipitalis muscle injection, 3 sites, bilaterally. The first injection was done one half way between the occipital protuberance and the tip of the mastoid process behind the ear. The second injection site was done  lateral and superior to the first, 1 fingerbreadth from the first injection. The third injection site was 1 fingerbreadth superiorly and medially from the first injection site.  -Cervical paraspinal muscle injection, 2 sites, bilateral, the first injection site was 1 cm from the midline of the cervical spine, 3 cm inferior to the lower border of the occipital protuberance. The second injection site was 1.5 cm superiorly and laterally to the first injection site.  -Trapezius muscle injection was performed at 3 sites, bilaterally. The first injection site was in the upper trapezius muscle halfway between the inflection point of the neck, and the acromion. The second injection site was one half way between the acromion and the first injection site. The third injection was done between the first injection site and the inflection point of the neck.  I did 5 units to right and left masseter.   A 200 unit bottle of Botox was used, 165 units were injected, the rest of the Botox was wasted. The patient tolerated the procedure well, there were no complications of the above procedure.  Botox NDC [phone] Lot number [id number] Expiration date 02/14/2025  Unfortunately, I do not have any Aimovig samples to offer.  I am going to order Aimovig 140 mg monthly injection for migraine prevention to go through her insurance.  Botox + Aimovig has been very helpful see above for specifics.  Meds ordered this encounter  Medications   botulinum toxin Type A (BOTOX) injection 155 Units    Botox- 200 units x 1 vial Lot: [id number] Expiration: 11.2026 NDC: [phone]  DISCONTD: Erenumab-aooe (AIMOVIG) 140 MG/ML SOAJ    Sig: Inject 140 mg into the skin every 30 (thirty) days.    Dispense:  1 mL    Refill:  11   Erenumab-aooe (AIMOVIG) 140 MG/ML SOAJ    Sig: Inject 140 mg into the skin every 30 (thirty) days.    Dispense:  1 mL    Refill:  11

## 2022-12-13 NOTE — Progress Notes (Signed)
Botox- 200 units x 1 vial Lot: [id number] Expiration: 11.2026 NDC: 0023-3921-02  Bacteriostatic 0.9% Sodium Chloride- 4 mL  Lot: [id number] Expiration: 04.01.2024 NDC: [id number]  Dx: H08.657 S/P Witnessed by Cinda Quest, CMA

## 2022-12-14 ENCOUNTER — Telehealth: Payer: Self-pay | Admitting: Psychiatry

## 2022-12-14 NOTE — Telephone Encounter (Signed)
I have reviewed EKG, however urine drug screen pending.  Will wait for urine drug screen results prior to initiation of mood stabilizer-like Abilify.  Attempted to contact patient however had to leave a voicemail-currently awaiting patient to call us back.

## 2022-12-17 LAB — URINE DRUGS OF ABUSE SCREEN W ALC, ROUTINE (REF LAB)
Amphetamines, Urine: NEGATIVE ng/mL
Barbiturate, Ur: NEGATIVE ng/mL
Benzodiazepine Quant, Ur: NEGATIVE ng/mL
Cocaine (Metab.): NEGATIVE ng/mL
Ethanol U, Quan: NEGATIVE %
Methadone Screen, Urine: NEGATIVE ng/mL
Opiate Quant, Ur: NEGATIVE ng/mL
Phencyclidine, Ur: NEGATIVE ng/mL
Propoxyphene, Urine: NEGATIVE ng/mL

## 2022-12-17 LAB — PANEL 799049
CARBOXY THC GC/MS CONF: 68 ng/mL
Cannabinoid GC/MS, Ur: POSITIVE — AB

## 2022-12-19 ENCOUNTER — Telehealth: Payer: Self-pay | Admitting: Psychiatry

## 2022-12-19 NOTE — Telephone Encounter (Signed)
Attempted to contact patient again to discuss labs as well as EKG and to start medication as discussed during her visit, had to leave a voicemail again.

## 2022-12-20 ENCOUNTER — Telehealth: Payer: Self-pay | Admitting: Psychiatry

## 2022-12-20 DIAGNOSIS — F331 Major depressive disorder, recurrent, moderate: Secondary | ICD-10-CM

## 2022-12-20 MED ORDER — ARIPIPRAZOLE 2 MG PO TABS: 2.0000 mg | ORAL_TABLET | Freq: Every day | ORAL | 0 refills | Status: DC

## 2022-12-20 NOTE — Telephone Encounter (Signed)
Contacted patient to discuss labs, EKG.  Agreeable to trial of Abilify 2 mg.  Provided medication education.  Advised to get immediate help with any side effects or worsening mood symptoms.

## 2022-12-20 NOTE — Telephone Encounter (Signed)
Patient left a message at 10:23 am 12/20/22 returning your call

## 2023-01-09 ENCOUNTER — Telehealth: Payer: Medicaid Other | Admitting: Psychiatry

## 2023-01-09 ENCOUNTER — Telehealth: Payer: Self-pay | Admitting: Psychiatry

## 2023-01-09 ENCOUNTER — Encounter: Payer: Self-pay | Admitting: Psychiatry

## 2023-01-09 DIAGNOSIS — F1290 Cannabis use, unspecified, uncomplicated: Secondary | ICD-10-CM

## 2023-01-09 DIAGNOSIS — F331 Major depressive disorder, recurrent, moderate: Secondary | ICD-10-CM

## 2023-01-09 DIAGNOSIS — F411 Generalized anxiety disorder: Secondary | ICD-10-CM | POA: Diagnosis not present

## 2023-01-09 DIAGNOSIS — F445 Conversion disorder with seizures or convulsions: Secondary | ICD-10-CM

## 2023-01-09 DIAGNOSIS — Z79899 Other long term (current) drug therapy: Secondary | ICD-10-CM

## 2023-01-09 DIAGNOSIS — F1021 Alcohol dependence, in remission: Secondary | ICD-10-CM

## 2023-01-09 DIAGNOSIS — F1421 Cocaine dependence, in remission: Secondary | ICD-10-CM

## 2023-01-09 NOTE — Progress Notes (Unsigned)
Virtual Visit via Video Note  I connected with Nancy Porter on 01/09/23 at 10:30 AM EDT by a video enabled telemedicine application and verified that I am speaking with the correct person using two identifiers.  Location Provider Location : [facility] Patient Location : Home  Participants: Patient , Provider    I discussed the limitations of evaluation and management by telemedicine and the availability of in person appointments. The patient expressed understanding and agreed to proceed.   I discussed the assessment and treatment plan with the patient. The patient was provided an opportunity to ask questions and all were answered. The patient agreed with the plan and demonstrated an understanding of the instructions.   The patient was advised to call back or seek an in-person evaluation if the symptoms worsen or if the condition fails to improve as anticipated.   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  Patient today reports she has been unable to get the Abilify approved at the pharmacy.  Patient reports her pharmacy has sent a request to this provider however there is no record of that.  Patient reports she however currently feels she may not even need it since she is already feeling better on the higher dosage of sertraline.  She reports the last few days she has been spending a lot of time with her spouse.  He has been keeping her busy with several projects, they are going out more  and spending time together.  That does seem to help.  She reports she is trying to cope with the fact that she is struggling with her relationship with her mother.  She and her spouse has had discussions about this and they are planning to let her mom live with them for now since she has no other place to go to.  She is taking it day-by-day.  She has had at least 5 seizures since her last visit with this provider.  Patient with psychological seizure-like spells.  She has not been able to find a therapist, reports she has not looked for a therapist, not sure if she is ready yet.  Reports sleep is restless.  She reports she wakes up several times at night.  Patient agreeable to keep a sleep log.  She denies any suicidality, homicidality or perceptual disturbances.  Denies side effects to medications.  Continues to use cannabis however reports she is cutting back.  Denies any other concerns today.  Visit Diagnosis:    ICD-10-CM   1. MDD (major depressive disorder), recurrent episode, moderate (HCC)  F33.1     2. GAD (generalized anxiety disorder)  F41.1     3. 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 Long term current use of cannabis  F12.90     5. Cocaine use disorder, moderate, in sustained remission (HCC)  F14.21     6. History of alcoholism (HCC)  F10.21     7. High risk medication use  Z30.865       Past Psychiatric History: I have reviewed past psychiatric history from progress note on 10/24/2022.  Past medications like sertraline, clonazepam.  Past Medical History:  Past Medical History:  Diagnosis Date   Anxiety    Arrhythmia    COPD (chronic obstructive pulmonary disease) (HCC)    Depression    Deviated septum    Hx of migraines    Hx of suicide attempt 2010   overdose on Trileptal, has been on Seroquel in the past   Hyperlipidemia    Tobacco use     Past Surgical History:  Procedure Laterality Date   ABDOMINAL HYSTERECTOMY      AUGMENTATION MAMMAPLASTY Bilateral    implants 30 years ago-saline   BREAST BIOPSY Right 08/19/2019   Affirm bx-"coil" clip-path pending   BREAST ENHANCEMENT SURGERY     CESAREAN SECTION     x 2   LEFT HEART CATH AND CORONARY ANGIOGRAPHY N/A 09/08/2020   Procedure: LEFT HEART CATH AND CORONARY ANGIOGRAPHY;  Surgeon: Lamar Blinks, MD;  Location: [facility] INVASIVE CV LAB;  Service: Cardiovascular;  Laterality: N/A;   SHOULDER SURGERY Left    impingement   TRANSUMBILICAL AUGMENTATION MAMMAPLASTY      Family Psychiatric History: I have reviewed family psychiatric history from progress note on 10/24/2022.  Family History:  Family History  Problem Relation Age of Onset   Glaucoma Mother    Heart disease Mother    Heart Problems Mother    Arrhythmia Mother        fib   Deep vein thrombosis Father    Heart Problems Father    Heart disease Father    Migraines Sister    Diabetes Paternal Uncle        adult onset   CAD Maternal Grandfather    Stroke Maternal Grandmother    Aneurysm Maternal Grandmother    Heart disease Maternal Grandmother    Drug abuse Son    Seizures Neg Hx     Social History: I have reviewed social history from progress note on 10/24/2022. Social History   Socioeconomic History   Marital status: Married    Spouse name: Renae Fickle   Number of children: 2   Years of education: some college   Highest education level: Some college, no degree  Occupational History   Occupation: Scientist, physiological: [employer]  Tobacco Use   Smoking status: Every Day    Current packs/day: 0.00    Average packs/day: 0.5 packs/day for 35.0 years (17.5 ttl pk-yrs)    Types: Cigarettes    Start date: 01/08/1984    Last attempt to quit: 01/08/2019    Years since quitting: 4.0   Smokeless tobacco: Never   Tobacco comments:    now 1/2 ppd   Vaping Use   Vaping status: Some Days  Substance and Sexual Activity   Alcohol use: Yes    Comment: occ maybe 1-2 a  month   Drug use: Yes    Frequency: 7.0 times per week    Types: Marijuana    Comment: cocaine in the past   Sexual activity: Not on file  Other Topics Concern   Not on file  Social History Narrative   Patient is left-handed. She lives with her husband and daughter in a 1 story house.  She has not been exercising in the last year.    1 cups coffee daily.   Social Determinants of Corporate investment banker  Strain: Medium Risk (06/27/2022)   [facility], [facility]   Overall Financial Resource Strain (CARDIA)    Difficulty of Paying Living Expenses: Somewhat hard  Food Insecurity: Food Insecurity Present (06/27/2022)   Received from [facility], [facility]   Hunger Vital Sign    Worried About Running Out of Food in the Last Year: Sometimes true    Ran Out of Food in the Last Year: Never true  Transportation Needs: Unmet Transportation Needs (06/27/2022)   Received from [facility], [facility]   [facility] - Transportation    In the past 12 months, has lack of transportation kept you from medical appointments or from getting medications?: Yes    Lack of Transportation (Non-Medical): Yes  Physical Activity: Not on file  Stress: Not on file  Social Connections: Not on file    Allergies:  Allergies  Allergen Reactions   Other     Hormone patches---caused inability to speak/walk    Metabolic Disorder Labs: Lab Results  Component Value Date   HGBA1C 5.8 (H) 12/12/2022   MPG 119.76 12/12/2022   No results found for: "PROLACTIN" Lab Results  Component Value Date   CHOL 289 (H) 12/12/2022   TRIG 104 12/12/2022   HDL 57 12/12/2022   CHOLHDL 5.1 12/12/2022   VLDL 21 12/12/2022   LDLCALC 211 (H) 12/12/2022   Lab Results  Component Value Date   TSH 1.110 04/10/2022    Therapeutic Level Labs: No results found for: "LITHIUM" No results found for:  "VALPROATE" No results found for: "CBMZ"  Current Medications: Current Outpatient Medications  Medication Sig Dispense Refill   albuterol (VENTOLIN HFA) 108 (90 Base) MCG/ACT inhaler TAKE 2 PUFFS BY MOUTH EVERY 6 HOURS AS NEEDED FOR WHEEZE OR SHORTNESS OF BREATH (Patient taking differently: Inhale 2 puffs into the lungs See admin instructions. Inhale 2 puffs (scheduled)  twice daily & use  every 6 hours as needed for wheezing/shortness of breath.) 8.5 each 2   botulinum toxin Type A (BOTOX) 200 units injection Provider to inject 155 units into the muscles of the head and neck every 12 weeks. Discard remainder. 1 each 3   clonazePAM (KLONOPIN) 0.5 MG tablet Take 0.5 mg by mouth in the morning and at bedtime.     COLLAGEN PO Take 1 capsule by mouth daily.     Erenumab-aooe (AIMOVIG) 140 MG/ML SOAJ Inject 140 mg into the skin every 30 (thirty) days. 1 mL 11   nortriptyline (PAMELOR) 50 MG capsule Take 1 capsule (50 mg total) by mouth at bedtime. 90 capsule 3   sertraline (ZOLOFT) 50 MG tablet Take 50 mg by mouth daily. 1 tablet daily for 2 weeks then increase to 75mg  daily     SPIRULINA PO Take 1 tablet by mouth daily.     topiramate (TOPAMAX) 100 MG tablet Take 1 tablet (100 mg total) by mouth 2 (two) times daily. 180 tablet 3   Ubrogepant (UBRELVY) 100 MG TABS Take 1 tablet (100 mg total) by mouth as needed (take 1 tablet for acute headache, may repeat in 2 hours if needed, max is 200 mg in 24 hours). 12 tablet 11   ARIPiprazole (ABILIFY) 2 MG tablet Take 1 tablet (2 mg total) by mouth daily. (Patient not taking: Reported on 01/09/2023) 30 tablet 0   No current facility-administered medications for this visit.     Musculoskeletal: Strength & Muscle Tone:  UTA Gait &  Station:  Seated Patient leans: N/A  Psychiatric Specialty Exam: Review of Systems  Neurological:  Positive for headaches.  Psychiatric/Behavioral:  Positive for dysphoric mood and sleep disturbance. The patient is  nervous/anxious.     There were no vitals taken for this visit.There is no height or weight on file to calculate BMI.  General Appearance: Fairly Groomed  Eye Contact:  Fair  Speech:  Clear and Coherent  Volume:  Normal  Mood:  Anxious and Depressed  Affect:  Congruent  Thought Process:  Goal Directed and Descriptions of Associations: Intact  Orientation:  Full (Time, Place, and Person)  Thought Content: Logical   Suicidal Thoughts:  No  Homicidal Thoughts:  No  Memory:  Immediate;   Fair Recent;   Fair Remote;   Fair  Judgement:  Fair  Insight:  Fair  Psychomotor Activity:  Normal  Concentration:  Concentration: Fair and Attention Span: Fair  Recall:  [employer] of Knowledge: Fair  Language: Fair  Akathisia:  No  Handed:  Left  AIMS (if indicated): not done  Assets:  Communication Skills Desire for Improvement Housing Social Support  ADL's:  Intact  Cognition: WNL  Sleep:  Poor   Screenings: GAD-7    Flowsheet Row Office Visit from 12/06/2022 in [facility] Video Visit from 10/24/2022 in [facility]  Total GAD-7 Score 19 21      PHQ2-9    Flowsheet Row Office Visit from 12/06/2022 in [facility] Video Visit from 10/24/2022 in [facility]  PHQ-2 Total Score 6 3  PHQ-9 Total Score 16 17      Flowsheet Row Video Visit from 01/09/2023 in [facility] Office Visit from 12/06/2022 in [facility] Video Visit from 10/24/2022 in [facility]  C-SSRS RISK CATEGORY Low Risk Low Risk Low Risk        Assessment and Plan: Nancy Porter is a 52 year old Caucasian female with history of depression, anxiety, functional neurological symptoms disorder, migraine headaches, continues to have anxiety and  depression symptoms although improving.  Patient also with psychosocial stressors, relationship struggles, seizure-like spells and chronic pain, will benefit from medication management, psychotherapy sessions as well as abstinence from cannabis, illicit drugs and alcohol.  Discussed plan as noted below.  Plan MDD-improving Sertraline 75 mg p.o. daily Nortriptyline 50 mg p.o. nightly.  Prescribed by neurology for headaches. Patient was started on Abilify 2 mg p.o. daily however she has not started taking the medication-pending-I have communicated with staff to reach out to pharmacy. Discussed with establishing care with therapist again.  GAD-improving Sertraline 75 mg p.o. daily Nortriptyline 50 mg p.o. daily-1 neurology Clonazepam 0.5 mg p.o. twice daily-per primary care provider. Discussed with patient that we will not be taking over her benzodiazepine prescription at this time due to comorbid cannabis use.  Discussed clinic policy, need for urine drug screen repeat.  I will not recommend long-term benzodiazepine use either and will recommend tapering it off.  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  Cocaine use disorder in remission/history of alcoholism/current use of cannabis-labs reviewed-urine drug screen positive for cannabis-12/12/2022.  Provided education.  Patient to stay away from cannabis and other illicit drugs.  I have reviewed labs-12/12/2022-hemoglobin A1c-elevated at 5.8, lipid panel-cholesterol elevated  at 289, LDL elevated at 211, sodium-within normal limits. EKG-12/12/2022-normal sinus rhythm-QTc 464.     Collaboration of Care: Collaboration of Care: Referral or follow-up with counselor/therapist AEB patient encouraged establish care with therapist.  Patient/Guardian was advised Release of Information must be obtained prior  to any record release in order to collaborate their care with an outside provider. Patient/Guardian was advised if they have not already done so to contact the registration department to sign all necessary forms in order for [place] to release information regarding their care.   Consent: Patient/Guardian gives verbal consent for treatment and assignment of benefits for services provided during this visit. Patient/Guardian expressed understanding and agreed to proceed.   Follow-up in clinic in office in 6 weeks or sooner if needed.   This note was generated in part or whole with voice recognition software. Voice recognition is usually quite accurate but there are transcription errors that can and very often do occur. I apologize for any typographical errors that were not detected and corrected.    Jomarie Longs, MD 01/10/2023, 6:28 AM

## 2023-01-09 NOTE — Telephone Encounter (Signed)
Please let me know once you find out

## 2023-01-09 NOTE — Telephone Encounter (Signed)
medicaid needs a safety document sent in. (not sure what that is)and they had to order and it should be in tomorrow.

## 2023-01-09 NOTE — Telephone Encounter (Signed)
Patient states her pharmacy -[facility] [address] sent [place] a request for further information for why she is taking Abilify and that her insurance will not cover it without that. I do not see any record of that.

## 2023-01-30 ENCOUNTER — Telehealth: Payer: Self-pay

## 2023-01-30 DIAGNOSIS — F331 Major depressive disorder, recurrent, moderate: Secondary | ICD-10-CM

## 2023-02-04 MED ORDER — ARIPIPRAZOLE 2 MG PO TABS: 2.0000 mg | ORAL_TABLET | Freq: Every day | ORAL | 0 refills | Status: DC

## 2023-02-04 NOTE — Telephone Encounter (Signed)
[facility] for the following medication and to send in a 90 day supply to satisfy the patients insurance please advise  ARIPiprazole (ABILIFY) 2 MG tablet   Last visit 01/09/23 Next visit 02/25/23  [facility]  [id number] [address], [place] [address] Phone: [phone]  Fax: [phone]

## 2023-02-04 NOTE — Telephone Encounter (Signed)
I have sent Abilify 2 mg daily to pharmacy-[facility] days supply.  [facility]

## 2023-02-06 ENCOUNTER — Other Ambulatory Visit: Payer: Self-pay

## 2023-02-14 ENCOUNTER — Ambulatory Visit: Payer: Medicaid Other | Admitting: Neurology

## 2023-02-25 ENCOUNTER — Telehealth: Payer: Medicaid Other | Admitting: Psychiatry

## 2023-02-28 ENCOUNTER — Other Ambulatory Visit (HOSPITAL_COMMUNITY): Payer: Self-pay

## 2023-02-28 ENCOUNTER — Telehealth: Payer: Self-pay

## 2023-02-28 NOTE — Telephone Encounter (Signed)
*  [facility]  Pharmacy Patient Advocate Encounter  Received notification from [facility] that Prior Authorization for Aimovig 140MG /ML auto-injectors  has been APPROVED from 02/28/2023 to 05/28/2023. Ran test claim, Copay is $plan limits exceeded-may need DUR. This test claim was processed through [facility]- copay amounts may vary at other pharmacies due to pharmacy/plan contracts, or as the patient moves through the different stages of their insurance plan.   PA #/Case ID/Reference #: Patrecia Pour

## 2023-03-05 ENCOUNTER — Other Ambulatory Visit: Payer: Self-pay

## 2023-03-08 ENCOUNTER — Other Ambulatory Visit: Payer: Self-pay

## 2023-03-08 NOTE — Progress Notes (Signed)
Specialty Pharmacy Refill Coordination Note  Nancy Porter is a 52 y.o. female contacted today regarding refills of specialty medication(s) No data recorded  Patient requested Delivery   Delivery date: 03/18/23   Verified address: [facility] [address] [place] [address]   Medication will be filled on 03/15/23.

## 2023-03-20 ENCOUNTER — Ambulatory Visit: Payer: Medicaid Other | Admitting: Neurology

## 2023-03-20 VITALS — BP 98/64 | HR 72

## 2023-03-20 DIAGNOSIS — G43709 Chronic migraine without aura, not intractable, without status migrainosus: Secondary | ICD-10-CM

## 2023-03-20 MED ORDER — ONABOTULINUMTOXINA 200 UNITS IJ SOLR
155.0000 [IU] | Freq: Once | INTRAMUSCULAR | Status: AC
  Administered 2023-03-20: 155 [IU] via INTRAMUSCULAR

## 2023-03-20 NOTE — Progress Notes (Signed)
   BOTOX PROCEDURE NOTE FOR MIGRAINE HEADACHE  HISTORY: Nancy Porter is here for Botox.  Last was 12/13/2022 with me.  This will be her fourth Botox injection. Insurance approved her AImovig!! Only $4 a month. Went from [employer] without the [employer], she just got it. Horrible migraines, has daily headache, 4 days are severe. Has described 2 events of falling for unknown reason? Never started Abilify, worried about side effects. Remains on nortriptyline 50 mg at bedtime, topamax 100 mg twice daily.   Description of procedure:  The patient was placed in a sitting position. The standard protocol was used for Botox as follows, with 5 units of Botox injected at each site:   -Procerus muscle, midline injection  -Corrugator muscle, bilateral injection  -Frontalis muscle, bilateral injection, with 2 sites each side, medial injection was performed in the upper one third of the frontalis muscle, in the region vertical from the medial inferior edge of the superior orbital rim. The lateral injection was again in the upper one third of the forehead vertically above the lateral limbus of the cornea, 1.5 cm lateral to the medial injection site.  -Temporalis muscle injection, 4 sites, bilaterally. The first injection was 3 cm above the tragus of the ear, second injection site was 1.5 cm to 3 cm up from the first injection site in line with the tragus of the ear. The third injection site was 1.5-3 cm forward between the first 2 injection sites. The fourth injection site was 1.5 cm posterior to the second injection site.  -Occipitalis muscle injection, 3 sites, bilaterally. The first injection was done one half way between the occipital protuberance and the tip of the mastoid process behind the ear. The second injection site was done lateral and superior to the first, 1 fingerbreadth from the first injection. The third injection site was 1 fingerbreadth superiorly and medially from the first injection site.  -Cervical  paraspinal muscle injection, 2 sites, bilateral, the first injection site was 1 cm from the midline of the cervical spine, 3 cm inferior to the lower border of the occipital protuberance. The second injection site was 1.5 cm superiorly and laterally to the first injection site.  -Trapezius muscle injection was performed at 3 sites, bilaterally. The first injection site was in the upper trapezius muscle halfway between the inflection point of the neck, and the acromion. The second injection site was one half way between the acromion and the first injection site. The third injection was done between the first injection site and the inflection point of the neck.  I did 5 units to right and left masseters.  A 200 unit bottle of Botox was used, 165 units were injected, the rest of the Botox was wasted. The patient tolerated the procedure well, there were no complications of the above procedure.  Botox NDC 6213-0865-78 Lot number [id number] Expiration date 12/2024 SP

## 2023-03-20 NOTE — Progress Notes (Signed)
Botox- 200 units x 1 vial Lot: [id number] Expiration: 2026/9 NDC: 0023-3921-02  Bacteriostatic 0.9% Sodium Chloride- 4 mL  Lot: [id number] Expiration: 07/16/23 NDC: 8416-6063-01  Dx:  S01.093  S/P  Witnessed by DIAMOND CMA

## 2023-04-03 ENCOUNTER — Encounter: Payer: Self-pay | Admitting: Neurology

## 2023-04-03 ENCOUNTER — Ambulatory Visit: Payer: Medicaid Other | Admitting: Neurology

## 2023-04-03 VITALS — BP 104/64 | HR 64 | Ht 67.0 in | Wt 164.0 lb

## 2023-04-03 DIAGNOSIS — R569 Unspecified convulsions: Secondary | ICD-10-CM | POA: Diagnosis not present

## 2023-04-03 DIAGNOSIS — R0681 Apnea, not elsewhere classified: Secondary | ICD-10-CM | POA: Diagnosis not present

## 2023-04-03 DIAGNOSIS — G4719 Other hypersomnia: Secondary | ICD-10-CM

## 2023-04-03 DIAGNOSIS — R519 Headache, unspecified: Secondary | ICD-10-CM

## 2023-04-03 DIAGNOSIS — G43711 Chronic migraine without aura, intractable, with status migrainosus: Secondary | ICD-10-CM
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
## 2023-04-03 NOTE — Patient Instructions (Addendum)
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 I would slowly increase to 200mg  a day and she can cpntact [place] to start or discuss with psychiatry (Lamictal is used for mood, seizures and migraines)  Sleep Apnea  Sleep apnea is a condition that affects your breathing while you are sleeping. Your tongue or soft tissue in your throat may block the flow of air while you sleep. You may have shallow breathing or stop breathing for short periods of time. People with sleep apnea may snore loudly. There are three kinds of sleep apnea: Obstructive sleep apnea. This kind is caused by a blocked or collapsed airway. This is the most common. Central sleep apnea. This kind happens when the part of the brain that controls breathing does not send the correct signals to the muscles that control breathing. Mixed sleep apnea. This is a combination of obstructive and central sleep apnea. What are the causes? The most common cause of sleep apnea is a collapsed or blocked airway. What increases the risk? Being very overweight. Having family members with sleep apnea. Having a tongue or tonsils that are larger than normal. Having a small airway or jaw problems. Being older. What are the signs or symptoms? Loud snoring. Restless sleep. Trouble staying asleep. Being sleepy or tired during the day. Waking up gasping or choking. Having a headache in the morning. Mood swings. Having a hard time remembering things and concentrating. How is this diagnosed? A medical history. A physical exam. A sleep study. This is also called a polysomnography test. This test is done at a sleep lab or in your home while you are sleeping. How is this treated? Treatment may include: Sleeping on  your side. Losing weight if you're overweight. Wearing an oral appliance. This is a mouthpiece that moves your lower jaw forward. Using a positive airway pressure (PAP) device to keep your airways open while you sleep, such as: A continuous positive airway pressure (CPAP) device. This device gives forced air through a mask when you breathe out. This keeps your airways open. A bilevel positive airway pressure (BIPAP) device. This device gives forced air through a mask when you breathe in and when you breathe out to keep your airways open. Having surgery if other treatments do not work. If your sleep apnea is not treated, you may be at risk for: Heart failure. Heart attack. Stroke. Type 2 diabetes or a problem with your blood sugar called insulin resistance. Follow these instructions at home: Medicines Take your medicines only as told by your health care provider. Avoid alcohol, medicines to help you relax, and certain pain medicines. These may make sleep apnea worse. General instructions Do not smoke, vape, or use products with nicotine or tobacco in them. If you need help quitting, talk with your provider. If you were given a PAP device to open your airway while you sleep, use it as told by your provider. If you're having surgery, make sure to tell your provider you have sleep apnea. You may need to bring your PAP device with you. Contact a health care provider if: The PAP device that you were given to use during sleep bothers you or does not seem to be working. You do not feel better or you feel worse. Get help right away if: You  have trouble breathing. You have chest pain. You have trouble talking. One side of your body feels weak. A part of your face is hanging down. These symptoms may be an emergency. Call 911 right away. Do not wait to see if the symptoms will go away. Do not drive yourself to the hospital. This information is not intended to replace advice given to you by your  health care provider. Make sure you discuss any questions you have with your health care provider. Document Revised: 06/07/2022 Document Reviewed: 06/07/2022 Elsevier Patient Education  2024 [employer].

## 2023-04-08 ENCOUNTER — Telehealth: Payer: Self-pay | Admitting: Neurology

## 2023-04-08 ENCOUNTER — Other Ambulatory Visit: Payer: Self-pay | Admitting: Specialist

## 2023-04-08 DIAGNOSIS — J439 Emphysema, unspecified: Secondary | ICD-10-CM

## 2023-04-08 DIAGNOSIS — F17210 Nicotine dependence, cigarettes, uncomplicated: Secondary | ICD-10-CM

## 2023-04-08 NOTE — Telephone Encounter (Addendum)
Referral for neurology fax to [facility] to see Dr.  [name]. Also sent through [facility]. Phone:  [phone], Fax: [phone]

## 2023-04-23 ENCOUNTER — Encounter: Payer: Self-pay | Admitting: Neurology

## 2023-04-24 NOTE — Telephone Encounter (Signed)
 Contacted [facility], referral coordinator had not received the referral through [facility]. Faxed referral to [phone], Phone: [phone]

## 2023-04-25 ENCOUNTER — Other Ambulatory Visit: Payer: Self-pay | Admitting: Specialist

## 2023-04-25 DIAGNOSIS — J439 Emphysema, unspecified: Secondary | ICD-10-CM

## 2023-04-25 DIAGNOSIS — F17210 Nicotine dependence, cigarettes, uncomplicated: Secondary | ICD-10-CM

## 2023-04-29 ENCOUNTER — Ambulatory Visit: Payer: Medicaid Other | Admitting: Neurology

## 2023-04-29 ENCOUNTER — Encounter: Payer: Self-pay | Admitting: Neurology

## 2023-04-29 VITALS — BP 99/67 | HR 68 | Ht 67.0 in | Wt 167.8 lb

## 2023-04-29 DIAGNOSIS — R000 Tachycardia, unspecified: Secondary | ICD-10-CM | POA: Diagnosis not present

## 2023-04-29 DIAGNOSIS — F445 Conversion disorder with seizures or convulsions: Secondary | ICD-10-CM

## 2023-04-29 DIAGNOSIS — G43711 Chronic migraine without aura, intractable, with status migrainosus: Secondary | ICD-10-CM | POA: Diagnosis not present

## 2023-04-29 DIAGNOSIS — G4711 Idiopathic hypersomnia with long sleep time: Secondary | ICD-10-CM

## 2023-04-29 DIAGNOSIS — Z720 Tobacco use: Secondary | ICD-10-CM

## 2023-04-29 NOTE — Patient Instructions (Signed)
 Screening for Sleep Apnea  Sleep apnea is a condition in which breathing pauses or becomes shallow during sleep. Sleep apnea screening is a test to determine if you are at risk for sleep apnea. The test includes a series of questions. It will only takes a few minutes. Your health care provider may ask you to have this test in preparation for surgery or as part of a physical exam. What are the symptoms of sleep apnea? Common symptoms of sleep apnea include: Snoring. Waking up often at night. Daytime sleepiness. Pauses in breathing. Choking or gasping during sleep. Irritability. Forgetfulness. Trouble thinking clearly. Depression. Personality changes. Most people with sleep apnea do not know that they have it. What are the advantages of sleep apnea screening? Getting screened for sleep apnea can help: Ensure your safety. It is important for your health care providers to know whether or not you have sleep apnea, especially if you are having surgery or have other long-term (chronic) health conditions. Improve your health and allow you to get a better night's rest. Restful sleep can help you: Have more energy. Lose weight. Improve high blood pressure. Improve diabetes management. Prevent stroke. Prevent car accidents. What happens during the screening? Screening usually includes being asked a list of questions about your sleep quality. Some questions you may be asked include: Do you snore? Is your sleep restless? Do you have daytime sleepiness? Has a partner or spouse told you that you stop breathing during sleep? Have you had trouble concentrating or memory loss? What is your age? What is your neck circumference? To measure your neck, keep your back straight and gently wrap the tape measure around your neck. Put the tape measure at the middle of your neck, between your chin and collarbone. What is your sex assigned at birth? Do you have or are you being treated for high blood  pressure? If your screening test is positive, you are at risk for the condition. Further testing may be needed to confirm a diagnosis of sleep apnea. Where to find more information You can find screening tools online or at your health care clinic. For more information about sleep apnea screening and healthy sleep, visit these websites: Centers for Disease Control and Prevention: footballexhibition.com.br American Sleep Apnea Association: www.sleepapnea.org Contact a health care provider if: You think that you may have sleep apnea. Summary Sleep apnea screening can help determine if you are at risk for sleep apnea. It is important for your health care providers to know whether or not you have sleep apnea, especially if you are having surgery or have other chronic health conditions. You may be asked to take a screening test for sleep apnea in preparation for surgery or as part of a physical exam. This information is not intended to replace advice given to you by your health care provider. Make sure you discuss any questions you have with your health care provider. Document Revised: 03/11/2020 Document Reviewed: 03/11/2020 Elsevier Patient Education  2024 [employer] Inc[employer]               Functional Neurologic Disorder Functional neurologic disorder (FND) is a condition that affects how your brain works. It can cause symptoms such as a seizure or a stroke even if you do not have an injury to your brain, spinal cord, or nerves (nervous system). In FND, there is a problem with how your brain and body send and receive signals. The symptoms are real and may affect your quality of life. FND is also called  conversion disorder. Your care may be managed by a team of health care providers who focus on treating FND. Your team may include an expert in treating the nervous system (neurologist), a mental health specialist, and rehab therapists. What are the causes? The exact cause of FND is not known. Some factors  that may affect brain function include: Your brain trying to stop pain. Your brain turning off part or all of the body when it senses a threat. What increases the risk? You are more likely to get FND if: You have other conditions. These include chronic pain, epilepsy, and multiple sclerosis. You have a mental health condition, such as depression, anxiety, or post-traumatic stress disorder (PTSD). You are under a lot of stress. You were abused as a child. What are the signs or symptoms? Symptoms of FND may include: Your muscles becoming weak or no longer working (paralysis). Shaking that you cannot control and that looks like seizures (functional seizures). Tremors, jerks, or sudden muscle tightening (spasms). Loss of balance or trouble walking. Trouble swallowing or feeling like you have a lump in your throat. Stuttering or losing your voice. No longer being able to feel pain or fully losing your sense of touch. Other symptoms may include: Changes in vision. These may include blindness or double vision. Changes in hearing, such as deafness. Seeing or hearing things that are not real (hallucinations). In most cases, symptoms occur all of a sudden. They often appear around times of intense stress. How is this diagnosed? FND is diagnosed based on an exam by your provider. You will also be checked for other health problems based on your symptoms. You may have: A test to look for problems with your nervous system (neurological exam). Blood and pee (urine) tests. X-rays or other imaging studies. An electroencephalogram (EEG). This is done to look for seizures. How is this treated? Treatment will be based on your symptoms. It may include: Techniques to help you relax. These may include yoga, meditation, and exercise. Cognitive behavioral therapy (CBT). This can help you: Learn ways to self-calm. Find out what triggers your symptoms. Decide how you want to think, feel, and act when things  cause you stress. Accept what you feel and learn what you would like to change. You can then work toward making the change. This type of CBT is called acceptance and commitment therapy (ACT). Physical therapy (PT). PT can help you with walking if your strength and the way that you walk (gait) have been affected. Occupational therapy (OT). OT can help you relearn how to do daily tasks. Speech therapy. This can help if you have speech and swallowing problems. Medicine. Antidepressants may help even if you are not depressed. Follow these instructions at home:  Take over-the-counter and prescription medicines only as told by your provider. Stay away from caffeine, alcohol, and certain cold medicines. These can trigger FND. Use stress management. Work with your provider to find ways to reduce the stress in your life. Think about joining a support group. Keep all follow-up visits. Therapies may help retrain your brain so that the symptoms go away. Where to find more information FND Hope: fndhope.org Contact a health care provider if: Your symptoms do not go away or they get worse. You get new symptoms. You feel frustrated and hopeless about your FND. Get help right away if: You feel like you may hurt yourself or others, or have thoughts about taking your own life. Go to your nearest emergency room or: Call 911. Call  the National Suicide Prevention Lifeline at 561-669-2016 or 988. This is open 24 hours a day. Text the Crisis Text Line at (916) 642-9208. This information is not intended to replace advice given to you by your health care provider. Make sure you discuss any questions you have with your health care provider. Document Revised: 12/21/2021 Document Reviewed: 12/21/2021 Elsevier Patient Education  2024 [employer].
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
## 2023-04-30 ENCOUNTER — Telehealth: Payer: Self-pay | Admitting: Neurology

## 2023-04-30 NOTE — Telephone Encounter (Signed)
 HST [facility] [employer] pending

## 2023-05-02 NOTE — Telephone Encounter (Signed)
HST- [facility] [employer] no auth req via fax form

## 2023-05-03 ENCOUNTER — Ambulatory Visit
Admission: RE | Admit: 2023-05-03 | Discharge: 2023-05-03 | Disposition: A | Discharge status: Home / Self Care | Payer: Medicaid Other | Source: Ambulatory Visit | Attending: Specialist | Admitting: Specialist

## 2023-05-03 DIAGNOSIS — F17210 Nicotine dependence, cigarettes, uncomplicated: Secondary | ICD-10-CM | POA: Diagnosis present

## 2023-05-03 DIAGNOSIS — J439 Emphysema, unspecified: Secondary | ICD-10-CM | POA: Diagnosis present

## 2023-05-16 ENCOUNTER — Telehealth: Payer: Self-pay | Admitting: *Deleted

## 2023-05-16 ENCOUNTER — Telehealth: Payer: Self-pay | Admitting: Neurology

## 2023-05-16 NOTE — Telephone Encounter (Signed)
Spoke to Megan,NP forward to debra to see the form that is faxed  if we are able to release any pt information

## 2023-05-16 NOTE — Telephone Encounter (Signed)
I spoke to [facility] @ [employer] to let her all forms take up to 14 business days to  complete and that we will review the form and give a call next week.

## 2023-05-16 NOTE — Telephone Encounter (Addendum)
[employer] [facility]) called to check status of form fax over on 05/10/23 for disability case. Medical Statement Form explaining  diagnosis an answering pertaining to type of headaches patient gets. Informed Jeanice Lim we must have a medical release signed by the patient. Jeanice Lim said faxing over release of information with Medical Statement form

## 2023-05-28 ENCOUNTER — Encounter: Payer: Self-pay | Admitting: Neurology

## 2023-05-28 ENCOUNTER — Ambulatory Visit: Payer: Medicaid Other | Admitting: Neurology

## 2023-05-28 DIAGNOSIS — Z720 Tobacco use: Secondary | ICD-10-CM

## 2023-05-28 DIAGNOSIS — G43711 Chronic migraine without aura, intractable, with status migrainosus: Secondary | ICD-10-CM

## 2023-05-28 DIAGNOSIS — R000 Tachycardia, unspecified: Secondary | ICD-10-CM

## 2023-05-28 DIAGNOSIS — G4711 Idiopathic hypersomnia with long sleep time: Secondary | ICD-10-CM

## 2023-05-28 DIAGNOSIS — F445 Conversion disorder with seizures or convulsions: Secondary | ICD-10-CM

## 2023-05-30 DIAGNOSIS — Z0289 Encounter for other administrative examinations: Secondary | ICD-10-CM

## 2023-06-04 NOTE — Telephone Encounter (Signed)
 Form faxed back to [employer]. Received a receipt of confirmation. Fax # is [phone].

## 2023-06-05 ENCOUNTER — Other Ambulatory Visit: Payer: Self-pay

## 2023-06-05 ENCOUNTER — Other Ambulatory Visit: Payer: Self-pay | Admitting: Neurology

## 2023-06-05 MED ORDER — BOTOX 200 UNITS IJ SOLR
INTRAMUSCULAR | 3 refills | Status: AC
  Filled 2023-06-05: qty 1, 84d supply, fill #0
  Filled 2023-09-12: qty 1, 84d supply, fill #1

## 2023-06-05 NOTE — Progress Notes (Signed)
 Specialty Pharmacy Refill Coordination Note  Nancy Porter is a 53 y.o. female contacted today regarding refills of specialty medication(s) OnabotulinumtoxinA (BOTOX)   Patient requested Courier to Provider Office   Delivery date: 06/19/23   Verified address: [facility] [address]   Medication will be filled on 03.04.25.

## 2023-06-11 ENCOUNTER — Ambulatory Visit: Payer: Medicaid Other | Admitting: Neurology

## 2023-06-11 DIAGNOSIS — G4710 Hypersomnia, unspecified: Secondary | ICD-10-CM

## 2023-06-11 DIAGNOSIS — G43711 Chronic migraine without aura, intractable, with status migrainosus: Secondary | ICD-10-CM

## 2023-06-11 DIAGNOSIS — F445 Conversion disorder with seizures or convulsions: Secondary | ICD-10-CM

## 2023-06-11 DIAGNOSIS — Z720 Tobacco use: Secondary | ICD-10-CM

## 2023-06-11 DIAGNOSIS — G4711 Idiopathic hypersomnia with long sleep time: Secondary | ICD-10-CM

## 2023-06-11 DIAGNOSIS — R000 Tachycardia, unspecified: Secondary | ICD-10-CM

## 2023-06-12 NOTE — Progress Notes (Signed)
 [facility]   HOME SLEEP TEST REPORT ( by Watch PAT)   STUDY DATE:   06-12-2023  Nancy Porter 53 year old female Nancy Porter, Nancy Porter  ORDERING CLINICIAN:  REFERRING CLINICIAN:  Dr Lucia Gaskins, MD and Margie Ege, NP    CLINICAL INFORMATION/HISTORY: Chronic Migraine patient.  Pulmonary emphysema followed by pulmonology.   diagnosed with Functional Neurologic Disorder and PNES. She starts feeling a spasm on the right her face will spasm. Within 5 minutes she is "full blown" she locks up, her whole face is distorted and she doesn't remember a thing. She is extremely fatigued after and takes a few days. The last time she turned, that is all she remembers, husband provides information she heard the fall is so hard and he looked over and she was on the ground, I think she needs EMU with the symptoms above sound epileptogenic, she has had heart monitor and long-term eeg 72 hours but did not have an episode during that. We will refer to [name] for evaluation and EMU monitoring oer his clinical judgement.    Aimovig and emgality have been great. But she still has headaches. No significant snoring. But husband says she wakes up gasping for breath like she can't breath, she is choking. She wakes up with morning headache every day. She is excessively tired by 1pm she wants a nap. Discussed sleep apnea.    Send you to [name], MD [facility] epilepsy specialist and EMU Unit Continue Aimovig and Botox Need a sleep evaluation Nancy Porter is a 53 y.o. Caucasian female patient who is seen upon referral on 04/29/2023 via Dr Lucia Gaskins for a sleep apnea evaluation .  Chief concern according to patient :  I have chronic migraines, followed by Dr. Lucia Gaskins and [name]. I take Aimovig,  Nortriptyline , Topiramate, Botox injections.  I have still daily headaches, 4 days out of 7 I will have a migraine. Sometimes I wake up with headaches, sometimes I am woken up by headaches. My whole head feels as  if it could explode, pressure. The sharp headaches, stabbing and electric jolt like headaches, these have stopped". I would smell burning , olfactory aura- this was helped by Nortriptyline. "     Epworth sleepiness score: 14/ 24 points   FSS endorsed at 55/ 63 points.    "I have no joy, no energy"    BMI: 26.2  kg/m   Neck Circumference: 15"   FINDINGS:   Sleep Summary:   Total Recording Time (hours, min):      10 h 24 m   Total Sleep Time (hours, min):    9 h 0 m             Percent REM (%):   19%                                      Respiratory Indices:   Calculated pAHI (per hour):     following AASM criteria, AHI was 3.5/h overall                         REM pAHI:     4.2/h  NREM pAHI:   3.3/h                            Positional AHI:  non supine sleep was associated with an AHI of only 2.5/h and supine sleep with an AHI of 6/h. See graph below.   Snoring reached threshold of detection level, 40 db mean volume.                                                  Oxygen Saturation Statistics:   Oxygen Saturation (%) Mean:     94%          O2 Saturation Range (%):      Between 89 and 98%                                  O2 Saturation (minutes) <89%:        0 m   Pulse Rate Statistics:   Pulse Mean (bpm):     61 bpm             Pulse Range:    between 49 and 89 bpm           IMPRESSION:  This HST did not detect sleep apnea and neither sleep hypoxia.  Snoring was mild at best.    RECOMMENDATION: none.     INTERPRETING PHYSICIAN:   Melvyn Novas, MD

## 2023-06-14 ENCOUNTER — Other Ambulatory Visit (HOSPITAL_COMMUNITY): Payer: Self-pay

## 2023-06-17 ENCOUNTER — Encounter: Payer: Self-pay | Admitting: Neurology

## 2023-06-17 NOTE — Procedures (Signed)
 [facility]   HOME SLEEP TEST REPORT ( by Watch PAT)   STUDY DATE:   06-12-2023  Nancy Porter 53 year old female July 21, 1970  ORDERING CLINICIAN:  REFERRING CLINICIAN:  Dr Nancy Gaskins, MD and Nancy Ege, NP    CLINICAL INFORMATION/HISTORY: Chronic Migraine patient.  Pulmonary emphysema followed by pulmonology.   diagnosed with Functional Neurologic Disorder and PNES. She starts feeling a spasm on the right her face will spasm. Within 5 minutes she is "full blown" she locks up, her whole face is distorted and she doesn't remember a thing. She is extremely fatigued after and takes a few days. The last time she turned, that is all she remembers, husband provides information she heard the fall is so hard and he looked over and she was on the ground, I think she needs EMU with the symptoms above sound epileptogenic, she has had heart monitor and long-term eeg 72 hours but did not have an episode during that. We will refer to Nancy Porter for evaluation and EMU monitoring oer his clinical judgement.    Aimovig and emgality have been great. But she still has headaches. No significant snoring. But husband says she wakes up gasping for breath like she can't breath, she is choking. She wakes up with morning headache every day. She is excessively tired by 1pm she wants a nap. Discussed sleep apnea.    Send you to Nancy Cooper, MD [facility] epilepsy specialist and EMU Unit Continue Aimovig and Botox Need a sleep evaluation Nancy Porter is a 53 y.o. Caucasian female patient who is seen upon referral on 04/29/2023 via Dr Nancy Porter for a sleep apnea evaluation .  Chief concern according to patient :  I have chronic migraines, followed by Dr. Lucia Porter and Nancy Porter. I take Aimovig,  Nortriptyline , Topiramate, Botox injections.  I have still daily headaches, 4 days out of 7 I will have a migraine. Sometimes I wake up with headaches, sometimes I am woken up by headaches. My whole head feels as  if it could explode, pressure. The sharp headaches, stabbing and electric jolt like headaches, these have stopped". I would smell burning , olfactory aura- this was helped by Nortriptyline. "     Epworth sleepiness score: 14/ 24 points   FSS endorsed at 54/ 63 points.    "I have no joy, no energy"    BMI: 26.2  kg/m   Neck Circumference: 15"   FINDINGS:   Sleep Summary:   Total Recording Time (hours, min):      10 h 24 m   Total Sleep Time (hours, min):    9 h 0 m             Percent REM (%):   19%                                      Respiratory Indices:   Calculated pAHI (per hour):     following AASM criteria, AHI was 3.5/h overall                         REM pAHI:     4.2/h  NREM pAHI:   3.3/h                            Positional AHI:  non supine sleep was associated with an AHI of only 2.5/h and supine sleep with an AHI of 6/h. See graph below.   Snoring reached threshold of detection level, 40 db mean volume.                                                  Oxygen Saturation Statistics:   Oxygen Saturation (%) Mean:     94%          O2 Saturation Range (%):      Between 89 and 98%                                  O2 Saturation (minutes) <89%:        0 m   Pulse Rate Statistics:   Pulse Mean (bpm):     61 bpm             Pulse Range:    between 49 and 89 bpm           IMPRESSION:  This HST did not detect sleep apnea and neither sleep hypoxia.  Snoring was mild at best.    RECOMMENDATION: none.     INTERPRETING PHYSICIAN:   Nancy Novas, MD

## 2023-06-18 ENCOUNTER — Other Ambulatory Visit: Payer: Self-pay

## 2023-06-25 ENCOUNTER — Encounter: Payer: Self-pay | Admitting: Neurology

## 2023-06-25 ENCOUNTER — Telehealth: Payer: Self-pay

## 2023-06-25 ENCOUNTER — Ambulatory Visit: Payer: Medicaid Other | Admitting: Neurology

## 2023-06-25 ENCOUNTER — Other Ambulatory Visit (HOSPITAL_COMMUNITY): Payer: Self-pay

## 2023-06-25 VITALS — BP 116/72 | HR 70 | Ht 67.0 in | Wt 165.8 lb

## 2023-06-25 DIAGNOSIS — G43711 Chronic migraine without aura, intractable, with status migrainosus: Secondary | ICD-10-CM | POA: Diagnosis not present

## 2023-06-25 MED ORDER — ONABOTULINUMTOXINA 200 UNITS IJ SOLR
155.0000 [IU] | Freq: Once | INTRAMUSCULAR | Status: AC
  Administered 2023-06-25: 165 [IU] via INTRAMUSCULAR

## 2023-06-25 MED ORDER — AIMOVIG 140 MG/ML ~~LOC~~ SOAJ
1.0000 mL | SUBCUTANEOUS | 11 refills | Status: DC
Start: 2023-06-25 — End: 2023-07-16

## 2023-06-25 NOTE — Telephone Encounter (Signed)
 PA request has been Submitted. New Encounter has been or will be created for follow up. For additional info see Pharmacy Prior Auth telephone encounter from 06-25-2023.

## 2023-06-25 NOTE — Progress Notes (Signed)
 Botox- 200 units x 1 vial Lot: [id number] Expiration: 07/2025 NDC: 9147-8295-62  Bacteriostatic 0.9% Sodium Chloride- * mL  Lot: [id number] Expiration: 02/15/2024 NDC: 7846-9629-52  Dx: W41.324 S/P  Witnessed by April Jones, RN

## 2023-06-25 NOTE — Telephone Encounter (Signed)
 Pharmacy Patient Advocate Encounter   Received notification from Patient Advice Request messages that prior authorization for Aimovig 140MG /ML auto-injectors is required/requested.   Insurance verification completed.   The patient is insured through [facility] .   Per test claim: PA required; PA submitted to above mentioned insurance via CoverMyMeds Key/confirmation #/EOC [id number] Status is pending

## 2023-06-25 NOTE — Progress Notes (Signed)
 BOTOX PROCEDURE NOTE FOR MIGRAINE HEADACHE  HISTORY: Nancy Porter is here for Botox.  Last was 03/20/2023 with me.  This will be her fifth Botox injection.  Saw Dr. Vickey Huger for sleep consultation, HST did not detect any sleep apnea or hypoxia. Remains on Aimovig 140 mg, nortriptyline 50 mg at bedtime, topamax 100 mg BID. Takes Ubrelvy PRN. Is late taking Aimovig 3/4 was due. Reports [facility] telling her that needs a PA. Dr. Lucia Gaskins referred her to Dr. [name] epilepsy specialist at [facility], seeing in May. Mentions "horrible" migraines. Has been having ice pick, cold sensation to right side of her head. Continues with daily migraine, 4 a week are severe? Botox helps to keep from daily severe headache, does best few weeks after Botox, still daily headache, but 3 are still severe, overall makes it bearable.  Description of procedure:  The patient was placed in a sitting position. The standard protocol was used for Botox as follows, with 5 units of Botox injected at each site:  -Procerus muscle, midline injection  -Corrugator muscle, bilateral injection  -Frontalis muscle, bilateral injection, with 2 sites each side, medial injection was performed in the upper one third of the frontalis muscle, in the region vertical from the medial inferior edge of the superior orbital rim. The lateral injection was again in the upper one third of the forehead vertically above the lateral limbus of the cornea, 1.5 cm lateral to the medial injection site.  -Temporalis muscle injection, 4 sites, bilaterally. The first injection was 3 cm above the tragus of the ear, second injection site was 1.5 cm to 3 cm up from the first injection site in line with the tragus of the ear. The third injection site was 1.5-3 cm forward between the first 2 injection sites. The fourth injection site was 1.5 cm posterior to the second injection site.  -Occipitalis muscle injection, 3 sites, bilaterally. The first injection was done one  half way between the occipital protuberance and the tip of the mastoid process behind the ear. The second injection site was done lateral and superior to the first, 1 fingerbreadth from the first injection. The third injection site was 1 fingerbreadth superiorly and medially from the first injection site.  -Cervical paraspinal muscle injection, 2 sites, bilateral, the first injection site was 1 cm from the midline of the cervical spine, 3 cm inferior to the lower border of the occipital protuberance. The second injection site was 1.5 cm superiorly and laterally to the first injection site.  -Trapezius muscle injection was performed at 3 sites, bilaterally. The first injection site was in the upper trapezius muscle halfway between the inflection point of the neck, and the acromion. The second injection site was one half way between the acromion and the first injection site. The third injection was done between the first injection site and the inflection point of the neck.  I did 5 units to right and left masseter.  A 200 unit bottle of Botox was used, 165 units were injected, the rest of the Botox was wasted. The patient tolerated the procedure well, there were no complications of the above procedure.  Botox NDC 0023-3921-02 Lot number [id number] Expiration date 07/2025 SP  Meds ordered this encounter  Medications   Erenumab-aooe (AIMOVIG) 140 MG/ML SOAJ    Sig: Inject 140 mg into the skin every 30 (thirty) days.    Dispense:  1 mL    Refill:  11   botulinum toxin Type A (BOTOX) injection 155  Units    Botox- 200 units x 1 vial Lot: [id number] Expiration: 07/2025 NDC: 8295-6213-08  Bacteriostatic 0.9% Sodium Chloride- * mL  Lot: [id number] Expiration: 02/15/2024 NDC: 9629-5284-13  Dx: K44.010 S/P    I refilled her Aimovig. She will check with pharmacy, if PA needed, will let me know ASAP so we can expedite the appeal.

## 2023-07-08 ENCOUNTER — Other Ambulatory Visit (HOSPITAL_COMMUNITY): Payer: Self-pay

## 2023-07-16 ENCOUNTER — Other Ambulatory Visit: Payer: Self-pay

## 2023-07-16 ENCOUNTER — Emergency Department

## 2023-07-16 ENCOUNTER — Encounter: Payer: Self-pay | Admitting: Intensive Care

## 2023-07-16 ENCOUNTER — Inpatient Hospital Stay
Admission: EM | Admit: 2023-07-16 | Discharge: 2023-07-20 | DRG: 392 | Disposition: A | Discharge status: Home / Self Care | Attending: Osteopathic Medicine | Admitting: Osteopathic Medicine

## 2023-07-16 DIAGNOSIS — N179 Acute kidney failure, unspecified: Secondary | ICD-10-CM | POA: Diagnosis present

## 2023-07-16 DIAGNOSIS — K529 Noninfective gastroenteritis and colitis, unspecified: Secondary | ICD-10-CM | POA: Diagnosis not present

## 2023-07-16 DIAGNOSIS — E785 Hyperlipidemia, unspecified: Secondary | ICD-10-CM | POA: Diagnosis present

## 2023-07-16 DIAGNOSIS — G43909 Migraine, unspecified, not intractable, without status migrainosus: Secondary | ICD-10-CM | POA: Diagnosis present

## 2023-07-16 DIAGNOSIS — A09 Infectious gastroenteritis and colitis, unspecified: Secondary | ICD-10-CM

## 2023-07-16 DIAGNOSIS — E876 Hypokalemia: Secondary | ICD-10-CM | POA: Diagnosis present

## 2023-07-16 DIAGNOSIS — Z9151 Personal history of suicidal behavior: Secondary | ICD-10-CM

## 2023-07-16 DIAGNOSIS — F411 Generalized anxiety disorder: Secondary | ICD-10-CM | POA: Diagnosis present

## 2023-07-16 DIAGNOSIS — R569 Unspecified convulsions: Secondary | ICD-10-CM

## 2023-07-16 DIAGNOSIS — A0472 Enterocolitis due to Clostridium difficile, not specified as recurrent: Secondary | ICD-10-CM

## 2023-07-16 DIAGNOSIS — Z888 Allergy status to other drugs, medicaments and biological substances status: Secondary | ICD-10-CM

## 2023-07-16 DIAGNOSIS — Z9889 Other specified postprocedural states: Secondary | ICD-10-CM

## 2023-07-16 DIAGNOSIS — R824 Acetonuria: Secondary | ICD-10-CM | POA: Diagnosis present

## 2023-07-16 DIAGNOSIS — E860 Dehydration: Secondary | ICD-10-CM | POA: Diagnosis present

## 2023-07-16 DIAGNOSIS — Z98891 History of uterine scar from previous surgery: Secondary | ICD-10-CM

## 2023-07-16 DIAGNOSIS — Z7951 Long term (current) use of inhaled steroids: Secondary | ICD-10-CM

## 2023-07-16 DIAGNOSIS — Z833 Family history of diabetes mellitus: Secondary | ICD-10-CM

## 2023-07-16 DIAGNOSIS — Z823 Family history of stroke: Secondary | ICD-10-CM

## 2023-07-16 DIAGNOSIS — E872 Acidosis, unspecified: Secondary | ICD-10-CM | POA: Diagnosis present

## 2023-07-16 DIAGNOSIS — Z83511 Family history of glaucoma: Secondary | ICD-10-CM

## 2023-07-16 DIAGNOSIS — F445 Conversion disorder with seizures or convulsions: Secondary | ICD-10-CM | POA: Diagnosis present

## 2023-07-16 DIAGNOSIS — R197 Diarrhea, unspecified: Principal | ICD-10-CM

## 2023-07-16 DIAGNOSIS — J449 Chronic obstructive pulmonary disease, unspecified: Secondary | ICD-10-CM | POA: Diagnosis present

## 2023-07-16 DIAGNOSIS — Z90710 Acquired absence of both cervix and uterus: Secondary | ICD-10-CM

## 2023-07-16 DIAGNOSIS — Z720 Tobacco use: Secondary | ICD-10-CM | POA: Diagnosis present

## 2023-07-16 DIAGNOSIS — F331 Major depressive disorder, recurrent, moderate: Secondary | ICD-10-CM | POA: Diagnosis present

## 2023-07-16 DIAGNOSIS — J342 Deviated nasal septum: Secondary | ICD-10-CM | POA: Diagnosis present

## 2023-07-16 DIAGNOSIS — Z8249 Family history of ischemic heart disease and other diseases of the circulatory system: Secondary | ICD-10-CM

## 2023-07-16 DIAGNOSIS — Z79899 Other long term (current) drug therapy: Secondary | ICD-10-CM

## 2023-07-16 DIAGNOSIS — J439 Emphysema, unspecified: Secondary | ICD-10-CM | POA: Diagnosis present

## 2023-07-16 DIAGNOSIS — F17210 Nicotine dependence, cigarettes, uncomplicated: Secondary | ICD-10-CM | POA: Diagnosis present

## 2023-07-16 LAB — URINALYSIS, ROUTINE W REFLEX MICROSCOPIC
Bilirubin Urine: NEGATIVE
Glucose, UA: NEGATIVE mg/dL
Hgb urine dipstick: NEGATIVE
Ketones, ur: 5 mg/dL — AB
Nitrite: NEGATIVE
Protein, ur: NEGATIVE mg/dL
RBC / HPF: 0 RBC/hpf (ref 0–5)
Specific Gravity, Urine: 1.031 — ABNORMAL HIGH (ref 1.005–1.030)
pH: 5 (ref 5.0–8.0)

## 2023-07-16 LAB — CBC
HCT: 46.6 % — ABNORMAL HIGH (ref 36.0–46.0)
Hemoglobin: 15.7 g/dL — ABNORMAL HIGH (ref 12.0–15.0)
MCH: 31.4 pg (ref 26.0–34.0)
MCHC: 33.7 g/dL (ref 30.0–36.0)
MCV: 93.2 fL (ref 80.0–100.0)
Platelets: 266 10*3/uL (ref 150–400)
RBC: 5 MIL/uL (ref 3.87–5.11)
RDW: 12.5 % (ref 11.5–15.5)
WBC: 13.9 10*3/uL — ABNORMAL HIGH (ref 4.0–10.5)
nRBC: 0 % (ref 0.0–0.2)

## 2023-07-16 LAB — C DIFFICILE QUICK SCREEN W PCR REFLEX
C Diff antigen: NEGATIVE
C Diff interpretation: NOT DETECTED
C Diff toxin: NEGATIVE

## 2023-07-16 LAB — GASTROINTESTINAL PANEL BY PCR, STOOL (REPLACES STOOL CULTURE)

## 2023-07-16 LAB — COMPREHENSIVE METABOLIC PANEL WITH GFR
ALT: 24 U/L (ref 0–44)
AST: 22 U/L (ref 15–41)
Albumin: 4.3 g/dL (ref 3.5–5.0)
Alkaline Phosphatase: 71 U/L (ref 38–126)
Anion gap: 11 (ref 5–15)
BUN: 16 mg/dL (ref 6–20)
CO2: 18 mmol/L — ABNORMAL LOW (ref 22–32)
Calcium: 9.7 mg/dL (ref 8.9–10.3)
Chloride: 109 mmol/L (ref 98–111)
Creatinine, Ser: 0.89 mg/dL (ref 0.44–1.00)
GFR, Estimated: >60 mL/min (ref >60)
Glucose, Bld: 119 mg/dL — ABNORMAL HIGH (ref 70–99)
Potassium: 3.3 mmol/L — ABNORMAL LOW (ref 3.5–5.1)
Sodium: 138 mmol/L (ref 135–145)
Total Bilirubin: 0.6 mg/dL (ref 0.0–1.2)
Total Protein: 7.8 g/dL (ref 6.5–8.1)

## 2023-07-16 LAB — LIPASE, BLOOD: Lipase: 22 U/L (ref 11–51)

## 2023-07-16 MED ORDER — HYDRALAZINE HCL 20 MG/ML IJ SOLN: 5.0000 mg | INTRAMUSCULAR | Status: DC | PRN

## 2023-07-16 MED ORDER — UMECLIDINIUM BROMIDE 62.5 MCG/ACT IN AEPB
1.0000 | INHALATION_SPRAY | Freq: Every day | RESPIRATORY_TRACT | Status: DC
  Administered 2023-07-17 – 2023-07-20 (×4): 1 via RESPIRATORY_TRACT
  Filled 2023-07-16: qty 7

## 2023-07-16 MED ORDER — TOPIRAMATE 100 MG PO TABS
100.0000 mg | ORAL_TABLET | Freq: Two times a day (BID) | ORAL | Status: DC
  Administered 2023-07-16 – 2023-07-20 (×8): 100 mg via ORAL
  Filled 2023-07-16 (×8): qty 1

## 2023-07-16 MED ORDER — ONDANSETRON HCL 4 MG/2ML IJ SOLN: 4.0000 mg | Freq: Four times a day (QID) | INTRAMUSCULAR | Status: DC | PRN

## 2023-07-16 MED ORDER — SERTRALINE HCL 50 MG PO TABS
50.0000 mg | ORAL_TABLET | Freq: Every day | ORAL | Status: DC
  Administered 2023-07-17 – 2023-07-20 (×4): 50 mg via ORAL
  Filled 2023-07-16 (×4): qty 1

## 2023-07-16 MED ORDER — PIPERACILLIN-TAZOBACTAM 3.375 G IVPB 30 MIN
3.3750 g | Freq: Once | INTRAVENOUS | Status: AC
  Administered 2023-07-16: 3.375 g via INTRAVENOUS
  Filled 2023-07-16: qty 50

## 2023-07-16 MED ORDER — POTASSIUM CHLORIDE 10 MEQ/100ML IV SOLN
10.0000 meq | INTRAVENOUS | Status: AC
  Administered 2023-07-16 – 2023-07-17 (×3): 10 meq via INTRAVENOUS
  Filled 2023-07-16 (×2): qty 100

## 2023-07-16 MED ORDER — LACTATED RINGERS IV SOLN: INTRAVENOUS | Status: DC

## 2023-07-16 MED ORDER — ACETAMINOPHEN 325 MG PO TABS
650.0000 mg | ORAL_TABLET | Freq: Four times a day (QID) | ORAL | Status: DC | PRN
  Administered 2023-07-17 – 2023-07-19 (×2): 650 mg via ORAL
  Filled 2023-07-16 (×2): qty 2

## 2023-07-16 MED ORDER — FLUTICASONE FUROATE-VILANTEROL 100-25 MCG/ACT IN AEPB
1.0000 | INHALATION_SPRAY | Freq: Every day | RESPIRATORY_TRACT | Status: DC
  Administered 2023-07-17 – 2023-07-20 (×4): 1 via RESPIRATORY_TRACT
  Filled 2023-07-16: qty 28

## 2023-07-16 MED ORDER — POTASSIUM CHLORIDE 10 MEQ/100ML IV SOLN
10.0000 meq | INTRAVENOUS | Status: AC
  Administered 2023-07-16: 10 meq via INTRAVENOUS
  Filled 2023-07-16 (×2): qty 100

## 2023-07-16 MED ORDER — MORPHINE SULFATE (PF) 4 MG/ML IV SOLN
4.0000 mg | Freq: Once | INTRAVENOUS | Status: AC
  Administered 2023-07-16: 4 mg via INTRAVENOUS
  Filled 2023-07-16: qty 1

## 2023-07-16 MED ORDER — OXYCODONE HCL 5 MG PO TABS
5.0000 mg | ORAL_TABLET | ORAL | Status: DC | PRN
  Administered 2023-07-16 – 2023-07-20 (×12): 5 mg via ORAL
  Filled 2023-07-16 (×12): qty 1

## 2023-07-16 MED ORDER — ALBUTEROL SULFATE (2.5 MG/3ML) 0.083% IN NEBU: 2.5000 mg | INHALATION_SOLUTION | Freq: Four times a day (QID) | RESPIRATORY_TRACT | Status: DC | PRN

## 2023-07-16 MED ORDER — IOHEXOL 300 MG/ML  SOLN
100.0000 mL | Freq: Once | INTRAMUSCULAR | Status: AC | PRN
  Administered 2023-07-16: 100 mL via INTRAVENOUS

## 2023-07-16 MED ORDER — PIPERACILLIN-TAZOBACTAM 3.375 G IVPB
3.3750 g | Freq: Three times a day (TID) | INTRAVENOUS | Status: DC
  Administered 2023-07-17 – 2023-07-20 (×11): 3.375 g via INTRAVENOUS
  Filled 2023-07-16 (×11): qty 50

## 2023-07-16 MED ORDER — ARIPIPRAZOLE 2 MG PO TABS: 2.0000 mg | ORAL_TABLET | Freq: Every day | ORAL | Status: DC

## 2023-07-16 MED ORDER — ENOXAPARIN SODIUM 40 MG/0.4ML IJ SOSY
40.0000 mg | PREFILLED_SYRINGE | INTRAMUSCULAR | Status: DC
  Administered 2023-07-16 – 2023-07-19 (×4): 40 mg via SUBCUTANEOUS
  Filled 2023-07-16 (×4): qty 0.4

## 2023-07-16 MED ORDER — ONDANSETRON HCL 4 MG/2ML IJ SOLN
4.0000 mg | Freq: Once | INTRAMUSCULAR | Status: AC
  Administered 2023-07-16: 4 mg via INTRAVENOUS
  Filled 2023-07-16: qty 2

## 2023-07-16 MED ORDER — ACETAMINOPHEN 650 MG RE SUPP: 650.0000 mg | Freq: Four times a day (QID) | RECTAL | Status: DC | PRN

## 2023-07-16 MED ORDER — NORTRIPTYLINE HCL 25 MG PO CAPS
50.0000 mg | ORAL_CAPSULE | Freq: Every day | ORAL | Status: DC
  Administered 2023-07-16 – 2023-07-19 (×4): 50 mg via ORAL
  Filled 2023-07-16 (×5): qty 2

## 2023-07-16 MED ORDER — NICOTINE 14 MG/24HR TD PT24
14.0000 mg | MEDICATED_PATCH | Freq: Every day | TRANSDERMAL | Status: DC
  Filled 2023-07-16 (×4): qty 1

## 2023-07-16 MED ORDER — LACTATED RINGERS IV BOLUS
1000.0000 mL | Freq: Once | INTRAVENOUS | Status: AC
  Administered 2023-07-16: 1000 mL via INTRAVENOUS

## 2023-07-16 MED ORDER — ONDANSETRON HCL 4 MG PO TABS: 4.0000 mg | ORAL_TABLET | Freq: Four times a day (QID) | ORAL | Status: DC | PRN

## 2023-07-16 MED ORDER — CLONAZEPAM 0.5 MG PO TABS
0.5000 mg | ORAL_TABLET | Freq: Two times a day (BID) | ORAL | Status: DC | PRN
  Administered 2023-07-16 – 2023-07-18 (×3): 0.5 mg via ORAL
  Filled 2023-07-16 (×4): qty 1

## 2023-07-16 NOTE — ED Triage Notes (Signed)
 Brought from [facility]. Having diarrhea since Saturday. Little PO intake. C/o abdominal pain, fatigue, headache, and blurry vision.   KC vitals: 102/74 b/p 79Pulse 97O2 97.7oral

## 2023-07-16 NOTE — ED Provider Notes (Signed)
 [facility] Provider Note    Event Date/Time   First MD Initiated Contact with Patient 07/16/23 1031     (approximate)   History   Abdominal Pain   HPI  Nancy Porter is a 53 y.o. female who presents to the ED for evaluation of Abdominal Pain   Review of PCP visit from 1 week ago, and this morning.  Seen for URI 1 week ago and prescribed Augmentin and Phenergan.  Otherwise history of migraines, emphysema, tobacco abuse, PNES.  Patient presents alongside her husband for evaluation of diarrhea and lower abdominal pain.  Reports starting the Augmentin this past Wednesday.  Developed diarrhea and lower abdominal cramping on Sunday, over the past 3 days.  Repeated diarrhea.  No emesis.  No fevers or syncope.  Reports no urine for the past few days.  She is s/p cesarean section x 2   Physical Exam   Triage Vital Signs: ED Triage Vitals  Encounter Vitals Group     BP 07/16/23 0945 98/87     Systolic BP Percentile --      Diastolic BP Percentile --      Pulse Rate 07/16/23 0945 75     Resp 07/16/23 0945 18     Temp 07/16/23 0945 97.6 F (36.4 C)     Temp Source 07/16/23 0945 Oral     SpO2 07/16/23 0945 99 %     Weight 07/16/23 0942 158 lb (71.7 kg)     Height 07/16/23 0942 5\' 7"  (1.702 m)     Head Circumference --      Peak Flow --      Pain Score 07/16/23 0945 7     Pain Loc --      Pain Education --      Exclude from Growth Chart --     Most recent vital signs: Vitals:   07/16/23 0945 07/16/23 1425  BP: 98/87 119/77  Pulse: 75 70  Resp: 18 18  Temp: 97.6 F (36.4 C) 98.2 F (36.8 C)  SpO2: 99% 99%    General: Awake, no distress.  CV:  Good peripheral perfusion.  Resp:  Normal effort.  Abd:  No distention.  Mild lower abdominal tenderness, no peritoneal features. MSK:  No deformity noted.  Neuro:  No focal deficits appreciated. Other:     ED Results / Procedures / Treatments   Labs (all labs ordered are listed, but only  abnormal results are displayed) Labs Reviewed  COMPREHENSIVE METABOLIC PANEL WITH GFR - Abnormal; Notable for the following components:      Result Value   Potassium 3.3 (*)    CO2 18 (*)    Glucose, Bld 119 (*)    All other components within normal limits  CBC - Abnormal; Notable for the following components:   WBC 13.9 (*)    Hemoglobin 15.7 (*)    HCT 46.6 (*)    All other components within normal limits  URINALYSIS, ROUTINE W REFLEX MICROSCOPIC - Abnormal; Notable for the following components:   Color, Urine YELLOW (*)    APPearance TURBID (*)    Specific Gravity, Urine 1.031 (*)    Ketones, ur 5 (*)    Leukocytes,Ua TRACE (*)    Bacteria, UA MANY (*)    All other components within normal limits  C DIFFICILE QUICK SCREEN W PCR REFLEX    GASTROINTESTINAL PANEL BY PCR, STOOL (REPLACES STOOL CULTURE)  LIPASE, BLOOD    EKG   RADIOLOGY  Official radiology report(s): No results found.  PROCEDURES and INTERVENTIONS:  Procedures  Medications  ondansetron (ZOFRAN) injection 4 mg (4 mg Intravenous Given 07/16/23 1107)  lactated ringers bolus 1,000 mL (1,000 mLs Intravenous New Bag/Given 07/16/23 1107)  iohexol (OMNIPAQUE) 300 MG/ML solution 100 mL (100 mLs Intravenous Contrast Given 07/16/23 1327)     IMPRESSION / MDM / ASSESSMENT AND PLAN / ED COURSE  I reviewed the triage vital signs and the nursing notes.  Differential diagnosis includes, but is not limited to, medication side effect, C. difficile, SBO, gastroenteritis, colitis, irritable bowel or inflammatory bowel  {Patient presents with symptoms of an acute illness or injury that is potentially life-threatening.  Patient presents with a few days of copious watery diarrhea after recent Augmentin course.  C. difficile testing is negative as well as her GI panel.  Urine to small ketones suggestive of dehydration but no infectious features.  Minimal leukocytosis of 14, negative lipase, small non-anion gap metabolic  acidosis.  She received IV fluids, antiemetics and CT scan is pending at the time of signout.      FINAL CLINICAL IMPRESSION(S) / ED DIAGNOSES   Final diagnoses:  Diarrhea, unspecified type     Rx / DC Orders   ED Discharge Orders     None        Note:  This document was prepared using Dragon voice recognition software and may include unintentional dictation errors.   Delton Prairie, MD 07/16/23 [phone]

## 2023-07-16 NOTE — H&P (Signed)
 History and Physical    Patient: Nancy Porter:[id number] DOB: 02/09/1971 DOA: 07/16/2023 DOS: the patient was seen and examined on 07/16/2023 PCP: Mick Sell, MD  Patient coming from: Home - lives with husband, mother; NOK: [place], [place], [phone]   Chief Complaint: Abdominal pain  HPI: Nancy Porter is a 53 y.o. female with medical history significant of COPD, pseudoseizures, anxiety/depression, and HLD who presented on 4/1 with abdominal pain.  She reports that she awoke on Sunday (3/30) am with abdominal pain and had a loose BM.  She attributed it to pizza from the night before.  They went to a garden store and about 90 minutes after the first episode she had explosive diarrhea.  Since then, she has had diarrhea about hourly, day and night.  No sleep.  Abdominal tenderness with severe cramping before episodes.  Minimal PO intake.  No fevers.  No sick contacts.    ER Course:   4 days of abdominal pain, diarrhea.  No vomiting.  Labs with WBC elevation, CT with colitis.  Given IV abx.  She doesn't think she will be able to tolerate PO abx and so wants to stay.     Review of Systems: As mentioned in the history of present illness. All other systems reviewed and are negative. Past Medical History:  Diagnosis Date   Anxiety    Arrhythmia    COPD (chronic obstructive pulmonary disease) (HCC)    Depression    Deviated septum    Hx of migraines    Hx of suicide attempt 2010   overdose on Trileptal, has been on Seroquel in the past   Hyperlipidemia    Tobacco use    Past Surgical History:  Procedure Laterality Date   ABDOMINAL HYSTERECTOMY     AUGMENTATION MAMMAPLASTY Bilateral    implants 30 years ago-saline   BREAST BIOPSY Right 08/19/2019   Affirm bx-"coil" clip-path pending   BREAST ENHANCEMENT SURGERY     CESAREAN SECTION     x 2   LEFT HEART CATH AND CORONARY ANGIOGRAPHY N/A 09/08/2020   Procedure: LEFT HEART CATH AND CORONARY ANGIOGRAPHY;   Surgeon: Lamar Blinks, MD;  Location: [facility] INVASIVE CV LAB;  Service: Cardiovascular;  Laterality: N/A;   SHOULDER SURGERY Left    impingement   TRANSUMBILICAL AUGMENTATION MAMMAPLASTY     Social History:  reports that she has been smoking cigarettes. She started smoking about 39 years ago. She has a 17.5 pack-year smoking history. She has never used smokeless tobacco. She reports that she does not currently use alcohol. She reports current drug use. Frequency: 7.00 times per week. Drug: Marijuana.  Allergies  Allergen Reactions   Other     Hormone patches---caused inability to speak/walk    Family History  Problem Relation Age of Onset   Glaucoma Mother    Heart disease Mother    Heart Problems Mother    Arrhythmia Mother        fib   Deep vein thrombosis Father    Heart Problems Father    Heart disease Father    Migraines Sister    Diabetes Paternal Uncle        adult onset   CAD Maternal Grandfather    Stroke Maternal Grandmother    Aneurysm Maternal Grandmother    Heart disease Maternal Grandmother    Drug abuse Son    Seizures Neg Hx     Prior to Admission medications   Medication Sig Start Date End  Date Taking? Authorizing Provider  albuterol (VENTOLIN HFA) 108 (90 Base) MCG/ACT inhaler TAKE 2 PUFFS BY MOUTH EVERY 6 HOURS AS NEEDED FOR WHEEZE OR SHORTNESS OF BREATH Patient taking differently: Inhale 2 puffs into the lungs See admin instructions. Inhale 2 puffs (scheduled)  twice daily & use  every 6 hours as needed for wheezing/shortness of breath. 03/11/20   Steffanie Dunn, DO  ARIPiprazole (ABILIFY) 2 MG tablet Take 1 tablet (2 mg total) by mouth daily. 02/04/23   Jomarie Longs, MD  botulinum toxin Type A (BOTOX) 200 units injection Provider to inject 155 units into the muscles of the head and neck every 12 weeks. Discard remainder. 06/05/23   Anson Fret, MD  clonazePAM (KLONOPIN) 0.5 MG tablet Take 0.5 mg by mouth in the morning and at bedtime.     [provider]  COLLAGEN PO Take 1 capsule by mouth daily. Patient not taking: Reported on 06/25/2023    [provider]  Erenumab-aooe (AIMOVIG) 140 MG/ML SOAJ Inject 140 mg into the skin every 30 (thirty) days. 06/25/23   Glean Salvo, NP  nortriptyline (PAMELOR) 50 MG capsule Take 1 capsule (50 mg total) by mouth at bedtime. 08/29/22   Glean Salvo, NP  sertraline (ZOLOFT) 50 MG tablet Take 50 mg by mouth daily. 1 tablet daily for 2 weeks then increase to 75mg  daily    [provider]  SPIRULINA PO Take 1 tablet by mouth daily. Patient not taking: Reported on 06/25/2023    [provider]  topiramate (TOPAMAX) 100 MG tablet Take 1 tablet (100 mg total) by mouth 2 (two) times daily. 08/29/22   Glean Salvo, NP  Ubrogepant (UBRELVY) 100 MG TABS Take 1 tablet (100 mg total) by mouth as needed (take 1 tablet for acute headache, may repeat in 2 hours if needed, max is 200 mg in 24 hours). 08/29/22   Glean Salvo, NP    Physical Exam: Vitals:   07/16/23 4696 07/16/23 0945 07/16/23 1425  BP:  98/87 119/77  Pulse:  75 70  Resp:  18 18  Temp:  97.6 F (36.4 C) 98.2 F (36.8 C)  TempSrc:  Oral Oral  SpO2:  99% 99%  Weight: 71.7 kg    Height: 5\' 7"  (1.702 m)     General:  Appears calm and comfortable and is in NAD Eyes:   EOMI, normal lids, iris ENT:  grossly normal hearing, lips & tongue, mmm Neck:  no LAD, masses or thyromegaly Cardiovascular:  RRR, no m/r/g. No LE edema.  Respiratory:   CTA bilaterally with no wheezes/rales/rhonchi.  Normal respiratory effort. Abdomen:  soft, mildly diffusely TTP, ND, NABS Skin:  no rash or induration seen on limited exam Musculoskeletal:  grossly normal tone BUE/BLE, good ROM, no bony abnormality Psychiatric:  blunted mood and affect, speech fluent and appropriate, AOx3 Neurologic:  CN 2-12 grossly intact, moves all extremities in coordinated fashion   Radiological Exams on Admission: Independently reviewed -  see discussion in A/P where applicable  CT ABDOMEN PELVIS W CONTRAST Result Date: 07/16/2023 CLINICAL DATA:  Evaluate for small bowel obstruction. Lower abdominal pain with diarrhea. EXAM: CT ABDOMEN AND PELVIS WITH CONTRAST TECHNIQUE: Multidetector CT imaging of the abdomen and pelvis was performed using the standard protocol following bolus administration of intravenous contrast. RADIATION DOSE REDUCTION: This exam was performed according to the departmental dose-optimization program which includes automated exposure control, adjustment of the mA and/or kV according to patient size and/or use of  iterative reconstruction technique. CONTRAST:  OMNIPAQUE IOHEXOL 300 MG/ML  SOLN COMPARISON:  CT abdomen and pelvis 12/17/2018 FINDINGS: Lower chest: No acute abnormality. Hepatobiliary: No focal liver abnormality is seen. No gallstones, gallbladder wall thickening, or biliary dilatation. Pancreas: Unremarkable. No pancreatic ductal dilatation or surrounding inflammatory changes. Spleen: Normal in size without focal abnormality. Adrenals/Urinary Tract: The bladder is decompressed and not well evaluated. The kidneys and adrenal glands are within normal limits. Stomach/Bowel: There is mild wall thickening of the colon diffusely most significant in the descending colon and transverse colon. There is no surrounding inflammation. The appendix, small bowel and stomach are within normal limits. No acute inflammatory stranding present. Vascular/Lymphatic: No significant vascular findings are present. No enlarged abdominal or pelvic lymph nodes. Reproductive: Status post hysterectomy. No adnexal masses. Other: No abdominal wall hernia or abnormality. No abdominopelvic ascites. Musculoskeletal: No acute osseous findings. Bilateral breast implants are present. IMPRESSION: 1. Mild wall thickening of the colon diffusely most significant in the descending colon and transverse colon, worrisome for infectious/inflammatory colitis.  2. No evidence for bowel obstruction. Electronically Signed   By: Darliss Cheney M.D.   On: 07/16/2023 15:40    EKG: None   Labs on Admission: I have personally reviewed the available labs and imaging studies at the time of the admission.  Pertinent labs:    K+ 3.3 CO2 18 Glucose 119 WBC 13.9 UA: 5 ketones, trace LE, many bacteria C diff negative GI pathogen panel negative   Assessment and Plan: Principal Problem:   Colitis Active Problems:   Tobacco use   GAD (generalized anxiety disorder)   MDD (major depressive disorder), recurrent episode, moderate (HCC)   Seizure-like activity (HCC)   Dehydration, mild   COPD (chronic obstructive pulmonary disease) (HCC)    Colitis Patient's symptoms are c/w colitis and her CT supports this as a diagnosis Her only SIRS criteria is leukocytosis (13.9) For now, will give CLD, IVF, pain medication with morphine, nausea medication with Zofran, and treat with Zosyn for intraabdominal infection If not improving, she may need CT and/or GI/surgery consultation Will observe overnight on Med Surg  Hypokalemia Replete Recheck in AM  Mild dehydration Ketonuria, mild metabolic acidosis Likely resulting from dehydration associated with GI losses Anticipate resolution with IVF  COPD/Tobacco dependence Reports no tobacco use since Sunday Will order nicotine patch Cessation encouraged Continue Trelegy, albuterol prn  Anxiety/depression Continue Klonopin, nortriptyline, sertraline, Abilify, topamax  HLD She does not appear to be taking medications for this issue at this time     Advance Care Planning:   Code Status: Full Code - Code status was discussed with the patient at the time of admission.  The patient would want to receive full resuscitative measures at this time.   Consults: None  DVT Prophylaxis: Lovenox  Family Communication: None present; she is capable of communicating with her husband at this time  Severity of  Illness: The appropriate patient status for this patient is OBSERVATION. Observation status is judged to be reasonable and necessary in order to provide the required intensity of service to ensure the patient's safety. The patient's presenting symptoms, physical exam findings, and initial radiographic and laboratory data in the context of their medical condition is felt to place them at decreased risk for further clinical deterioration. Furthermore, it is anticipated that the patient will be medically stable for discharge from the hospital within 2 midnights of admission.   Author: Jonah Blue, MD 07/16/2023 5:33 PM  For on call review [email or web address].

## 2023-07-17 DIAGNOSIS — K529 Noninfective gastroenteritis and colitis, unspecified: Secondary | ICD-10-CM | POA: Diagnosis not present

## 2023-07-17 LAB — BASIC METABOLIC PANEL WITH GFR
Anion gap: 8 (ref 5–15)
BUN: 14 mg/dL (ref 6–20)
CO2: 22 mmol/L (ref 22–32)
Calcium: 8.4 mg/dL — ABNORMAL LOW (ref 8.9–10.3)
Chloride: 104 mmol/L (ref 98–111)
Creatinine, Ser: 0.78 mg/dL (ref 0.44–1.00)
GFR, Estimated: >60 mL/min (ref >60)
Glucose, Bld: 84 mg/dL (ref 70–99)
Potassium: 3.5 mmol/L (ref 3.5–5.1)
Sodium: 134 mmol/L — ABNORMAL LOW (ref 135–145)

## 2023-07-17 LAB — CBC
HCT: 39.1 % (ref 36.0–46.0)
Hemoglobin: 13.4 g/dL (ref 12.0–15.0)
MCH: 31.7 pg (ref 26.0–34.0)
MCHC: 34.3 g/dL (ref 30.0–36.0)
MCV: 92.4 fL (ref 80.0–100.0)
Platelets: 214 10*3/uL (ref 150–400)
RBC: 4.23 MIL/uL (ref 3.87–5.11)
RDW: 12.7 % (ref 11.5–15.5)
WBC: 10.6 10*3/uL — ABNORMAL HIGH (ref 4.0–10.5)
nRBC: 0 % (ref 0.0–0.2)

## 2023-07-17 LAB — HIV ANTIBODY (ROUTINE TESTING W REFLEX): HIV Screen 4th Generation wRfx: NONREACTIVE

## 2023-07-17 MED ORDER — DICYCLOMINE HCL 20 MG PO TABS
20.0000 mg | ORAL_TABLET | Freq: Three times a day (TID) | ORAL | Status: DC
  Administered 2023-07-17 – 2023-07-18 (×4): 20 mg via ORAL
  Filled 2023-07-17 (×5): qty 1

## 2023-07-17 MED ORDER — LOPERAMIDE HCL 2 MG PO CAPS: 4.0000 mg | ORAL_CAPSULE | ORAL | Status: DC | PRN

## 2023-07-17 MED ORDER — MORPHINE SULFATE (PF) 2 MG/ML IV SOLN
2.0000 mg | INTRAVENOUS | Status: DC | PRN
  Administered 2023-07-17 – 2023-07-20 (×5): 2 mg via INTRAVENOUS
  Filled 2023-07-17 (×7): qty 1

## 2023-07-17 NOTE — Hospital Course (Addendum)
 Hospital course / significant events:   HPI: Nancy Porter is a 53 y.o. female with medical history significant of COPD, pseudoseizures, anxiety/depression, and HLD who presented on 4/1 with abdominal pain.  She reports that she awoke on Sunday (3/30) am with abdominal pain and had a loose BM.  She attributed it to pizza from the night before.  They went to a garden store and about 90 minutes after the first episode she had explosive diarrhea.  Since then, she has had diarrhea about hourly, day and night.  No sleep.  Abdominal pain with severe cramping before episodes.  Minimal PO intake.  No fevers.  No sick contacts.   04/01: Labs with WBC elevation, CT with colitis. Given IV abx. She doesn't think she will be able to tolerate PO abx and so wants to stay. Admitted to hospitalist service under observation status  04/02: remains significant abd pain and po intolerance, diarrhea. Continue IV fluids     Consultants:  none  Procedures/Surgeries: none      ASSESSMENT & PLAN:   Colitis NO sepsis - only SIRS criteria is leukocytosis (13.9) GI PCR and CDiff neg  CLD advance as able/tolerated  IVF pain tx with morphine, oxycodone nausea tx with Zofran Abx tx with Zosyn  Imodium Consider repeat imaging if worse/not resolving  Hypokalemia Replace as needed Monitor BMP   Mild dehydration Ketonuria, mild metabolic acidosis Likely resulting from dehydration associated with GI losses Anticipate resolution with IVF Monitor    COPD/Tobacco dependence Reports no tobacco use since Sunday Will order nicotine patch Cessation encouraged Continue Trelegy, albuterol prn   Anxiety/depression Continue Klonopin, nortriptyline, sertraline, Abilify, topamax   HLD Not currently on medication Follow outpatient     No concerns based on BMI: Body mass index is 24.75 kg/m.  Underweight - under 18  overweight - 25 to 29 obese - 30 or more Class 1 obesity: BMI of 30.0 to 34 Class 2  obesity: BMI of 35.0 to 39 Class 3 obesity: BMI of 40.0 to 49 Super Morbid Obesity: BMI 50-59 Super-super Morbid Obesity: BMI 60+ Significantly low or high BMI is associated with higher medical risk.  Weight management advised as adjunct to other disease management and risk reduction treatments    DVT prophylaxis: lovenox  IV fluids: LR continuous IV fluids  Nutrition: CLD advance as tolerated / as able  Central lines / other devices: none  Code Status: FULL CODE ACP documentation reviewed:  none on file in [facility]  TOC needs: TBD Medical barriers to dispo: needing IV fluids, unable to tolerate po. Expected medical readiness for discharge 1-2 days .

## 2023-07-17 NOTE — TOC Initial Note (Signed)
 Transition of Care [facility]) - Initial/Assessment Note    Patient Details  Name: Nancy Porter MRN: [id number] Date of Birth: 1970/08/07  Transition of Care [facility]) CM/SW Contact:    Marlowe Sax, RN Phone Number: 07/17/2023, 9:53 AM  Clinical Narrative:                  Transition of Care [facility]) - Inpatient Brief Assessment   Patient Details  Name: Nancy Porter MRN: [id number] Date of Birth: 1970/12/20  [facility]) CM/SW Contact:    Marlowe Sax, RN Phone Number: 07/17/2023, 9:53 AM   Clinical Narrative:    Transition of Care Asessment: Insurance and Status: Insurance coverage has been reviewed Patient has primary care physician: Yes Sampson Goon, Stann Mainland, MD    Provider Role General - Infectious Diseases  Office Phone [phone]  Personal Phone [phone]) Home environment has been reviewed: home with spouse Prior level of function:: independent Prior/Current Home Services: No current home services Social Drivers of Health Review: SDOH reviewed no interventions necessary Readmission risk has been reviewed: Yes Transition of care needs: no transition of care needs at this time        Patient Goals and CMS Choice            Expected Discharge Plan and Services                                              Prior Living Arrangements/Services                       Activities of Daily Living   ADL Screening (condition at time of admission) Independently performs ADLs?: Yes (appropriate for developmental age) Is the patient deaf or have difficulty hearing?: No Does the patient have difficulty seeing, even when wearing glasses/contacts?: No Does the patient have difficulty concentrating, remembering, or making decisions?: No  Permission Sought/Granted                  Emotional Assessment              Admission diagnosis:  Colitis [K52.9] Diarrhea, unspecified type [R19.7] Patient Active Problem List    Diagnosis Date Noted   Colitis 07/16/2023   Dehydration, mild 07/16/2023   COPD (chronic obstructive pulmonary disease) (HCC) 07/16/2023   Racing heart beat 04/29/2023   Hypersomnia with long sleep time, idiopathic 04/29/2023   Seizure-like activity (HCC) 04/03/2023   At risk for prolonged QT interval syndrome 12/06/2022   Long term current use of cannabis 10/24/2022   GAD (generalized anxiety disorder) 10/24/2022   MDD (major depressive disorder), recurrent episode, moderate (HCC) 10/24/2022   Cocaine use disorder, moderate, in sustained remission (HCC) 10/24/2022   History of alcoholism (HCC) 10/24/2022   High risk medication use 10/24/2022   Angina pectoris (HCC) 08/24/2020   Moderate persistent asthma without complication 07/19/2020   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 07/18/2020   Chronic migraine without aura without status migrainosus, not intractable 07/18/2020   Nonepileptic episode (HCC) 09/28/2019   Altered mental status 09/14/2019   Tobacco use    Depression    Chronic migraine without aura, with intractable migraine, so stated, with status migrainosus 12/30/2017   PCP:  Mick Sell, MD Pharmacy:   [id number] [place], [place] - [address] [place]  [place] [address] Phone: [phone] Fax: [phone]     Social Drivers of Health (SDOH) Social History: SDOH Screenings   Food Insecurity: No Food Insecurity (07/16/2023)  Housing: Low Risk  (07/16/2023)  Transportation Needs: No Transportation Needs (07/16/2023)  Utilities: Not At Risk (07/16/2023)  Depression (PHQ2-9): High Risk (12/06/2022)  Financial Resource Strain: Medium Risk (06/27/2022)   Received [facility], [facility]  Tobacco Use: High Risk (07/16/2023)   SDOH Interventions:     Readmission Risk Interventions     No data to display

## 2023-07-17 NOTE — Progress Notes (Signed)
 PROGRESS NOTE    Nancy Porter   MWU:[id number] DOB: 16-Jul-1970  DOA: 07/16/2023 Date of Service: 07/17/23 which is hospital day 0  PCP: Mick Sell, MD    Hospital course / significant events:   HPI: Nancy Porter is a 53 y.o. female with medical history significant of COPD, pseudoseizures, anxiety/depression, and HLD who presented on 4/1 with abdominal pain.  She reports that she awoke on Sunday (3/30) am with abdominal pain and had a loose BM.  She attributed it to pizza from the night before.  They went to a garden store and about 90 minutes after the first episode she had explosive diarrhea.  Since then, she has had diarrhea about hourly, day and night.  No sleep.  Abdominal pain with severe cramping before episodes.  Minimal PO intake.  No fevers.  No sick contacts.   04/01: Labs with WBC elevation, CT with colitis. Given IV abx. She doesn't think she will be able to tolerate PO abx and so wants to stay. Admitted to hospitalist service under observation status  04/02: remains significant abd pain and po intolerance, diarrhea. Continue IV fluids     Consultants:  none  Procedures/Surgeries: none      ASSESSMENT & PLAN:   Colitis NO sepsis - only SIRS criteria is leukocytosis (13.9) GI PCR and CDiff neg  CLD advance as able/tolerated  IVF pain tx with morphine, oxycodone nausea tx with Zofran Abx tx with Zosyn  Imodium Consider repeat imaging if worse/not resolving  Hypokalemia Replace as needed Monitor BMP   Mild dehydration Ketonuria, mild metabolic acidosis Likely resulting from dehydration associated with GI losses Anticipate resolution with IVF Monitor    COPD/Tobacco dependence Reports no tobacco use since Sunday Will order nicotine patch Cessation encouraged Continue Trelegy, albuterol prn   Anxiety/depression Continue Klonopin, nortriptyline, sertraline, Abilify, topamax   HLD Not currently on medication Follow outpatient      No concerns based on BMI: Body mass index is 24.75 kg/m.  Underweight - under 18  overweight - 25 to 29 obese - 30 or more Class 1 obesity: BMI of 30.0 to 34 Class 2 obesity: BMI of 35.0 to 39 Class 3 obesity: BMI of 40.0 to 49 Super Morbid Obesity: BMI 50-59 Super-super Morbid Obesity: BMI 60+ Significantly low or high BMI is associated with higher medical risk.  Weight management advised as adjunct to other disease management and risk reduction treatments    DVT prophylaxis: lovenox  IV fluids: LR continuous IV fluids  Nutrition: CLD advance as tolerated / as able  Central lines / other devices: none  Code Status: FULL CODE ACP documentation reviewed:  none on file in [facility]  TOC needs: TBD Medical barriers to dispo: needing IV fluids, unable to tolerate po. Expected medical readiness for discharge 1-2 days .              Subjective / Brief ROS:  Patient reports abd pain is persistent Frequent loose stool overnight  Denies CP/SOB.  Pain controlled.  Denies new weakness.  Tolerating very minimal po intake, seems to trigger abd pain.  Reports no concerns w/ urination/defecation.   Family Communication: family at bedside on rounds     Objective Findings:  Vitals:   07/16/23 1425 07/16/23 2050 07/17/23 0336 07/17/23 0804  BP: 119/77 94/61 (!) 88/58 (!) 95/58  Pulse: 70 (!) 57 (!) 59 62  Resp: 18 18 16 16   Temp: 98.2 F (36.8 C) 97.7 F (36.5 C) 97.9 F (  36.6 C) 98.3 F (36.8 C)  TempSrc: Oral   Oral  SpO2: 99% 97% 98% 95%  Weight:      Height:        Intake/Output Summary (Last 24 hours) at 07/17/2023 1522 Last data filed at 07/17/2023 1300 Gross per 24 hour  Intake 1269.91 ml  Output --  Net 1269.91 ml   Filed Weights   07/16/23 0942  Weight: 71.7 kg    Examination:  Physical Exam Constitutional:      General: She is not in acute distress.    Appearance: She is not ill-appearing.  Cardiovascular:     Rate and Rhythm: Normal rate  and regular rhythm.  Pulmonary:     Effort: Pulmonary effort is normal.     Breath sounds: Normal breath sounds.  Abdominal:     General: Bowel sounds are increased. There is no distension.     Palpations: Abdomen is soft.     Tenderness: There is generalized abdominal tenderness. There is no guarding or rebound.  Skin:    General: Skin is warm and dry.  Neurological:     General: No focal deficit present.     Mental Status: She is alert and oriented to person, place, and time.  Psychiatric:        Mood and Affect: Mood normal.        Behavior: Behavior normal.          Scheduled Medications:   dicyclomine  20 mg Oral TID   enoxaparin (LOVENOX) injection  40 mg Subcutaneous Q24H   fluticasone furoate-vilanterol  1 puff Inhalation Daily   And   umeclidinium bromide  1 puff Inhalation Daily   nicotine  14 mg Transdermal Daily   nortriptyline  50 mg Oral QHS   sertraline  50 mg Oral Daily   topiramate  100 mg Oral BID    Continuous Infusions:  lactated ringers 75 mL/hr at 07/17/23 1315   piperacillin-tazobactam (ZOSYN)  IV 3.375 g (07/17/23 0842)    PRN Medications:  acetaminophen **OR** acetaminophen, albuterol, clonazePAM, hydrALAZINE, loperamide, morphine injection, ondansetron **OR** ondansetron (ZOFRAN) IV, oxyCODONE  Antimicrobials from admission:  Anti-infectives (From admission, onward)    Start     Dose/Rate Route Frequency Ordered Stop   07/17/23 0000  piperacillin-tazobactam (ZOSYN) IVPB 3.375 g        3.375 g 12.5 mL/hr over 240 Minutes Intravenous Every 8 hours 07/16/23 2147     07/16/23 1615  piperacillin-tazobactam (ZOSYN) IVPB 3.375 g        3.375 g 100 mL/hr over 30 Minutes Intravenous  Once 07/16/23 1613 07/16/23 1750           Data Reviewed:  I have personally reviewed the following...  CBC: Recent Labs  Lab 07/16/23 0949 07/17/23 0152  WBC 13.9* 10.6*  HGB 15.7* 13.4  HCT 46.6* 39.1  MCV 93.2 92.4  PLT 266 214   Basic  Metabolic Panel: Recent Labs  Lab 07/16/23 0949 07/17/23 0152  NA 138 134*  K 3.3* 3.5  CL 109 104  CO2 18* 22  GLUCOSE 119* 84  BUN 16 14  CREATININE 0.89 0.78  CALCIUM 9.7 8.4*   GFR: Estimated Creatinine Clearance: 77.3 mL/min (by C-G formula based on SCr of 0.78 mg/dL). Liver Function Tests: Recent Labs  Lab 07/16/23 0949  AST 22  ALT 24  ALKPHOS 71  BILITOT 0.6  PROT 7.8  ALBUMIN 4.3   Recent Labs  Lab 07/16/23 0949  LIPASE 22  No results for input(s): "AMMONIA" in the last 168 hours. Coagulation Profile: No results for input(s): "INR", "PROTIME" in the last 168 hours. Cardiac Enzymes: No results for input(s): "CKTOTAL", "CKMB", "CKMBINDEX", "TROPONINI" in the last 168 hours. BNP (last 3 results) No results for input(s): "PROBNP" in the last 8760 hours. HbA1C: No results for input(s): "HGBA1C" in the last 72 hours. CBG: No results for input(s): "GLUCAP" in the last 168 hours. Lipid Profile: No results for input(s): "CHOL", "HDL", "LDLCALC", "TRIG", "CHOLHDL", "LDLDIRECT" in the last 72 hours. Thyroid Function Tests: No results for input(s): "TSH", "T4TOTAL", "FREET4", "T3FREE", "THYROIDAB" in the last 72 hours. Anemia Panel: No results for input(s): "VITAMINB12", "FOLATE", "FERRITIN", "TIBC", "IRON", "RETICCTPCT" in the last 72 hours. Most Recent Urinalysis On File:     Component Value Date/Time   COLORURINE YELLOW (A) 07/16/2023 0949   APPEARANCEUR TURBID (A) 07/16/2023 0949   LABSPEC 1.031 (H) 07/16/2023 0949   PHURINE 5.0 07/16/2023 0949   GLUCOSEU NEGATIVE 07/16/2023 0949   HGBUR NEGATIVE 07/16/2023 0949   BILIRUBINUR NEGATIVE 07/16/2023 0949   KETONESUR 5 (A) 07/16/2023 0949   PROTEINUR NEGATIVE 07/16/2023 0949   NITRITE NEGATIVE 07/16/2023 0949   LEUKOCYTESUR TRACE (A) 07/16/2023 0949   Sepsis Labs: @LABRCNTIP (procalcitonin:4,lacticidven:4) Microbiology: Recent Results (from the past 240 hours)  C Difficile Quick Screen w PCR reflex      Status: None   Collection Time: 07/16/23 12:35 PM   Specimen: Urine, Clean Catch; Stool  Result Value Ref Range Status   C Diff antigen NEGATIVE NEGATIVE Final   C Diff toxin NEGATIVE NEGATIVE Final   C Diff interpretation No C. difficile detected.  Final    Comment: Performed at [facility], [address] [place], [place] [address]  Gastrointestinal Panel by PCR , Stool     Status: None   Collection Time: 07/16/23 12:35 PM   Specimen: Urine, Clean Catch; Stool  Result Value Ref Range Status   Campylobacter species NOT DETECTED NOT DETECTED Final   Plesimonas shigelloides NOT DETECTED NOT DETECTED Final   Salmonella species NOT DETECTED NOT DETECTED Final   Yersinia enterocolitica NOT DETECTED NOT DETECTED Final   Vibrio species NOT DETECTED NOT DETECTED Final   Vibrio cholerae NOT DETECTED NOT DETECTED Final   Enteroaggregative E coli (EAEC) NOT DETECTED NOT DETECTED Final   Enteropathogenic E coli (EPEC) NOT DETECTED NOT DETECTED Final   Enterotoxigenic E coli (ETEC) NOT DETECTED NOT DETECTED Final   Shiga like toxin producing E coli (STEC) NOT DETECTED NOT DETECTED Final   Shigella/Enteroinvasive E coli (EIEC) NOT DETECTED NOT DETECTED Final   Cryptosporidium NOT DETECTED NOT DETECTED Final   Cyclospora cayetanensis NOT DETECTED NOT DETECTED Final   Entamoeba histolytica NOT DETECTED NOT DETECTED Final   Giardia lamblia NOT DETECTED NOT DETECTED Final   Adenovirus F40/41 NOT DETECTED NOT DETECTED Final   Astrovirus NOT DETECTED NOT DETECTED Final   Norovirus GI/GII NOT DETECTED NOT DETECTED Final   Rotavirus A NOT DETECTED NOT DETECTED Final   Sapovirus (I, II, IV, and V) NOT DETECTED NOT DETECTED Final    Comment: Performed at [facility], [address] [place], [place] [address]      Radiology Studies last 3 days: CT ABDOMEN PELVIS W CONTRAST Result Date: 07/16/2023 CLINICAL DATA:  Evaluate for small bowel obstruction. Lower abdominal pain with  diarrhea. EXAM: CT ABDOMEN AND PELVIS WITH CONTRAST TECHNIQUE: Multidetector CT imaging of the abdomen and pelvis was performed using the standard protocol following bolus administration  of intravenous contrast. RADIATION DOSE REDUCTION: This exam was performed according to the departmental dose-optimization program which includes automated exposure control, adjustment of the mA and/or kV according to patient size and/or use of iterative reconstruction technique. CONTRAST:  OMNIPAQUE IOHEXOL 300 MG/ML  SOLN COMPARISON:  CT abdomen and pelvis 12/17/2018 FINDINGS: Lower chest: No acute abnormality. Hepatobiliary: No focal liver abnormality is seen. No gallstones, gallbladder wall thickening, or biliary dilatation. Pancreas: Unremarkable. No pancreatic ductal dilatation or surrounding inflammatory changes. Spleen: Normal in size without focal abnormality. Adrenals/Urinary Tract: The bladder is decompressed and not well evaluated. The kidneys and adrenal glands are within normal limits. Stomach/Bowel: There is mild wall thickening of the colon diffusely most significant in the descending colon and transverse colon. There is no surrounding inflammation. The appendix, small bowel and stomach are within normal limits. No acute inflammatory stranding present. Vascular/Lymphatic: No significant vascular findings are present. No enlarged abdominal or pelvic lymph nodes. Reproductive: Status post hysterectomy. No adnexal masses. Other: No abdominal wall hernia or abnormality. No abdominopelvic ascites. Musculoskeletal: No acute osseous findings. Bilateral breast implants are present. IMPRESSION: 1. Mild wall thickening of the colon diffusely most significant in the descending colon and transverse colon, worrisome for infectious/inflammatory colitis. 2. No evidence for bowel obstruction. Electronically Signed   By: Darliss Cheney M.D.   On: 07/16/2023 15:40          Sunnie Nielsen, DO [facility] 07/17/2023, 3:22 PM    Dictation software may have been used to generate the above note. Typos may occur and escape review in typed/dictated notes. Please contact Dr Lyn Hollingshead directly for clarity if needed.  Staff may message me via secure chat in Epic  but this may not receive an immediate response,  please page me for urgent matters!  If 7PM-7AM, please contact night coverage [email or web address]

## 2023-07-18 DIAGNOSIS — F17210 Nicotine dependence, cigarettes, uncomplicated: Secondary | ICD-10-CM | POA: Diagnosis present

## 2023-07-18 DIAGNOSIS — E860 Dehydration: Secondary | ICD-10-CM | POA: Diagnosis present

## 2023-07-18 DIAGNOSIS — F411 Generalized anxiety disorder: Secondary | ICD-10-CM | POA: Diagnosis present

## 2023-07-18 DIAGNOSIS — R824 Acetonuria: Secondary | ICD-10-CM | POA: Diagnosis present

## 2023-07-18 DIAGNOSIS — N179 Acute kidney failure, unspecified: Secondary | ICD-10-CM | POA: Diagnosis present

## 2023-07-18 DIAGNOSIS — Z833 Family history of diabetes mellitus: Secondary | ICD-10-CM | POA: Diagnosis not present

## 2023-07-18 DIAGNOSIS — Z98891 History of uterine scar from previous surgery: Secondary | ICD-10-CM | POA: Diagnosis not present

## 2023-07-18 DIAGNOSIS — Z9889 Other specified postprocedural states: Secondary | ICD-10-CM | POA: Diagnosis not present

## 2023-07-18 DIAGNOSIS — E876 Hypokalemia: Secondary | ICD-10-CM | POA: Diagnosis present

## 2023-07-18 DIAGNOSIS — F331 Major depressive disorder, recurrent, moderate: Secondary | ICD-10-CM | POA: Diagnosis present

## 2023-07-18 DIAGNOSIS — J342 Deviated nasal septum: Secondary | ICD-10-CM | POA: Diagnosis present

## 2023-07-18 DIAGNOSIS — Z888 Allergy status to other drugs, medicaments and biological substances status: Secondary | ICD-10-CM | POA: Diagnosis not present

## 2023-07-18 DIAGNOSIS — K529 Noninfective gastroenteritis and colitis, unspecified: Secondary | ICD-10-CM | POA: Diagnosis present

## 2023-07-18 DIAGNOSIS — Z823 Family history of stroke: Secondary | ICD-10-CM | POA: Diagnosis not present

## 2023-07-18 DIAGNOSIS — Z83511 Family history of glaucoma: Secondary | ICD-10-CM | POA: Diagnosis not present

## 2023-07-18 DIAGNOSIS — F445 Conversion disorder with seizures or convulsions: Secondary | ICD-10-CM | POA: Diagnosis present

## 2023-07-18 DIAGNOSIS — Z90710 Acquired absence of both cervix and uterus: Secondary | ICD-10-CM | POA: Diagnosis not present

## 2023-07-18 DIAGNOSIS — J439 Emphysema, unspecified: Secondary | ICD-10-CM | POA: Diagnosis present

## 2023-07-18 DIAGNOSIS — Z79899 Other long term (current) drug therapy: Secondary | ICD-10-CM | POA: Diagnosis not present

## 2023-07-18 DIAGNOSIS — G43909 Migraine, unspecified, not intractable, without status migrainosus: Secondary | ICD-10-CM | POA: Diagnosis present

## 2023-07-18 DIAGNOSIS — E785 Hyperlipidemia, unspecified: Secondary | ICD-10-CM | POA: Diagnosis present

## 2023-07-18 DIAGNOSIS — Z9151 Personal history of suicidal behavior: Secondary | ICD-10-CM | POA: Diagnosis not present

## 2023-07-18 DIAGNOSIS — E872 Acidosis, unspecified: Secondary | ICD-10-CM | POA: Diagnosis present

## 2023-07-18 DIAGNOSIS — Z8249 Family history of ischemic heart disease and other diseases of the circulatory system: Secondary | ICD-10-CM | POA: Diagnosis not present

## 2023-07-18 LAB — CBC
HCT: 36.9 % (ref 36.0–46.0)
Hemoglobin: 12.6 g/dL (ref 12.0–15.0)
MCH: 31.3 pg (ref 26.0–34.0)
MCHC: 34.1 g/dL (ref 30.0–36.0)
MCV: 91.6 fL (ref 80.0–100.0)
Platelets: 198 10*3/uL (ref 150–400)
RBC: 4.03 MIL/uL (ref 3.87–5.11)
RDW: 12.1 % (ref 11.5–15.5)
WBC: 7.2 10*3/uL (ref 4.0–10.5)
nRBC: 0 % (ref 0.0–0.2)

## 2023-07-18 LAB — BASIC METABOLIC PANEL WITH GFR
Anion gap: 7 (ref 5–15)
BUN: 10 mg/dL (ref 6–20)
CO2: 24 mmol/L (ref 22–32)
Calcium: 8.5 mg/dL — ABNORMAL LOW (ref 8.9–10.3)
Chloride: 108 mmol/L (ref 98–111)
Creatinine, Ser: 1.05 mg/dL — ABNORMAL HIGH (ref 0.44–1.00)
GFR, Estimated: >60 mL/min (ref >60)
Glucose, Bld: 84 mg/dL (ref 70–99)
Potassium: 3.3 mmol/L — ABNORMAL LOW (ref 3.5–5.1)
Sodium: 139 mmol/L (ref 135–145)

## 2023-07-18 MED ORDER — POTASSIUM CHLORIDE CRYS ER 20 MEQ PO TBCR
40.0000 meq | EXTENDED_RELEASE_TABLET | Freq: Once | ORAL | Status: AC
  Administered 2023-07-18: 40 meq via ORAL
  Filled 2023-07-18: qty 2

## 2023-07-18 MED ORDER — LOPERAMIDE HCL 2 MG PO CAPS
4.0000 mg | ORAL_CAPSULE | Freq: Three times a day (TID) | ORAL | Status: DC
  Administered 2023-07-18 – 2023-07-20 (×3): 4 mg via ORAL
  Filled 2023-07-18 (×4): qty 2

## 2023-07-18 MED ORDER — DICYCLOMINE HCL 20 MG PO TABS
20.0000 mg | ORAL_TABLET | Freq: Three times a day (TID) | ORAL | Status: DC
  Administered 2023-07-18 – 2023-07-20 (×7): 20 mg via ORAL
  Filled 2023-07-18 (×9): qty 1

## 2023-07-18 NOTE — Progress Notes (Signed)
 PROGRESS NOTE    Nancy Porter   XBJ:[id number] DOB: 1971-04-02  DOA: 07/16/2023 Date of Service: 07/18/23 which is hospital day 0  PCP: Mick Sell, MD    Hospital course / significant events:   HPI: Nancy Porter is a 53 y.o. female with medical history significant of COPD, pseudoseizures, anxiety/depression, and HLD who presented on 4/1 with abdominal pain.  She reports that she awoke on Sunday (3/30) am with abdominal pain and had a loose BM.  She attributed it to pizza from the night before.  They went to a garden store and about 90 minutes after the first episode she had explosive diarrhea.  Since then, she has had diarrhea about hourly, day and night.  No sleep.  Abdominal pain with severe cramping before episodes.  Minimal PO intake.  No fevers.  No sick contacts.   04/01: Labs with WBC elevation, CT with colitis. Given IV abx. She doesn't think she will be able to tolerate PO abx and so wants to stay. Admitted to hospitalist service under observation status  04/02-04/03: remains significant abd pain and po intolerance, diarrhea. Continue IV fluids     Consultants:  none  Procedures/Surgeries: none      ASSESSMENT & PLAN:   Colitis NO sepsis - only SIRS criteria is leukocytosis (13.9) GI PCR and CDiff neg  CLD advance as able/tolerated  IV fluids pending improvement in po intake  pain tx with morphine, oxycodone nausea tx with Zofran Abx tx with Zosyn  Imodium + Bentyl pre-meal  Consider repeat imaging +/- GI consultation if worse/not resolving  Hypokalemia Replace as needed Monitor BMP   Mild dehydration Ketonuria, mild metabolic acidosis Likely resulting from dehydration associated with GI losses Anticipate resolution with IVF Monitor    COPD/Tobacco dependence Reports no tobacco use since Sunday Will order nicotine patch Cessation encouraged Continue Trelegy, albuterol prn   Anxiety/depression Continue Klonopin, nortriptyline,  sertraline, Abilify, topamax   HLD Not currently on medication Follow outpatient     No concerns based on BMI: Body mass index is 24.75 kg/m.  Underweight - under 18  overweight - 25 to 29 obese - 30 or more Class 1 obesity: BMI of 30.0 to 34 Class 2 obesity: BMI of 35.0 to 39 Class 3 obesity: BMI of 40.0 to 49 Super Morbid Obesity: BMI 50-59 Super-super Morbid Obesity: BMI 60+ Significantly low or high BMI is associated with higher medical risk.  Weight management advised as adjunct to other disease management and risk reduction treatments    DVT prophylaxis: lovenox  IV fluids: LR continuous IV fluids  Nutrition: CLD advance as tolerated / as able  Central lines / other devices: none  Code Status: FULL CODE ACP documentation reviewed:  none on file in [facility]  TOC needs: TBD Medical barriers to dispo: needing IV fluids, unable to tolerate po. Expected medical readiness for discharge 1-2 days .              Subjective / Brief ROS:  Patient reports abd pain is persistent about same as yesterday maybe slightly improved  She is not getting up out of bed except to bathroom  Frequent loose stool worse following meals   Denies CP/SOB.  Pain controlled decently well w/ meds.  Denies new weakness.  Tolerating very minimal po intake, seems to trigger abd pain.  Reports no concerns w/ urination  Family Communication: family at bedside on rounds     Objective Findings:  Vitals:   07/17/23 2045  07/18/23 0338 07/18/23 0729 07/18/23 0942  BP: 90/62 103/63 114/68 91/60  Pulse: (!) 59 (!) 58 64 70  Resp: 20 18 16 18   Temp: 98.3 F (36.8 C) 97.6 F (36.4 C) 98.9 F (37.2 C) 97.9 F (36.6 C)  TempSrc: Oral   Oral  SpO2: 99% 98% 99% 99%  Weight:      Height:        Intake/Output Summary (Last 24 hours) at 07/18/2023 1307 Last data filed at 07/18/2023 0900 Gross per 24 hour  Intake 720 ml  Output --  Net 720 ml   Filed Weights   07/16/23 0942  Weight: 71.7  kg    Examination:  Physical Exam Constitutional:      General: She is not in acute distress.    Appearance: She is not ill-appearing.  Cardiovascular:     Rate and Rhythm: Normal rate and regular rhythm.  Pulmonary:     Effort: Pulmonary effort is normal.     Breath sounds: Normal breath sounds.  Abdominal:     General: Bowel sounds are increased. There is no distension.     Palpations: Abdomen is soft.     Tenderness: There is generalized abdominal tenderness. There is no guarding or rebound.  Skin:    General: Skin is warm and dry.  Neurological:     General: No focal deficit present.     Mental Status: She is alert and oriented to person, place, and time.  Psychiatric:        Mood and Affect: Mood normal.        Behavior: Behavior normal.          Scheduled Medications:   dicyclomine  20 mg Oral TID AC & HS   enoxaparin (LOVENOX) injection  40 mg Subcutaneous Q24H   fluticasone furoate-vilanterol  1 puff Inhalation Daily   And   umeclidinium bromide  1 puff Inhalation Daily   loperamide  4 mg Oral TID   nicotine  14 mg Transdermal Daily   nortriptyline  50 mg Oral QHS   sertraline  50 mg Oral Daily   topiramate  100 mg Oral BID    Continuous Infusions:  lactated ringers 75 mL/hr at 07/18/23 0257   piperacillin-tazobactam (ZOSYN)  IV 3.375 g (07/18/23 0906)    PRN Medications:  acetaminophen **OR** acetaminophen, albuterol, clonazePAM, hydrALAZINE, morphine injection, ondansetron **OR** ondansetron (ZOFRAN) IV, oxyCODONE  Antimicrobials from admission:  Anti-infectives (From admission, onward)    Start     Dose/Rate Route Frequency Ordered Stop   07/17/23 0000  piperacillin-tazobactam (ZOSYN) IVPB 3.375 g        3.375 g 12.5 mL/hr over 240 Minutes Intravenous Every 8 hours 07/16/23 2147     07/16/23 1615  piperacillin-tazobactam (ZOSYN) IVPB 3.375 g        3.375 g 100 mL/hr over 30 Minutes Intravenous  Once 07/16/23 1613 07/16/23 1750            Data Reviewed:  I have personally reviewed the following...  CBC: Recent Labs  Lab 07/16/23 0949 07/17/23 0152 07/18/23 0430  WBC 13.9* 10.6* 7.2  HGB 15.7* 13.4 12.6  HCT 46.6* 39.1 36.9  MCV 93.2 92.4 91.6  PLT 266 214 198   Basic Metabolic Panel: Recent Labs  Lab 07/16/23 0949 07/17/23 0152 07/18/23 0430  NA 138 134* 139  K 3.3* 3.5 3.3*  CL 109 104 108  CO2 18* 22 24  GLUCOSE 119* 84 84  BUN 16 14 10  CREATININE 0.89 0.78 1.05*  CALCIUM 9.7 8.4* 8.5*   GFR: Estimated Creatinine Clearance: 58.9 mL/min (A) (by C-G formula based on SCr of 1.05 mg/dL (H)). Liver Function Tests: Recent Labs  Lab 07/16/23 0949  AST 22  ALT 24  ALKPHOS 71  BILITOT 0.6  PROT 7.8  ALBUMIN 4.3   Recent Labs  Lab 07/16/23 0949  LIPASE 22   No results for input(s): "AMMONIA" in the last 168 hours. Coagulation Profile: No results for input(s): "INR", "PROTIME" in the last 168 hours. Cardiac Enzymes: No results for input(s): "CKTOTAL", "CKMB", "CKMBINDEX", "TROPONINI" in the last 168 hours. BNP (last 3 results) No results for input(s): "PROBNP" in the last 8760 hours. HbA1C: No results for input(s): "HGBA1C" in the last 72 hours. CBG: No results for input(s): "GLUCAP" in the last 168 hours. Lipid Profile: No results for input(s): "CHOL", "HDL", "LDLCALC", "TRIG", "CHOLHDL", "LDLDIRECT" in the last 72 hours. Thyroid Function Tests: No results for input(s): "TSH", "T4TOTAL", "FREET4", "T3FREE", "THYROIDAB" in the last 72 hours. Anemia Panel: No results for input(s): "VITAMINB12", "FOLATE", "FERRITIN", "TIBC", "IRON", "RETICCTPCT" in the last 72 hours. Most Recent Urinalysis On File:     Component Value Date/Time   COLORURINE YELLOW (A) 07/16/2023 0949   APPEARANCEUR TURBID (A) 07/16/2023 0949   LABSPEC 1.031 (H) 07/16/2023 0949   PHURINE 5.0 07/16/2023 0949   GLUCOSEU NEGATIVE 07/16/2023 0949   HGBUR NEGATIVE 07/16/2023 0949   BILIRUBINUR NEGATIVE 07/16/2023  0949   KETONESUR 5 (A) 07/16/2023 0949   PROTEINUR NEGATIVE 07/16/2023 0949   NITRITE NEGATIVE 07/16/2023 0949   LEUKOCYTESUR TRACE (A) 07/16/2023 0949   Sepsis Labs: @LABRCNTIP (procalcitonin:4,lacticidven:4) Microbiology: Recent Results (from the past 240 hours)  C Difficile Quick Screen w PCR reflex     Status: None   Collection Time: 07/16/23 12:35 PM   Specimen: Urine, Clean Catch; Stool  Result Value Ref Range Status   C Diff antigen NEGATIVE NEGATIVE Final   C Diff toxin NEGATIVE NEGATIVE Final   C Diff interpretation No C. difficile detected.  Final    Comment: Performed at [facility], [address] [place], [place] [address]  Gastrointestinal Panel by PCR , Stool     Status: None   Collection Time: 07/16/23 12:35 PM   Specimen: Urine, Clean Catch; Stool  Result Value Ref Range Status   Campylobacter species NOT DETECTED NOT DETECTED Final   Plesimonas shigelloides NOT DETECTED NOT DETECTED Final   Salmonella species NOT DETECTED NOT DETECTED Final   Yersinia enterocolitica NOT DETECTED NOT DETECTED Final   Vibrio species NOT DETECTED NOT DETECTED Final   Vibrio cholerae NOT DETECTED NOT DETECTED Final   Enteroaggregative E coli (EAEC) NOT DETECTED NOT DETECTED Final   Enteropathogenic E coli (EPEC) NOT DETECTED NOT DETECTED Final   Enterotoxigenic E coli (ETEC) NOT DETECTED NOT DETECTED Final   Shiga like toxin producing E coli (STEC) NOT DETECTED NOT DETECTED Final   Shigella/Enteroinvasive E coli (EIEC) NOT DETECTED NOT DETECTED Final   Cryptosporidium NOT DETECTED NOT DETECTED Final   Cyclospora cayetanensis NOT DETECTED NOT DETECTED Final   Entamoeba histolytica NOT DETECTED NOT DETECTED Final   Giardia lamblia NOT DETECTED NOT DETECTED Final   Adenovirus F40/41 NOT DETECTED NOT DETECTED Final   Astrovirus NOT DETECTED NOT DETECTED Final   Norovirus GI/GII NOT DETECTED NOT DETECTED Final   Rotavirus A NOT DETECTED NOT DETECTED Final   Sapovirus (I,  II, IV, and V) NOT DETECTED NOT DETECTED Final    Comment:  Performed at [facility], [address] [place], [place] [address]      Radiology Studies last 3 days: CT ABDOMEN PELVIS W CONTRAST Result Date: 07/16/2023 CLINICAL DATA:  Evaluate for small bowel obstruction. Lower abdominal pain with diarrhea. EXAM: CT ABDOMEN AND PELVIS WITH CONTRAST TECHNIQUE: Multidetector CT imaging of the abdomen and pelvis was performed using the standard protocol following bolus administration of intravenous contrast. RADIATION DOSE REDUCTION: This exam was performed according to the departmental dose-optimization program which includes automated exposure control, adjustment of the mA and/or kV according to patient size and/or use of iterative reconstruction technique. CONTRAST:  OMNIPAQUE IOHEXOL 300 MG/ML  SOLN COMPARISON:  CT abdomen and pelvis 12/17/2018 FINDINGS: Lower chest: No acute abnormality. Hepatobiliary: No focal liver abnormality is seen. No gallstones, gallbladder wall thickening, or biliary dilatation. Pancreas: Unremarkable. No pancreatic ductal dilatation or surrounding inflammatory changes. Spleen: Normal in size without focal abnormality. Adrenals/Urinary Tract: The bladder is decompressed and not well evaluated. The kidneys and adrenal glands are within normal limits. Stomach/Bowel: There is mild wall thickening of the colon diffusely most significant in the descending colon and transverse colon. There is no surrounding inflammation. The appendix, small bowel and stomach are within normal limits. No acute inflammatory stranding present. Vascular/Lymphatic: No significant vascular findings are present. No enlarged abdominal or pelvic lymph nodes. Reproductive: Status post hysterectomy. No adnexal masses. Other: No abdominal wall hernia or abnormality. No abdominopelvic ascites. Musculoskeletal: No acute osseous findings. Bilateral breast implants are present. IMPRESSION: 1. Mild wall  thickening of the colon diffusely most significant in the descending colon and transverse colon, worrisome for infectious/inflammatory colitis. 2. No evidence for bowel obstruction. Electronically Signed   By: Darliss Cheney M.D.   On: 07/16/2023 15:40          Sunnie Nielsen, DO [facility] 07/18/2023, 1:07 PM    Dictation software may have been used to generate the above note. Typos may occur and escape review in typed/dictated notes. Please contact Dr Lyn Hollingshead directly for clarity if needed.  Staff may message me via secure chat in Epic  but this may not receive an immediate response,  please page me for urgent matters!  If 7PM-7AM, please contact night coverage [email or web address]

## 2023-07-19 ENCOUNTER — Inpatient Hospital Stay

## 2023-07-19 DIAGNOSIS — K529 Noninfective gastroenteritis and colitis, unspecified: Secondary | ICD-10-CM | POA: Diagnosis not present

## 2023-07-19 LAB — CBC
HCT: 37.8 % (ref 36.0–46.0)
Hemoglobin: 13.1 g/dL (ref 12.0–15.0)
MCH: 31.5 pg (ref 26.0–34.0)
MCHC: 34.7 g/dL (ref 30.0–36.0)
MCV: 90.9 fL (ref 80.0–100.0)
Platelets: 219 10*3/uL (ref 150–400)
RBC: 4.16 MIL/uL (ref 3.87–5.11)
RDW: 12.1 % (ref 11.5–15.5)
WBC: 7.2 10*3/uL (ref 4.0–10.5)
nRBC: 0 % (ref 0.0–0.2)

## 2023-07-19 LAB — BASIC METABOLIC PANEL WITH GFR
Anion gap: 7 (ref 5–15)
BUN: 8 mg/dL (ref 6–20)
CO2: 23 mmol/L (ref 22–32)
Calcium: 8.8 mg/dL — ABNORMAL LOW (ref 8.9–10.3)
Chloride: 110 mmol/L (ref 98–111)
Creatinine, Ser: 0.88 mg/dL (ref 0.44–1.00)
GFR, Estimated: >60 mL/min (ref >60)
Glucose, Bld: 91 mg/dL (ref 70–99)
Potassium: 3.4 mmol/L — ABNORMAL LOW (ref 3.5–5.1)
Sodium: 140 mmol/L (ref 135–145)

## 2023-07-19 MED ORDER — IOHEXOL 9 MG/ML PO SOLN
500.0000 mL | ORAL | Status: AC
  Administered 2023-07-19 (×2): 500 mL via ORAL

## 2023-07-19 MED ORDER — IOHEXOL 300 MG/ML  SOLN
100.0000 mL | Freq: Once | INTRAMUSCULAR | Status: AC | PRN
  Administered 2023-07-19: 100 mL via INTRAVENOUS

## 2023-07-19 NOTE — Progress Notes (Signed)
 PROGRESS NOTE    OLA RAAP   NWG:[id number] DOB: 04/19/1970  DOA: 07/16/2023 Date of Service: 07/19/23 which is hospital day 1  PCP: Mick Sell, MD    Hospital course / significant events:   HPI: Nancy Porter is a 53 y.o. female with medical history significant of COPD, pseudoseizures, anxiety/depression, and HLD who presented on 4/1 with abdominal pain.  She reports that she awoke on Sunday (3/30) am with abdominal pain and had a loose BM.  She attributed it to pizza from the night before.  They went to a garden store and about 90 minutes after the first episode she had explosive diarrhea.  Since then, she has had diarrhea about hourly, day and night.  No sleep.  Abdominal pain with severe cramping before episodes.  Minimal PO intake.  No fevers.  No sick contacts.   04/01: Labs with WBC elevation, CT with colitis. Given IV abx. She doesn't think she will be able to tolerate PO abx and so wants to stay. Admitted to hospitalist service under observation status  04/02-04/03: remains significant abd pain and po intolerance, diarrhea. Continue IV fluids  04/04: reports pain is worse today, diarrhea is improved ubt not resolved. CT abd repeat today, d/w GI, Dr Tobi Bastos will see tomorrow unless urgently needed to see sooner     Consultants:  none  Procedures/Surgeries: none      ASSESSMENT & PLAN:   Colitis NO sepsis - only SIRS criteria is leukocytosis (13.9) GI PCR and CDiff neg  CLD advance as able/tolerated  IV fluids pending improvement in po intake  pain tx with morphine, oxycodone nausea tx with Zofran Abx tx with Zosyn  Imodium + Bentyl pre-meal as needed for cramping/diarrhea repeat imaging today GI will see her tomorrow if worse/not resolving  Hypokalemia Replace as needed Monitor BMP   Mild dehydration, AKI - resolved Ketonuria, mild metabolic acidosis Likely resulting from dehydration associated with GI losses Monitor    COPD/Tobacco  dependence Reports no tobacco use since Sunday Will order nicotine patch Cessation encouraged Continue Trelegy, albuterol prn   Anxiety/depression Continue Klonopin, nortriptyline, sertraline, Abilify, topamax   HLD Not currently on medication Follow outpatient     No concerns based on BMI: Body mass index is 24.75 kg/m.  Underweight - under 18  overweight - 25 to 29 obese - 30 or more Class 1 obesity: BMI of 30.0 to 34 Class 2 obesity: BMI of 35.0 to 39 Class 3 obesity: BMI of 40.0 to 49 Super Morbid Obesity: BMI 50-59 Super-super Morbid Obesity: BMI 60+ Significantly low or high BMI is associated with higher medical risk.  Weight management advised as adjunct to other disease management and risk reduction treatments    DVT prophylaxis: lovenox  IV fluids: LR continuous IV fluids  Nutrition: CLD advance as tolerated / as able  Central lines / other devices: none  Code Status: FULL CODE ACP documentation reviewed:  none on file in [facility]  TOC needs: TBD Medical barriers to dispo: needing IV fluids, unable to tolerate po. Expected medical readiness for discharge 1-2 days .              Subjective / Brief ROS:  Patient reports abd pain is worse today She is walking some  Frequent loose stool worse following meals has improved a bit  Denies CP/SOB.  Denies new weakness.  Tolerating very minimal po intake, seems to trigger abd pain.  Reports no concerns w/ urination  Family Communication: family  at bedside on rounds     Objective Findings:  Vitals:   07/18/23 1532 07/18/23 2044 07/19/23 0350 07/19/23 0740  BP: (!) 90/51 90/61 92/60  101/63  Pulse: 62 (!) 55 (!) 54 (!) 57  Resp: 17 18 16 15   Temp: 98 F (36.7 C) 98.1 F (36.7 C) 97.9 F (36.6 C) (!) 97.5 F (36.4 C)  TempSrc:    Oral  SpO2: 100% 99% 96% 94%  Weight:      Height:        Intake/Output Summary (Last 24 hours) at 07/19/2023 1258 Last data filed at 07/18/2023 1300 Gross per 24 hour   Intake 120 ml  Output --  Net 120 ml   Filed Weights   07/16/23 0942  Weight: 71.7 kg    Examination:  Physical Exam Constitutional:      General: She is not in acute distress.    Appearance: She is not ill-appearing.  Cardiovascular:     Rate and Rhythm: Normal rate and regular rhythm.  Pulmonary:     Effort: Pulmonary effort is normal.     Breath sounds: Normal breath sounds.  Abdominal:     General: Bowel sounds are increased. There is no distension.     Palpations: Abdomen is soft.     Tenderness: There is generalized abdominal tenderness. There is no guarding or rebound.  Skin:    General: Skin is warm and dry.  Neurological:     General: No focal deficit present.     Mental Status: She is alert and oriented to person, place, and time.  Psychiatric:        Mood and Affect: Mood normal.        Behavior: Behavior normal.          Scheduled Medications:   dicyclomine  20 mg Oral TID AC & HS   enoxaparin (LOVENOX) injection  40 mg Subcutaneous Q24H   fluticasone furoate-vilanterol  1 puff Inhalation Daily   And   umeclidinium bromide  1 puff Inhalation Daily   iohexol  500 mL Oral Q1H   loperamide  4 mg Oral TID   nicotine  14 mg Transdermal Daily   nortriptyline  50 mg Oral QHS   sertraline  50 mg Oral Daily   topiramate  100 mg Oral BID    Continuous Infusions:  lactated ringers 75 mL/hr at 07/19/23 1006   piperacillin-tazobactam (ZOSYN)  IV 3.375 g (07/19/23 0922)    PRN Medications:  acetaminophen **OR** acetaminophen, albuterol, clonazePAM, hydrALAZINE, morphine injection, ondansetron **OR** ondansetron (ZOFRAN) IV, oxyCODONE  Antimicrobials from admission:  Anti-infectives (From admission, onward)    Start     Dose/Rate Route Frequency Ordered Stop   07/17/23 0000  piperacillin-tazobactam (ZOSYN) IVPB 3.375 g        3.375 g 12.5 mL/hr over 240 Minutes Intravenous Every 8 hours 07/16/23 2147     07/16/23 1615  piperacillin-tazobactam (ZOSYN)  IVPB 3.375 g        3.375 g 100 mL/hr over 30 Minutes Intravenous  Once 07/16/23 1613 07/16/23 1750           Data Reviewed:  I have personally reviewed the following...  CBC: Recent Labs  Lab 07/16/23 0949 07/17/23 0152 07/18/23 0430 07/19/23 0522  WBC 13.9* 10.6* 7.2 7.2  HGB 15.7* 13.4 12.6 13.1  HCT 46.6* 39.1 36.9 37.8  MCV 93.2 92.4 91.6 90.9  PLT 266 214 198 219   Basic Metabolic Panel: Recent Labs  Lab 07/16/23 0949 07/17/23  1610 07/18/23 0430 07/19/23 0522  NA 138 134* 139 140  K 3.3* 3.5 3.3* 3.4*  CL 109 104 108 110  CO2 18* 22 24 23   GLUCOSE 119* 84 84 91  BUN 16 14 10 8   CREATININE 0.89 0.78 1.05* 0.88  CALCIUM 9.7 8.4* 8.5* 8.8*   GFR: Estimated Creatinine Clearance: 70.2 mL/min (by C-G formula based on SCr of 0.88 mg/dL). Liver Function Tests: Recent Labs  Lab 07/16/23 0949  AST 22  ALT 24  ALKPHOS 71  BILITOT 0.6  PROT 7.8  ALBUMIN 4.3   Recent Labs  Lab 07/16/23 0949  LIPASE 22   No results for input(s): "AMMONIA" in the last 168 hours. Coagulation Profile: No results for input(s): "INR", "PROTIME" in the last 168 hours. Cardiac Enzymes: No results for input(s): "CKTOTAL", "CKMB", "CKMBINDEX", "TROPONINI" in the last 168 hours. BNP (last 3 results) No results for input(s): "PROBNP" in the last 8760 hours. HbA1C: No results for input(s): "HGBA1C" in the last 72 hours. CBG: No results for input(s): "GLUCAP" in the last 168 hours. Lipid Profile: No results for input(s): "CHOL", "HDL", "LDLCALC", "TRIG", "CHOLHDL", "LDLDIRECT" in the last 72 hours. Thyroid Function Tests: No results for input(s): "TSH", "T4TOTAL", "FREET4", "T3FREE", "THYROIDAB" in the last 72 hours. Anemia Panel: No results for input(s): "VITAMINB12", "FOLATE", "FERRITIN", "TIBC", "IRON", "RETICCTPCT" in the last 72 hours. Most Recent Urinalysis On File:     Component Value Date/Time   COLORURINE YELLOW (A) 07/16/2023 0949   APPEARANCEUR TURBID (A)  07/16/2023 0949   LABSPEC 1.031 (H) 07/16/2023 0949   PHURINE 5.0 07/16/2023 0949   GLUCOSEU NEGATIVE 07/16/2023 0949   HGBUR NEGATIVE 07/16/2023 0949   BILIRUBINUR NEGATIVE 07/16/2023 0949   KETONESUR 5 (A) 07/16/2023 0949   PROTEINUR NEGATIVE 07/16/2023 0949   NITRITE NEGATIVE 07/16/2023 0949   LEUKOCYTESUR TRACE (A) 07/16/2023 0949   Sepsis Labs: @LABRCNTIP (procalcitonin:4,lacticidven:4) Microbiology: Recent Results (from the past 240 hours)  C Difficile Quick Screen w PCR reflex     Status: None   Collection Time: 07/16/23 12:35 PM   Specimen: Urine, Clean Catch; Stool  Result Value Ref Range Status   C Diff antigen NEGATIVE NEGATIVE Final   C Diff toxin NEGATIVE NEGATIVE Final   C Diff interpretation No C. difficile detected.  Final    Comment: Performed at [facility], [address] [place], [place] [address]  Gastrointestinal Panel by PCR , Stool     Status: None   Collection Time: 07/16/23 12:35 PM   Specimen: Urine, Clean Catch; Stool  Result Value Ref Range Status   Campylobacter species NOT DETECTED NOT DETECTED Final   Plesimonas shigelloides NOT DETECTED NOT DETECTED Final   Salmonella species NOT DETECTED NOT DETECTED Final   Yersinia enterocolitica NOT DETECTED NOT DETECTED Final   Vibrio species NOT DETECTED NOT DETECTED Final   Vibrio cholerae NOT DETECTED NOT DETECTED Final   Enteroaggregative E coli (EAEC) NOT DETECTED NOT DETECTED Final   Enteropathogenic E coli (EPEC) NOT DETECTED NOT DETECTED Final   Enterotoxigenic E coli (ETEC) NOT DETECTED NOT DETECTED Final   Shiga like toxin producing E coli (STEC) NOT DETECTED NOT DETECTED Final   Shigella/Enteroinvasive E coli (EIEC) NOT DETECTED NOT DETECTED Final   Cryptosporidium NOT DETECTED NOT DETECTED Final   Cyclospora cayetanensis NOT DETECTED NOT DETECTED Final   Entamoeba histolytica NOT DETECTED NOT DETECTED Final   Giardia lamblia NOT DETECTED NOT DETECTED Final   Adenovirus F40/41 NOT  DETECTED NOT DETECTED Final  Astrovirus NOT DETECTED NOT DETECTED Final   Norovirus GI/GII NOT DETECTED NOT DETECTED Final   Rotavirus A NOT DETECTED NOT DETECTED Final   Sapovirus (I, II, IV, and V) NOT DETECTED NOT DETECTED Final    Comment: Performed at [facility], [address], [place], [place] [address]      Radiology Studies last 3 days: CT ABDOMEN PELVIS W CONTRAST Result Date: 07/16/2023 CLINICAL DATA:  Evaluate for small bowel obstruction. Lower abdominal pain with diarrhea. EXAM: CT ABDOMEN AND PELVIS WITH CONTRAST TECHNIQUE: Multidetector CT imaging of the abdomen and pelvis was performed using the standard protocol following bolus administration of intravenous contrast. RADIATION DOSE REDUCTION: This exam was performed according to the departmental dose-optimization program which includes automated exposure control, adjustment of the mA and/or kV according to patient size and/or use of iterative reconstruction technique. CONTRAST:  OMNIPAQUE IOHEXOL 300 MG/ML  SOLN COMPARISON:  CT abdomen and pelvis 12/17/2018 FINDINGS: Lower chest: No acute abnormality. Hepatobiliary: No focal liver abnormality is seen. No gallstones, gallbladder wall thickening, or biliary dilatation. Pancreas: Unremarkable. No pancreatic ductal dilatation or surrounding inflammatory changes. Spleen: Normal in size without focal abnormality. Adrenals/Urinary Tract: The bladder is decompressed and not well evaluated. The kidneys and adrenal glands are within normal limits. Stomach/Bowel: There is mild wall thickening of the colon diffusely most significant in the descending colon and transverse colon. There is no surrounding inflammation. The appendix, small bowel and stomach are within normal limits. No acute inflammatory stranding present. Vascular/Lymphatic: No significant vascular findings are present. No enlarged abdominal or pelvic lymph nodes. Reproductive: Status post hysterectomy. No adnexal  masses. Other: No abdominal wall hernia or abnormality. No abdominopelvic ascites. Musculoskeletal: No acute osseous findings. Bilateral breast implants are present. IMPRESSION: 1. Mild wall thickening of the colon diffusely most significant in the descending colon and transverse colon, worrisome for infectious/inflammatory colitis. 2. No evidence for bowel obstruction. Electronically Signed   By: Darliss Cheney M.D.   On: 07/16/2023 15:40          Sunnie Nielsen, DO [facility] 07/19/2023, 12:58 PM    Dictation software may have been used to generate the above note. Typos may occur and escape review in typed/dictated notes. Please contact Dr Lyn Hollingshead directly for clarity if needed.  Staff may message me via secure chat in Epic  but this may not receive an immediate response,  please page me for urgent matters!  If 7PM-7AM, please contact night coverage [email or web address]

## 2023-07-19 NOTE — Plan of Care (Signed)

## 2023-07-20 DIAGNOSIS — K529 Noninfective gastroenteritis and colitis, unspecified: Secondary | ICD-10-CM | POA: Diagnosis not present

## 2023-07-20 LAB — BASIC METABOLIC PANEL WITH GFR
Anion gap: 6 (ref 5–15)
BUN: 7 mg/dL (ref 6–20)
CO2: 24 mmol/L (ref 22–32)
Calcium: 8.8 mg/dL — ABNORMAL LOW (ref 8.9–10.3)
Chloride: 110 mmol/L (ref 98–111)
Creatinine, Ser: 0.96 mg/dL (ref 0.44–1.00)
GFR, Estimated: >60 mL/min (ref >60)
Glucose, Bld: 103 mg/dL — ABNORMAL HIGH (ref 70–99)
Potassium: 3.5 mmol/L (ref 3.5–5.1)
Sodium: 140 mmol/L (ref 135–145)

## 2023-07-20 LAB — HEPATIC FUNCTION PANEL
ALT: 42 U/L (ref 0–44)
AST: 54 U/L — ABNORMAL HIGH (ref 15–41)
Albumin: 3.2 g/dL — ABNORMAL LOW (ref 3.5–5.0)
Alkaline Phosphatase: 51 U/L (ref 38–126)
Bilirubin, Direct: <0.1 mg/dL (ref 0.0–0.2)
Total Bilirubin: 0.5 mg/dL (ref 0.0–1.2)
Total Protein: 5.8 g/dL — ABNORMAL LOW (ref 6.5–8.1)

## 2023-07-20 LAB — CBC
HCT: 36.8 % (ref 36.0–46.0)
Hemoglobin: 12.5 g/dL (ref 12.0–15.0)
MCH: 30.9 pg (ref 26.0–34.0)
MCHC: 34 g/dL (ref 30.0–36.0)
MCV: 90.9 fL (ref 80.0–100.0)
Platelets: 226 10*3/uL (ref 150–400)
RBC: 4.05 MIL/uL (ref 3.87–5.11)
RDW: 12.2 % (ref 11.5–15.5)
WBC: 7.5 10*3/uL (ref 4.0–10.5)
nRBC: 0 % (ref 0.0–0.2)

## 2023-07-20 MED ORDER — ONDANSETRON HCL 4 MG PO TABS: 4.0000 mg | ORAL_TABLET | Freq: Four times a day (QID) | ORAL | 0 refills | Status: AC | PRN

## 2023-07-20 MED ORDER — ACETAMINOPHEN 325 MG PO TABS: 650.0000 mg | ORAL_TABLET | Freq: Four times a day (QID) | ORAL | Status: AC | PRN

## 2023-07-20 MED ORDER — OXYCODONE HCL 5 MG PO TABS: 5.0000 mg | ORAL_TABLET | Freq: Four times a day (QID) | ORAL | 0 refills | Status: AC | PRN

## 2023-07-20 MED ORDER — AMOXICILLIN-POT CLAVULANATE 875-125 MG PO TABS: 1.0000 | ORAL_TABLET | Freq: Two times a day (BID) | ORAL | 0 refills | Status: AC

## 2023-07-20 MED ORDER — FLUCONAZOLE 150 MG PO TABS: 150.0000 mg | ORAL_TABLET | Freq: Once | ORAL | 0 refills | Status: AC

## 2023-07-20 MED ORDER — DICYCLOMINE HCL 20 MG PO TABS
10.0000 mg | ORAL_TABLET | Freq: Three times a day (TID) | ORAL | 0 refills | Status: AC | PRN
Start: 2023-07-20 — End: ?

## 2023-07-20 MED ORDER — LOPERAMIDE HCL 2 MG PO CAPS: 4.0000 mg | ORAL_CAPSULE | Freq: Three times a day (TID) | ORAL | 0 refills | Status: AC | PRN

## 2023-07-20 NOTE — Plan of Care (Signed)
  Problem: Clinical Measurements: Goal: Ability to maintain clinical measurements within normal limits will improve Outcome: Progressing   Problem: Activity: Goal: Risk for activity intolerance will decrease Outcome: Progressing   Problem: Pain Managment: Goal: General experience of comfort will improve and/or be controlled Outcome: Progressing   Problem: Safety: Goal: Ability to remain free from injury will improve Outcome: Progressing

## 2023-07-20 NOTE — Discharge Summary (Signed)
 Physician Discharge Summary   Patient: Nancy Porter MRN: [id number]  DOB: [birth year]   Admit:     Date of Admission: 07/16/2023 Admitted from: home   Discharge: Date of discharge: 07/20/23 Disposition: Home Condition at discharge: good  CODE STATUS: FULL CODE     Discharge Physician: Sunnie Nielsen, DO [facility]     PCP: Mick Sell, MD  Recommendations for Outpatient Follow-up:  Follow up with PCP Mick Sell, MD in 1-2 weeks Please obtain labs/tests: CBC, CMP in 1-2 weeks Confirm GI follow up  Please follow up on the following pending results: none PCP AND OTHER OUTPATIENT PROVIDERS: SEE BELOW FOR SPECIFIC DISCHARGE INSTRUCTIONS PRINTED FOR PATIENT IN ADDITION TO GENERIC AVS PATIENT INFO     Discharge Instructions     Ambulatory referral to Gastroenterology   Complete by: As directed    What is the reason for referral?: Other Comment - hospital follow up colitis         Discharge Diagnoses: Principal Problem:   Colitis Active Problems:   Tobacco use   GAD (generalized anxiety disorder)   MDD (major depressive disorder), recurrent episode, moderate (HCC)   Seizure-like activity (HCC)   Dehydration, mild   COPD (chronic obstructive pulmonary disease) [facility])       Hospital Course: Hospital course / significant events:   HPI: Nancy Porter is a 53 y.o. female with medical history significant of COPD, pseudoseizures, anxiety/depression, and HLD who presented on 4/1 with abdominal pain.  She reports that she awoke on Sunday (3/30) am with abdominal pain and had a loose BM.  She attributed it to pizza from the night before.  They went to a garden store and about 90 minutes after the first episode she had explosive diarrhea.  Since then, she has had diarrhea about hourly, day and night.  No sleep.  Abdominal pain with severe cramping before episodes.  Minimal PO intake.  No fevers.  No sick contacts.   04/01: Labs with WBC  elevation, CT with colitis. Given IV abx. She doesn't think she will be able to tolerate PO abx and so wants to stay. Admitted to hospitalist service under observation status  04/02-04/03: remains significant abd pain and po intolerance, diarrhea. Continue IV fluids  04/04: reports pain is worse today, diarrhea is improved ubt not resolved. CT abd repeat today shows resolution of colitis, d/w GI, Dr Tobi Bastos and will see in AM if pt worse vs follow outpatient 04/05: feeling improved today, good po intake, pt agreeable for discharge to outpatient f/u     Consultants:  none  Procedures/Surgeries: none      ASSESSMENT & PLAN:   Colitis NO sepsis - only SIRS criteria is leukocytosis (13.9) GI PCR and CDiff neg  advance as able/tolerated  pain tx with oxycodone nausea tx with Zofran Abx tx with augmentin to finish 7 days total course   Imodium + Bentyl pre-meal as needed for cramping/diarrhea GI will see her outpatient   Hypokalemia  Replace as needed Monitor BMP   Mild dehydration, AKI - resolved Ketonuria, mild metabolic acidosis Likely resulting from dehydration associated with GI losses Monitor    COPD/Tobacco dependence Reports no tobacco use since Sunday Will order nicotine patch Cessation encouraged Continue Trelegy, albuterol prn   Anxiety/depression Continue home medications    HLD Not currently on medication Follow outpatient     No concerns based on BMI: Body mass index is 24.75 kg/m.  Underweight - under 18  overweight - 25 to 29 obese - 30 or more Class 1 obesity: BMI of 30.0 to 34 Class 2 obesity: BMI of 35.0 to 39 Class 3 obesity: BMI of 40.0 to 49 Super Morbid Obesity: BMI 50-59 Super-super Morbid Obesity: BMI 60+ Significantly low or high BMI is associated with higher medical risk.  Weight management advised as adjunct to other disease management and risk reduction treatments            Discharge Instructions  Allergies as of  07/20/2023       Reactions   Other    Hormone patches---caused inability to speak/walk        Medication List     STOP taking these medications    promethazine-dextromethorphan 6.25-15 MG/5ML syrup Commonly known as: PROMETHAZINE-DM       TAKE these medications    acetaminophen 325 MG tablet Commonly known as: TYLENOL Take 2 tablets (650 mg total) by mouth every 6 (six) hours as needed for mild pain (pain score 1-3) (or Fever >/= 101).   albuterol 108 (90 Base) MCG/ACT inhaler Commonly known as: VENTOLIN HFA TAKE 2 PUFFS BY MOUTH EVERY 6 HOURS AS NEEDED FOR WHEEZE OR SHORTNESS OF BREATH What changed: See the new instructions.   albuterol (2.5 MG/3ML) 0.083% nebulizer solution Commonly known as: PROVENTIL Inhale 2.5 mg into the lungs every 6 (six) hours as needed for wheezing or shortness of breath. What changed: Another medication with the same name was changed. Make sure you understand how and when to take each.   amoxicillin-clavulanate 875-125 MG tablet Commonly known as: AUGMENTIN Take 1 tablet by mouth 2 (two) times daily for 5 days. Start taking on: July 21, 2023   Botox 200 units injection Generic drug: botulinum toxin Type A Provider to inject 155 units into the muscles of the head and neck every 12 weeks. Discard remainder.   clonazePAM 0.5 MG tablet Commonly known as: KLONOPIN Take 0.5 mg by mouth 2 (two) times daily as needed for anxiety.   dicyclomine 20 MG tablet Commonly known as: BENTYL Take 0.5-1 tablets (10-20 mg total) by mouth 3 (three) times daily as needed (abd spasms).   fluconazole 150 MG tablet Commonly known as: DIFLUCAN Take 1 tablet (150 mg total) by mouth once for 1 dose. Repeat dose in 2-3 days if thrush persists   loperamide 2 MG capsule Commonly known as: IMODIUM Take 2 capsules (4 mg total) by mouth 3 (three) times daily as needed for diarrhea or loose stools.   nortriptyline 50 MG capsule Commonly known as: PAMELOR Take 1  capsule (50 mg total) by mouth at bedtime.   ondansetron 4 MG tablet Commonly known as: ZOFRAN Take 1 tablet (4 mg total) by mouth every 6 (six) hours as needed for nausea.   oxyCODONE 5 MG immediate release tablet Commonly known as: Roxicodone Take 1 tablet (5 mg total) by mouth every 6 (six) hours as needed for severe pain (pain score 7-10).   sertraline 50 MG tablet Commonly known as: ZOLOFT Take 75 mg by mouth daily. 1 tablet daily for 2 weeks then increase to 75mg  daily   topiramate 100 MG tablet Commonly known as: TOPAMAX Take 1 tablet (100 mg total) by mouth 2 (two) times daily.   Trelegy Ellipta 100-62.5-25 MCG/ACT Aepb Generic drug: Fluticasone-Umeclidin-Vilant Inhale 1 puff into the lungs daily.   Ubrelvy 100 MG Tabs Generic drug: Ubrogepant Take 1 tablet (100 mg total) by mouth as needed (take 1 tablet for acute headache, may  repeat in 2 hours if needed, max is 200 mg in 24 hours).         Follow-up Information     Mick Sell, MD. Schedule an appointment as soon as possible for a visit.   Specialty: Infectious Diseases Why: hospital follow up colitis Contact information: [address] [place] [address] [phone]                 Allergies  Allergen Reactions   Other     Hormone patches---caused inability to speak/walk     Subjective: pt reports still some abd discomfort but overall improved, tolerating po intake, inquires about discharge home today if possible. No fever/chills, diarrhea has mostly resolved.    Discharge Exam: BP 112/66 (BP Location: Right Arm)   Pulse 60   Temp 98.7 F (37.1 C) (Oral)   Resp 16   Ht 5\' 7"  (1.702 m)   Wt 71.7 kg   LMP  (LMP Unknown)   SpO2 100%   BMI 24.75 kg/m  General: Pt is alert, awake, not in acute distress Cardiovascular: RRR, S1/S2 +, no rubs, no gallops Respiratory: CTA bilaterally, no wheezing, no rhonchi Abdominal: Soft, NT, ND, bowel sounds + Extremities: no edema,  no cyanosis     The results of significant diagnostics from this hospitalization (including imaging, microbiology, ancillary and laboratory) are listed below for reference.     Microbiology: Recent Results (from the past 240 hours)  C Difficile Quick Screen w PCR reflex     Status: None   Collection Time: 07/16/23 12:35 PM   Specimen: Urine, Clean Catch; Stool  Result Value Ref Range Status   C Diff antigen NEGATIVE NEGATIVE Final   C Diff toxin NEGATIVE NEGATIVE Final   C Diff interpretation No C. difficile detected.  Final    Comment: Performed at [facility], [address] [place], [place] [address]  Gastrointestinal Panel by PCR , Stool     Status: None   Collection Time: 07/16/23 12:35 PM   Specimen: Urine, Clean Catch; Stool  Result Value Ref Range Status   Campylobacter species NOT DETECTED NOT DETECTED Final   Plesimonas shigelloides NOT DETECTED NOT DETECTED Final   Salmonella species NOT DETECTED NOT DETECTED Final   Yersinia enterocolitica NOT DETECTED NOT DETECTED Final   Vibrio species NOT DETECTED NOT DETECTED Final   Vibrio cholerae NOT DETECTED NOT DETECTED Final   Enteroaggregative E coli (EAEC) NOT DETECTED NOT DETECTED Final   Enteropathogenic E coli (EPEC) NOT DETECTED NOT DETECTED Final   Enterotoxigenic E coli (ETEC) NOT DETECTED NOT DETECTED Final   Shiga like toxin producing E coli (STEC) NOT DETECTED NOT DETECTED Final   Shigella/Enteroinvasive E coli (EIEC) NOT DETECTED NOT DETECTED Final   Cryptosporidium NOT DETECTED NOT DETECTED Final   Cyclospora cayetanensis NOT DETECTED NOT DETECTED Final   Entamoeba histolytica NOT DETECTED NOT DETECTED Final   Giardia lamblia NOT DETECTED NOT DETECTED Final   Adenovirus F40/41 NOT DETECTED NOT DETECTED Final   Astrovirus NOT DETECTED NOT DETECTED Final   Norovirus GI/GII NOT DETECTED NOT DETECTED Final   Rotavirus A NOT DETECTED NOT DETECTED Final   Sapovirus (I, II, IV, and V) NOT DETECTED NOT  DETECTED Final    Comment: Performed at [facility], [address] [place], [place] [address]     Labs: BNP (last 3 results) No results for input(s): "BNP" in the last 8760 hours. Basic Metabolic Panel: Recent Labs  Lab 07/16/23 [phone]  07/17/23 0152 07/18/23 0430 07/19/23 0522 07/20/23 0510  NA 138 134* 139 140 140  K 3.3* 3.5 3.3* 3.4* 3.5  CL 109 104 108 110 110  CO2 18* 22 24 23 24   GLUCOSE 119* 84 84 91 103*  BUN 16 14 10 8 7   CREATININE 0.89 0.78 1.05* 0.88 0.96  CALCIUM 9.7 8.4* 8.5* 8.8* 8.8*   Liver Function Tests: Recent Labs  Lab 07/16/23 0949 07/20/23 0510  AST 22 54*  ALT 24 42  ALKPHOS 71 51  BILITOT 0.6 0.5  PROT 7.8 5.8*  ALBUMIN 4.3 3.2*   Recent Labs  Lab 07/16/23 0949  LIPASE 22   No results for input(s): "AMMONIA" in the last 168 hours. CBC: Recent Labs  Lab 07/16/23 0949 07/17/23 0152 07/18/23 0430 07/19/23 0522 07/20/23 0510  WBC 13.9* 10.6* 7.2 7.2 7.5  HGB 15.7* 13.4 12.6 13.1 12.5  HCT 46.6* 39.1 36.9 37.8 36.8  MCV 93.2 92.4 91.6 90.9 90.9  PLT 266 214 198 219 226   Cardiac Enzymes: No results for input(s): "CKTOTAL", "CKMB", "CKMBINDEX", "TROPONINI" in the last 168 hours. BNP: Invalid input(s): "POCBNP" CBG: No results for input(s): "GLUCAP" in the last 168 hours. D-Dimer No results for input(s): "DDIMER" in the last 72 hours. Hgb A1c No results for input(s): "HGBA1C" in the last 72 hours. Lipid Profile No results for input(s): "CHOL", "HDL", "LDLCALC", "TRIG", "CHOLHDL", "LDLDIRECT" in the last 72 hours. Thyroid function studies No results for input(s): "TSH", "T4TOTAL", "T3FREE", "THYROIDAB" in the last 72 hours.  Invalid input(s): "FREET3" Anemia work up No results for input(s): "VITAMINB12", "FOLATE", "FERRITIN", "TIBC", "IRON", "RETICCTPCT" in the last 72 hours. Urinalysis    Component Value Date/Time   COLORURINE YELLOW (A) 07/16/2023 0949   APPEARANCEUR TURBID (A) 07/16/2023 0949   LABSPEC 1.031  (H) 07/16/2023 0949   PHURINE 5.0 07/16/2023 0949   GLUCOSEU NEGATIVE 07/16/2023 0949   HGBUR NEGATIVE 07/16/2023 0949   BILIRUBINUR NEGATIVE 07/16/2023 0949   KETONESUR 5 (A) 07/16/2023 0949   PROTEINUR NEGATIVE 07/16/2023 0949   NITRITE NEGATIVE 07/16/2023 0949   LEUKOCYTESUR TRACE (A) 07/16/2023 0949   Sepsis Labs Recent Labs  Lab 07/17/23 0152 07/18/23 0430 07/19/23 0522 07/20/23 0510  WBC 10.6* 7.2 7.2 7.5   Microbiology Recent Results (from the past 240 hours)  C Difficile Quick Screen w PCR reflex     Status: None   Collection Time: 07/16/23 12:35 PM   Specimen: Urine, Clean Catch; Stool  Result Value Ref Range Status   C Diff antigen NEGATIVE NEGATIVE Final   C Diff toxin NEGATIVE NEGATIVE Final   C Diff interpretation No C. difficile detected.  Final    Comment: Performed at [facility], [address] [place], [place] [address]  Gastrointestinal Panel by PCR , Stool     Status: None   Collection Time: 07/16/23 12:35 PM   Specimen: Urine, Clean Catch; Stool  Result Value Ref Range Status   Campylobacter species NOT DETECTED NOT DETECTED Final   Plesimonas shigelloides NOT DETECTED NOT DETECTED Final   Salmonella species NOT DETECTED NOT DETECTED Final   Yersinia enterocolitica NOT DETECTED NOT DETECTED Final   Vibrio species NOT DETECTED NOT DETECTED Final   Vibrio cholerae NOT DETECTED NOT DETECTED Final   Enteroaggregative E coli (EAEC) NOT DETECTED NOT DETECTED Final   Enteropathogenic E coli (EPEC) NOT DETECTED NOT DETECTED Final   Enterotoxigenic E coli (ETEC) NOT DETECTED NOT DETECTED Final   Shiga like toxin producing E coli (  STEC) NOT DETECTED NOT DETECTED Final   Shigella/Enteroinvasive E coli (EIEC) NOT DETECTED NOT DETECTED Final   Cryptosporidium NOT DETECTED NOT DETECTED Final   Cyclospora cayetanensis NOT DETECTED NOT DETECTED Final   Entamoeba histolytica NOT DETECTED NOT DETECTED Final   Giardia lamblia NOT DETECTED NOT DETECTED  Final   Adenovirus F40/41 NOT DETECTED NOT DETECTED Final   Astrovirus NOT DETECTED NOT DETECTED Final   Norovirus GI/GII NOT DETECTED NOT DETECTED Final   Rotavirus A NOT DETECTED NOT DETECTED Final   Sapovirus (I, II, IV, and V) NOT DETECTED NOT DETECTED Final    Comment: Performed at [facility], [address], [place], [place] [address]   Imaging CT ABDOMEN PELVIS W CONTRAST Result Date: 07/16/2023 CLINICAL DATA:  Evaluate for small bowel obstruction. Lower abdominal pain with diarrhea. EXAM: CT ABDOMEN AND PELVIS WITH CONTRAST TECHNIQUE: Multidetector CT imaging of the abdomen and pelvis was performed using the standard protocol following bolus administration of intravenous contrast. RADIATION DOSE REDUCTION: This exam was performed according to the departmental dose-optimization program which includes automated exposure control, adjustment of the mA and/or kV according to patient size and/or use of iterative reconstruction technique. CONTRAST:  OMNIPAQUE IOHEXOL 300 MG/ML  SOLN COMPARISON:  CT abdomen and pelvis 12/17/2018 FINDINGS: Lower chest: No acute abnormality. Hepatobiliary: No focal liver abnormality is seen. No gallstones, gallbladder wall thickening, or biliary dilatation. Pancreas: Unremarkable. No pancreatic ductal dilatation or surrounding inflammatory changes. Spleen: Normal in size without focal abnormality. Adrenals/Urinary Tract: The bladder is decompressed and not well evaluated. The kidneys and adrenal glands are within normal limits. Stomach/Bowel: There is mild wall thickening of the colon diffusely most significant in the descending colon and transverse colon. There is no surrounding inflammation. The appendix, small bowel and stomach are within normal limits. No acute inflammatory stranding present. Vascular/Lymphatic: No significant vascular findings are present. No enlarged abdominal or pelvic lymph nodes. Reproductive: Status post hysterectomy. No adnexal  masses. Other: No abdominal wall hernia or abnormality. No abdominopelvic ascites. Musculoskeletal: No acute osseous findings. Bilateral breast implants are present. IMPRESSION: 1. Mild wall thickening of the colon diffusely most significant in the descending colon and transverse colon, worrisome for infectious/inflammatory colitis. 2. No evidence for bowel obstruction. Electronically Signed   By: Darliss Cheney M.D.   On: 07/16/2023 15:40      Time coordinating discharge: over 30 minutes  SIGNED:  Sunnie Nielsen DO [facility]

## 2023-07-22 ENCOUNTER — Telehealth: Payer: Self-pay

## 2023-07-22 NOTE — Telephone Encounter (Signed)
 The patient called to request an office visit with Dr. Tobi Bastos, stating she was referred by the hospital. I informed her that we currently do not have available appointments for new patients. I offered to add her to our call-back list and assured her that we would contact her as soon as an appointment becomes available. Additionally, I informed her that we are in the process of getting new doctors. The patient expressed urgency, mentioning that she stayed for four days and needs to be seen soon. I advised her to contact [facility] to inquire about scheduling an appointment sooner.

## 2023-07-23 ENCOUNTER — Other Ambulatory Visit (HOSPITAL_COMMUNITY): Payer: Self-pay

## 2023-07-23 NOTE — Telephone Encounter (Signed)
 Pharmacy Patient Advocate Encounter  PA Cancelled. Stuck in system.

## 2023-08-01 ENCOUNTER — Other Ambulatory Visit: Payer: Self-pay | Admitting: Infectious Diseases

## 2023-08-01 DIAGNOSIS — N6489 Other specified disorders of breast: Secondary | ICD-10-CM

## 2023-08-02 ENCOUNTER — Telehealth: Payer: Self-pay

## 2023-08-02 ENCOUNTER — Other Ambulatory Visit (HOSPITAL_COMMUNITY): Payer: Self-pay

## 2023-08-02 NOTE — Telephone Encounter (Signed)
 Pharmacy Patient Advocate Encounter   Received notification from Fax that prior authorization for Ubrelvy  100MG  tablets is required/requested.   Insurance verification completed.   The patient is insured through [facility] .   Per test claim: PA required; PA submitted to above mentioned insurance via CoverMyMeds Key/confirmation #/EOC [id number] Status is pending

## 2023-08-02 NOTE — Telephone Encounter (Signed)
 Pharmacy Patient Advocate Encounter  Received notification from [facility] that Prior Authorization for Ubrelvy  100MG  tablets has been DENIED.  Full denial letter will be uploaded to the media tab. See denial reason below.   PA #/Case ID/Reference #: PA Case ID #: [id number]

## 2023-08-02 NOTE — Telephone Encounter (Signed)
 Pharmacy Patient Advocate Encounter  Received notification from [facility] that Prior Authorization for Aimovig  140MG /ML auto-injectors has been APPROVED from 08/02/2023 to 08/01/2024. Ran test claim, Copay is $4.00. This test claim was processed through [facility]- copay amounts may vary at other pharmacies due to pharmacy/plan contracts, or as the patient moves through the different stages of their insurance plan.   PA #/Case ID/Reference #: PA Case ID #: [id number]  Key: [id number]

## 2023-08-16 ENCOUNTER — Encounter

## 2023-08-19 NOTE — Telephone Encounter (Signed)
 Approval letter is in the chart under the media tab-it could look confusing because it says 1 billable unit but it is approved for 1 billable unit which is 1ml per month starting 08/02/2023-08/01/2024    Hope that helps the PT understand better.

## 2023-08-23 ENCOUNTER — Encounter (HOSPITAL_COMMUNITY): Payer: Self-pay

## 2023-08-29 ENCOUNTER — Ambulatory Visit

## 2023-08-29 DIAGNOSIS — K31A19 Gastric intestinal metaplasia without dysplasia, unspecified site: Secondary | ICD-10-CM | POA: Diagnosis not present

## 2023-08-29 DIAGNOSIS — K2100 Gastro-esophageal reflux disease with esophagitis, without bleeding: Secondary | ICD-10-CM | POA: Diagnosis not present

## 2023-08-29 DIAGNOSIS — Z8719 Personal history of other diseases of the digestive system: Secondary | ICD-10-CM | POA: Diagnosis not present

## 2023-08-29 DIAGNOSIS — R1032 Left lower quadrant pain: Secondary | ICD-10-CM | POA: Diagnosis not present

## 2023-08-29 DIAGNOSIS — R1031 Right lower quadrant pain: Secondary | ICD-10-CM | POA: Diagnosis not present

## 2023-08-29 DIAGNOSIS — R194 Change in bowel habit: Secondary | ICD-10-CM | POA: Diagnosis not present

## 2023-08-30 ENCOUNTER — Other Ambulatory Visit

## 2023-08-30 ENCOUNTER — Encounter

## 2023-09-10 ENCOUNTER — Other Ambulatory Visit: Payer: Self-pay | Admitting: Neurology

## 2023-09-10 ENCOUNTER — Other Ambulatory Visit: Payer: Self-pay

## 2023-09-10 NOTE — Telephone Encounter (Signed)
 Routing to provider due to interaction:

## 2023-09-12 ENCOUNTER — Other Ambulatory Visit: Payer: Self-pay

## 2023-09-12 NOTE — Progress Notes (Signed)
 Specialty Pharmacy Refill Coordination Note  SCOTTI MOTTER is a 53 y.o. female contacted today regarding refills of specialty medication(s) OnabotulinumtoxinA  (Botox )   Patient requested Courier to Provider Office   Delivery date: 09/16/23   Verified address: [facility] [address] [place] [address]   Medication will be filled on 09/13/23.

## 2023-09-13 ENCOUNTER — Other Ambulatory Visit: Payer: Self-pay

## 2023-09-13 ENCOUNTER — Other Ambulatory Visit (HOSPITAL_COMMUNITY): Payer: Self-pay

## 2023-09-13 ENCOUNTER — Telehealth: Payer: Self-pay

## 2023-09-13 NOTE — Telephone Encounter (Signed)
 Nancy Porter

## 2023-09-13 NOTE — Progress Notes (Signed)
 Insurance terminated. No eligibility results. Routed to [place].

## 2023-09-16 ENCOUNTER — Telehealth: Payer: Self-pay | Admitting: Neurology

## 2023-09-16 ENCOUNTER — Other Ambulatory Visit: Payer: Self-pay

## 2023-09-16 NOTE — Telephone Encounter (Signed)
 Addressed in Botox  encounter from today, 09/16/2023.

## 2023-09-16 NOTE — Telephone Encounter (Signed)
 Information has already been entered by the BOTOX  Coordinator.

## 2023-09-16 NOTE — Telephone Encounter (Signed)
 Pt's insurance is now Medicare A+B as primary and [place] Medicaid as secondary. She will be B/B with no auth required moving forward.

## 2023-09-18 ENCOUNTER — Other Ambulatory Visit: Payer: Self-pay

## 2023-09-18 NOTE — Progress Notes (Signed)
 Disenrolled; Per Gaynel Keel. note from 09/16/23  Pt's insurance is now Medicare A+B as primary and [place] Medicaid as secondary. She will be B/B.

## 2023-09-19 ENCOUNTER — Ambulatory Visit: Admitting: Neurology

## 2023-09-19 ENCOUNTER — Encounter: Payer: Self-pay | Admitting: Neurology

## 2023-09-19 VITALS — BP 98/72 | HR 73 | Ht 67.0 in | Wt 160.0 lb

## 2023-09-19 DIAGNOSIS — G43711 Chronic migraine without aura, intractable, with status migrainosus: Secondary | ICD-10-CM

## 2023-09-19 DIAGNOSIS — R519 Headache, unspecified: Secondary | ICD-10-CM

## 2023-09-19 DIAGNOSIS — G43709 Chronic migraine without aura, not intractable, without status migrainosus: Secondary | ICD-10-CM | POA: Diagnosis not present

## 2023-09-19 MED ORDER — ONABOTULINUMTOXINA 200 UNITS IJ SOLR: 155.0000 [IU] | Freq: Once | INTRAMUSCULAR | Status: DC

## 2023-09-19 MED ORDER — UBRELVY 100 MG PO TABS: 100.0000 mg | ORAL_TABLET | ORAL | 11 refills | Status: DC | PRN

## 2023-09-19 MED ORDER — ONABOTULINUMTOXINA 200 UNITS IJ SOLR
155.0000 [IU] | Freq: Once | INTRAMUSCULAR | Status: AC
  Administered 2023-09-19: 165 [IU] via INTRAMUSCULAR

## 2023-09-19 NOTE — Progress Notes (Signed)
 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

## 2023-09-19 NOTE — Progress Notes (Signed)
   BOTOX  PROCEDURE NOTE FOR MIGRAINE HEADACHE  HISTORY: Nancy Porter is here for Botox . Last was 06/25/23 this will be her sixth Botox  injection.  With Botox  + Aimovig  had about 3 migraines weekly, when it wears off a few weeks before near daily migraine. Combination of Botox  + Aimovig  decreased the frequency and intensity of migraines. Ubrelvy  works great!! Insurance is paying for Aimovig . Needs refill on Ubrelvy . Going to EMU soon.  Description of procedure:  The patient was placed in a sitting position. The standard protocol was used for Botox  as follows, with 5 units of Botox  injected at each site:  -Procerus muscle, midline injection  -Corrugator muscle, bilateral injection  -Frontalis muscle, bilateral injection, with 2 sites each side, medial injection was performed in the upper one third of the frontalis muscle, in the region vertical from the medial inferior edge of the superior orbital rim. The lateral injection was again in the upper one third of the forehead vertically above the lateral limbus of the cornea, 1.5 cm lateral to the medial injection site.  -Temporalis muscle injection, 4 sites, bilaterally. The first injection was 3 cm above the tragus of the ear, second injection site was 1.5 cm to 3 cm up from the first injection site in line with the tragus of the ear. The third injection site was 1.5-3 cm forward between the first 2 injection sites. The fourth injection site was 1.5 cm posterior to the second injection site.  -Occipitalis muscle injection, 3 sites, bilaterally. The first injection was done one half way between the occipital protuberance and the tip of the mastoid process behind the ear. The second injection site was done lateral and superior to the first, 1 fingerbreadth from the first injection. The third injection site was 1 fingerbreadth superiorly and medially from the first injection site.  -Cervical paraspinal muscle injection, 2 sites, bilateral, the first  injection site was 1 cm from the midline of the cervical spine, 3 cm inferior to the lower border of the occipital protuberance. The second injection site was 1.5 cm superiorly and laterally to the first injection site.  -Trapezius muscle injection was performed at 3 sites, bilaterally. The first injection site was in the upper trapezius muscle halfway between the inflection point of the neck, and the acromion. The second injection site was one half way between the acromion and the first injection site. The third injection was done between the first injection site and the inflection point of the neck.  5 units bilateral masseters.  A 200 unit bottle of Botox  was used, 165 units were injected, the rest of the Botox  was wasted. The patient tolerated the procedure well, there were no complications of the above procedure.  Botox  NDC 4098-1191-47 Lot number [id number] Expiration date 02/14/2026 BB

## 2023-10-08 ENCOUNTER — Other Ambulatory Visit: Payer: Self-pay | Admitting: Neurology

## 2023-10-09 ENCOUNTER — Telehealth: Payer: Self-pay

## 2023-10-09 ENCOUNTER — Encounter: Payer: Self-pay | Admitting: Neurology

## 2023-10-09 ENCOUNTER — Other Ambulatory Visit (HOSPITAL_COMMUNITY): Payer: Self-pay

## 2023-10-09 NOTE — Telephone Encounter (Signed)
 Pharmacy Patient Advocate Encounter   Received notification from RX Request Messages that prior authorization for Ubrelvy  100mg  is required/requested.   Insurance verification completed.   The patient is insured through [facility] MEDICAID .   Per test claim: PA required; PA submitted to above mentioned insurance via [facility] Tracks Key/confirmation #/EOC [id number] W Status is pending

## 2023-10-09 NOTE — Telephone Encounter (Signed)
 Pharmacy Patient Advocate Encounter   Received notification from RX Request Messages that prior authorization for Aimovig  140mg  is required/requested.   Insurance verification completed.   The patient is insured through [facility] MEDICAID .   Per test claim: PA required; PA submitted to above mentioned insurance via [facility] Tracks Key/confirmation #/EOC [id number] W Status is pending

## 2023-10-10 NOTE — Telephone Encounter (Signed)
 Submitted benefit verification [id number]

## 2023-10-16 ENCOUNTER — Telehealth: Payer: Self-pay | Admitting: Neurology

## 2023-10-16 ENCOUNTER — Other Ambulatory Visit (HOSPITAL_COMMUNITY): Payer: Self-pay

## 2023-10-16 ENCOUNTER — Telehealth: Payer: Medicaid Other | Admitting: Neurology

## 2023-10-16 DIAGNOSIS — G43709 Chronic migraine without aura, not intractable, without status migrainosus: Secondary | ICD-10-CM

## 2023-10-16 DIAGNOSIS — F445 Conversion disorder with seizures or convulsions: Secondary | ICD-10-CM

## 2023-10-16 DIAGNOSIS — G43009 Migraine without aura, not intractable, without status migrainosus: Secondary | ICD-10-CM

## 2023-10-16 MED ORDER — TOPIRAMATE 100 MG PO TABS: 100.0000 mg | ORAL_TABLET | Freq: Two times a day (BID) | ORAL | 3 refills | Status: AC

## 2023-10-16 MED ORDER — NORTRIPTYLINE HCL 50 MG PO CAPS: 50.0000 mg | ORAL_CAPSULE | Freq: Every day | ORAL | 3 refills | Status: AC

## 2023-10-16 MED ORDER — ERENUMAB-AOOE 140 MG/ML ~~LOC~~ SOAJ
140.0000 mg | Freq: Once | SUBCUTANEOUS | Status: AC
Start: 2023-10-16 — End: ?

## 2023-10-16 MED ORDER — UBRELVY 100 MG PO TABS: 100.0000 mg | ORAL_TABLET | ORAL | 11 refills | Status: AC | PRN

## 2023-10-16 NOTE — Telephone Encounter (Signed)
 Pharmacy Patient Advocate Encounter  Received notification from [place] [employer] that Prior Authorization for Ubrelvy  has been APPROVED from 10/09/2023 to 10/08/2024   PA #/Case ID/Reference #: [id number]

## 2023-10-16 NOTE — Progress Notes (Signed)
 [facility]    Provider:  Dr Porter Referring Provider: Epifanio Alm SQUIBB, MD Primary Care Physician:  Epifanio Alm SQUIBB, MD ,  CC:  Migraine   Virtual Visit via Video Note  I connected with Nancy Porter on 10/16/23 at  9:30 AM EDT by a video enabled telemedicine application and verified that I am speaking with the correct person using two identifiers.  Location: Patient: Home Provider: office   I discussed the limitations of evaluation and management by telemedicine and the availability of in person appointments. The patient expressed understanding and agreed to proceed.     Follow Up Instructions:    I discussed the assessment and treatment plan with the patient. The patient was provided an opportunity to ask questions and all were answered. The patient agreed with the plan and demonstrated an understanding of the instructions.   The patient was advised to call back or seek an in-person evaluation if the symptoms worsen or if the condition fails to improve as anticipated.  I provided 35 minutes of non-face-to-face time during this encounter.   Nancy KATHEE Ines, MD   10/16/2023: She has been diagnosed with Functional Neurologic Disorder and PNES. She is going to be admitted to the EMU. She has canceled EMu before and now is canceling again told her she needs to reschedule we cannot help her if she does follow medical advice. She has been under a tremendous amount of stress. Her mother is ill.   Patient complains of symptoms per HPI as well as the following symptoms: per hpi . Pertinent negatives and positives per HPI. All others negative   04/03/2023: She has been diagnosed with Functional Neurologic Disorder and PNES. She starts feeling a spasm on the right her face will spasm. Within 5 minutes she is full blown she locks up, her whole face is distorted and she doesn't remember a thing. She is extremely fatigued after and takes a few days. The  last time she turned, that is all she remembers, husband provides information she heard the fall is so hard and he looked over and she was on the ground, I think she needs EMU with the symptoms above sound epileptogenic, she has had heart monitor and long-term eeg 72 hours but did not have an episode during that. We will refer to Prentice Sar for evaluation and EMU monitoring oer his clinical judgement.   Aimovig  and emgality have been great. But she still has headaches. No significant snoring. But husband says she wakes up gasping for breath like she can't breath, she is choking. She wakes up with morning headache every day. She is excessively tired by 1pm she wants a nap. Discussed sleep apnea.   Send you to Prentice Sar, MD [facility] epilepsy specialist and EMU Unit Continue Aimovig  and Botox  Need a sleep evaluation If workup with epilepsy not helpful I recommend Lamictal(lamotrigine) for mood and anti-epilepsy please discuss with psychiatry but don't start until after the EMU. I would slowly increase to 200mg  a day and she can cpntact us  to start or discuss with psychiatry (Lamictal is used for mood, seizures and migraines)  Patient complains of symptoms per HPI as well as the following symptoms: migraine . Pertinent negatives and positives per HPI. All others negative   04/10/2022: Her mother came to live with her.She was being mistreated. Mother went to rehab and mother went to nursing home and then to patient's house. 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 She has Medicaid now.  And migraines have changed.  She has a new droopy right eyelid, she is having new double vision side by side but can be up and down, on command she cannot abduct or adduct her eyes but as I move around the room her extraocular movements are intact. She is having sharp, shooting pains into the right or left temple, and her whole head of her face and head numb and so sensitive she can't even touch it, almost bruised and tender,  also throughout her scalp. No unilateral lacrimation or rhinorhea. Pounding, pulsating, throbbing, light sensitivity, husband here and provides much information. Still having PNES.  She is post menopausal. At least 12 migraine days a month, > 15 headache days a month, moderate to severe, last 24-72 hours, no aura, no medication overuse, > 6 months. No other focal neurologic deficits, associated symptoms, inciting events or modifiable factors.  09/14/2019: MRA HEAD WITHOUT CONTRAST   TECHNIQUE: Multiplanar, multiecho pulse sequences of the brain and surrounding structures were obtained without intravenous contrast. Angiographic images of the head were obtained using MRA technique without contrast.   COMPARISON:  CT head 09/14/2019   FINDINGS: MRI HEAD FINDINGS   Brain: No acute infarction, hemorrhage, hydrocephalus, extra-axial collection or mass lesion. No significant chronic ischemic change.   Vascular: Normal arterial flow voids   Skull and upper cervical spine: No focal skeletal lesion.   Sinuses/Orbits: Mild mucosal edema paranasal sinuses. Normal orbit bilaterally   Other: None   MRA HEAD FINDINGS   Internal carotid artery widely patent bilaterally. Anterior and middle cerebral arteries are normal and widely patent bilaterally   Both vertebral arteries are patent without stenosis. PICA is patent bilaterally. AICA, superior cerebellar, posterior cerebral arteries widely patent without stenosis. Basilar widely patent.   Negative for cerebral aneurysm.   IMPRESSION: Negative MRI head   Negative MRA head     Interval history 06/20/2021: Here for follow up of migraines. 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 For her migraines she did excellent on botox , but she decided to change and restarted Aimovig  and Continued ubrelvy  at last appointment. Since then she does  not have insurance. Aimovig  helped her. She is on topamax .she is also on nortriptyline . Sent her the [employer] application for aimovig . She can't het the ubrelvy  either. She does not get a lot of nausea. Triptans cause rebound headaches and has tried amerge which has a longer half life. She has tried multiple triptans: naratriptan , rizatriptan, it doesn't work she gets rebound. She spoke about the non-epileptic events, I again told her that she needs therapy. She is still having active seizures and we spoke about [place] law that she cannot drive for 6 months until she is event free, her license has expired, I told her to discuss with DMV whether she can get her license or not. She is triggered by overstimulation per her daughter like anxiety attack, happens when multiple things are ongoing.  Patient complains of symptoms per HPI as well as the following symptoms: PNES, afib . Pertinent negatives and positives per HPI. All others negative   Interval histroy 07/18/2020: She is not seeing Dr. Zusman anymore for conversion disorder. She did not have connection with him. 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 Her migraines are not well controlled. We can go back to Aimovig , she had no  side effects. Ubrelvy  helps. She felt she was going round and round with zusman. Daughter says if they get into a fight she will have an episode 9daughter is here and provides most information) and happens in the setting of stress. She cannot drive for 6 months after having loss of consciousness.     Interval history September 24, 2019: Patient is here for a new issue/request: seizure-like activity, she was admitted to [facility] on May 31 and discharged on June 2 of this year, I reviewed notes from her admission: She presented with a 1 month history of intermittent headaches, seizure-like spells, subtle right sided weakness and  subsequently admitted to the hospitalist service for further evaluation, right facial droop, EEG showed no seizure activity, MRI of the brain and MRA of the head without any acute etiologies, UDS positive for benzos and THC.  Diagnosed with pseudoseizures, video EEG was performed to events were recorded without concomitant EEG changes, no seizure or epileptiform discharges were seen throughout the recording.  No obvious headaches.  Psychiatry recommended discontinuation of Neurontin  by Dr. Versa opinion was that patient was on such a low dose it was okay to discontinue.  Zoloft  was increased for her anxiety.  She was very tearful and stressful and upset at the pseudoseizure diagnosis and wanting to know why she still had right-sided numbness weakness.  She is here with her husband. She was unhappy about her admission and her psychiatric referral, she is having episodes and feels like she was labeled as psychiatric. She was having 7 episodes every other hour 7-10 minutes long, husband provides much information, has been ongoing for months but sporadic and she went to the hospital because they got worse over memorial day weekend, she started shaking. She knows when it is coming, she is about to have one, the cheek will start twiching and she would lay down and both arms shaking, when she calmed down she feels better and wake up and be exhausted then complain about a headaches. She has a video, her head ir extended, she is hyperventilating, arms shaking, lasts 10 minutes. EEG caught several of the events, no correlate. She is better on the Klonopin .   Reviewed MRI of the brain and MRA of the head images agree with radiology reports below:  MRI HEAD WITHOUT CONTRAST   MRA HEAD WITHOUT CONTRAST   TECHNIQUE: Multiplanar, multiecho pulse sequences of the brain and surrounding structures were obtained without intravenous contrast. Angiographic images of the head were obtained using MRA technique  without contrast.   COMPARISON:  CT head 09/14/2019   FINDINGS: MRI HEAD FINDINGS   Brain: No acute infarction, hemorrhage, hydrocephalus, extra-axial collection or mass lesion. No significant chronic ischemic change.   Vascular: Normal arterial flow voids   Skull and upper cervical spine: No focal skeletal lesion.   Sinuses/Orbits: Mild mucosal edema paranasal sinuses. Normal orbit bilaterally   Other: None   MRA HEAD FINDINGS   Internal carotid artery widely patent bilaterally. Anterior and middle cerebral arteries are normal and widely patent bilaterally   Both vertebral arteries are patent without stenosis. PICA is patent bilaterally. AICA, superior cerebellar, posterior cerebral arteries widely patent without stenosis. Basilar widely patent.   Negative for cerebral aneurysm.   IMPRESSION: Negative MRI head   Negative MRA head  Bmp, magnesium, cbc, unremarkable  B12 291, B1 normal, UDS +benzo and THC  Patient complains of symptoms per HPI as well as the following symptoms: stress . Pertinent negatives and positives per  HPI. All others negative    Interval history 02/05/2019: Doing excellent on botox . - Doing excellent on Botox  alone. NOT ON CGRP. Continue botox , > 75% improvement in frequency and severity of migraines.  Interval History 08/21/2018: Patient reports that the cgrp has not worked. Sine she stopped the botox  and remained on the Aimovig  The migraines have returned since she stopped the botox . At this time she is not takng the Aimovig  and would like to go back to the botox . DIscussed this, will try and get both botox  and cgrp approved but if we cannot then she will stop the cgrp in favor of botox    HPI 12/30/2017:  Nancy Porter is a 53 y.o. female here as requested by Dr. Epifanio for migraines. PMHx anxiety, suicide attempt, tobacco use, ADHD. She has had a continuousheadache since July 27th. She has Daily headaches and >15 migraine days a month for  over a year but worsening to daily migraines since July 27th. The headache is unilateral, but can be bilateral temporal and periorbital, throbbing/pulsating/pounding, +photo/phonophobia, +nasuea, +vomiting, movement makes it worse, no aura, she takes abortive medication daily, she has no medication overuse, unknown inciting events, she has been to other neurologists, she has been to [facility], she has been to the emergency room and has been admitted. Severe pain. Migraine lasts 24-72 hours. She denies any OTC medication use since July. She even stopped the Benadryl . She describes lots of pressure. No aura. No medication overuse, no aura. She has been to the eye doctor, she has had imaging that is normal. The episodes are severe. She has associated tingling and some dizziness. She is worried and anxious. Husband says she is under stress, worried, working late, constantly going without a break. She ha numbness on the right nd now she is having them on the left. She will zone out blank stare.   meds tried: butalbital, imitrex, advil, tylenol , excedrin, topamax , benadryl , ativan , DHE, compazine , toradol , zomig, nortriptyline , prednisone , nortriptyline , trazodone   Reviewed notes, labs and imaging from outside physicians, which showed:  Personally reviewed images on CD patient brought MRI brain and MRA head appears normal  Reviewed notes from headache clinic [facility] 12/2017. Migraine for 19 days, intense pressure, light sensitivity, has been to the ED, has seen neurosurgery, taking excedrin and imitrex, daily headache in the forehead and temporal regions, wors ein the afternoons, headaches for 20 years. Neuro exam was normal. Medication overuse was disussed. Also seen prior 8/16 for 3 weeks of throbbing headaches. She is going insane, headaches 3-4 days a week.   11/20/2017: BUn 11, creatinine 0.59  Patient complains of symptoms per HPI as well as the following symptoms: PNES . Pertinent negatives and  positives per HPI. All others negative    Social History   Socioeconomic History   Marital status: Married    Spouse name: Deward   Number of children: 2   Years of education: some college   Highest education level: Some college, no degree  Occupational History   Occupation: Scientist, physiological: [employer]  Tobacco Use   Smoking status: Every Day    Current packs/day: 0.00    Average packs/day: 0.5 packs/day for 35.0 years (17.5 ttl pk-yrs)    Types: Cigarettes    Start date: 01/08/1984    Last attempt to quit: 01/08/2019    Years since quitting: 4.7   Smokeless tobacco: Never   Tobacco comments:    now 1/2 ppd   Vaping  Use   Vaping status: Never Used  Substance and Sexual Activity   Alcohol use: Not Currently    Comment: occ maybe 1-2 a month   Drug use: Yes    Frequency: 7.0 times per week    Types: Marijuana    Comment: cocaine in the past   Sexual activity: Not on file  Other Topics Concern   Not on file  Social History Narrative   Patient is left-handed. She lives with her husband and daughter in a 1 story house.  She has not been exercising in the last year.    1 cups coffee daily.   Social Drivers of Corporate investment banker Strain: Low Risk  (08/14/2023)   Received from [facility]   Overall Financial Resource Strain (CARDIA)    Difficulty of Paying Living Expenses: Not very hard  Food Insecurity: No Food Insecurity (08/14/2023)   Received from [facility]   Hunger Vital Sign    Within the past 12 months, you worried that your food would run out before you got the money to buy more.: Never true    Within the past 12 months, the food you bought just didn't last and you didn't have money to get more.: Never true  Transportation Needs: No Transportation Needs (08/14/2023)   Received from [facility] - Transportation    In the past 12 months, has lack of transportation kept  you from medical appointments or from getting medications?: No    Lack of Transportation (Non-Medical): No  Physical Activity: Not on file  Stress: Not on file  Social Connections: Not on file  Intimate Partner Violence: Not At Risk (07/16/2023)   Humiliation, Afraid, Rape, and Kick questionnaire    Fear of Current or Ex-Partner: No    Emotionally Abused: No    Physically Abused: No    Sexually Abused: No    Family History  Problem Relation Age of Onset   Glaucoma Mother    Heart disease Mother    Heart Problems Mother    Arrhythmia Mother        fib   Deep vein thrombosis Father    Heart Problems Father    Heart disease Father    Migraines Sister    Diabetes Paternal Uncle        adult onset   CAD Maternal Grandfather    Stroke Maternal Grandmother    Aneurysm Maternal Grandmother    Heart disease Maternal Grandmother    Drug abuse Son    Seizures Neg Hx     Past Medical History:  Diagnosis Date   Anxiety    Arrhythmia    COPD (chronic obstructive pulmonary disease) (HCC)    Depression    Deviated septum    Hx of migraines    Hx of suicide attempt 2010   overdose on Trileptal, has been on Seroquel in the past   Hyperlipidemia    Tobacco use     Past Surgical History:  Procedure Laterality Date   ABDOMINAL HYSTERECTOMY     AUGMENTATION MAMMAPLASTY Bilateral    implants 30 years ago-saline   BREAST BIOPSY Right 08/19/2019   Affirm bx-coil clip-path pending   BREAST ENHANCEMENT SURGERY     CESAREAN SECTION     x 2   LEFT HEART CATH AND CORONARY ANGIOGRAPHY N/A 09/08/2020   Procedure: LEFT HEART CATH AND CORONARY ANGIOGRAPHY;  Surgeon: Hester Wolm PARAS, MD;  Location: [facility] INVASIVE  CV LAB;  Service: Cardiovascular;  Laterality: N/A;   SHOULDER SURGERY Left    impingement   TRANSUMBILICAL AUGMENTATION MAMMAPLASTY      Current Outpatient Medications  Medication Sig Dispense Refill   acetaminophen  (TYLENOL ) 325 MG tablet Take 2 tablets (650 mg total) by  mouth every 6 (six) hours as needed for mild pain (pain score 1-3) (or Fever >/= 101).     albuterol  (PROVENTIL ) (2.5 MG/3ML) 0.083% nebulizer solution Inhale 2.5 mg into the lungs every 6 (six) hours as needed for wheezing or shortness of breath.     albuterol  (VENTOLIN  HFA) 108 (90 Base) MCG/ACT inhaler TAKE 2 PUFFS BY MOUTH EVERY 6 HOURS AS NEEDED FOR WHEEZE OR SHORTNESS OF BREATH (Patient taking differently: Inhale 2 puffs into the lungs See admin instructions. Inhale 2 puffs (scheduled)  twice daily & use  every 6 hours as needed for wheezing/shortness of breath.) 8.5 each 2   botulinum toxin Type A  (BOTOX ) 200 units injection Provider to inject 155 units into the muscles of the head and neck every 12 weeks. Discard remainder. 1 each 3   clonazePAM  (KLONOPIN ) 0.5 MG tablet Take 0.5 mg by mouth 2 (two) times daily as needed for anxiety.     dicyclomine  (BENTYL ) 20 MG tablet Take 0.5-1 tablets (10-20 mg total) by mouth 3 (three) times daily as needed (abd spasms). 60 tablet 0   loperamide  (IMODIUM ) 2 MG capsule Take 2 capsules (4 mg total) by mouth 3 (three) times daily as needed for diarrhea or loose stools. 30 capsule 0   nortriptyline  (PAMELOR ) 50 MG capsule Take 1 capsule (50 mg total) by mouth at bedtime. 90 capsule 3   ondansetron  (ZOFRAN ) 4 MG tablet Take 1 tablet (4 mg total) by mouth every 6 (six) hours as needed for nausea. 20 tablet 0   oxyCODONE  (ROXICODONE ) 5 MG immediate release tablet Take 1 tablet (5 mg total) by mouth every 6 (six) hours as needed for severe pain (pain score 7-10). (Patient not taking: Reported on 09/19/2023) 15 tablet 0   sertraline  (ZOLOFT ) 50 MG tablet Take 75 mg by mouth daily. 1 tablet daily for 2 weeks then increase to 75mg  daily     topiramate  (TOPAMAX ) 100 MG tablet Take 1 tablet (100 mg total) by mouth 2 (two) times daily. 180 tablet 3   TRELEGY ELLIPTA 100-62.5-25 MCG/ACT AEPB Inhale 1 puff into the lungs daily.     Ubrogepant  (UBRELVY ) 100 MG TABS Take 1  tablet (100 mg total) by mouth as needed (take 1 tablet for acute headache, may repeat in 2 hours if needed, max is 200 mg in 24 hours). 16 tablet 11   Current Facility-Administered Medications  Medication Dose Route Frequency Provider Last Rate Last Admin   Erenumab -aooe SOAJ 140 mg  140 mg Subcutaneous Once         Allergies as of 10/16/2023 - Review Complete 09/19/2023  Allergen Reaction Noted   Other  08/31/2020     Physical exam: Exam: Gen: NAD, conversant      CV: No palpitations or chest pain or SOB. VS: Breathing at a normal rate. Weight appears within normal limits. Not febrile. Eyes: Conjunctivae clear without exudates or hemorrhage  Neuro: Detailed Neurologic Exam  Speech:    Speech is normal; fluent and spontaneous with normal comprehension.  Cognition:    The patient is oriented to person, place, and time;     recent and remote memory intact;     language fluent;  normal attention, concentration, fund of knowledge Cranial Nerves:    The pupils are equal, round, and reactive to light. Visual fields are full Extraocular movements are intact.  The face is symmetric with normal sensation. The palate elevates in the midline. Hearing intact. Voice is normal. Shoulder shrug is normal. The tongue has normal motion without fasciculations.   Coordination: normal  Gait:    No abnormalities noted or reported  Motor Observation:   no involuntary movements noted. Tone:    Appears normal  Posture:    Posture is normal. normal erect    Strength:    Strength is anti-gravity and symmetric in the upper and lower limbs.      Sensation: intact to LT, no reports of numbness or tingling or paresthesias          Assessment/Plan:  53 year old with chronic intractable headaches and Non-epileptic events.   She has been diagnosed with Functional Neurologic Disorder and PNES. She starts feeling a spasm on the right her face will spasm. Within 5 minutes she is full  blown she locks up, her whole face is distorted and she doesn't remember a thing. She is extremely fatigued after and takes a few days. 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 We referred to Prentice Sar for evaluation and EMU monitoring and she states she has to cancel it again. She has canceled EMu before and now is canceling again told her she needs to reschedule we cannot help her if she does follow medical advice. She has been under a tremendous amount of stress. Her mother is ill.   Aimovig  and botox  have been great. But she still has headaches. No significant snoring. But husband says she wakes up gasping for breath like she can't breath, she is choking. She wakes up with morning headache every day. She is excessively tired by 1pm she wants a nap. ESS 15. Referral to Dr. Chalice for sleep apnea. Went to sleep lab to get her appt asap.   Encourage EMU admission with Dr. Prentice Sar, MD [facility] epilepsy specialist and EMU Unit, she has canceled, follow up there Continue Aimovig  and Botox  Referred in the past for sleep evaluation, home sleep test was negative for sleep-related disorder If workup with epilepsy not helpful I recommend Lamictal(lamotrigine) for mood and anti-epilepsy please discuss with psychiatry but don't start until after the EMU. 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 Corina and she also saw Nancy Blackwater. For Migraines continue nortriptyline , topiramate , botox , aimovig . At baseline she has chronic migraines >8 migraine days a month and > 15 total headache daysa  onth but with treatment she has 4 migraine days a month and < 8 total headache days a month use ubrelvy  prn.   Meds ordered this encounter  Medications   nortriptyline  (PAMELOR ) 50 MG  capsule    Sig: Take 1 capsule (50 mg total) by mouth at bedtime.    Dispense:  90 capsule    Refill:  3   topiramate  (TOPAMAX ) 100 MG tablet    Sig: Take 1 tablet (100 mg total) by mouth 2 (two) times daily.    Dispense:  180 tablet    Refill:  3   Ubrogepant  (UBRELVY ) 100 MG TABS    Sig: Take 1 tablet (100 mg total) by mouth as needed (take 1 tablet for acute headache, may repeat in 2 hours if needed, max is 200 mg in 24 hours).    Dispense:  16 tablet    Refill:  11   Erenumab -aooe SOAJ 140 mg    PRIOR  I had a very long talk with patient and husband.  Initially they were quite upset because they were told this is all in your head per report, however I did talk to them about conversion disorder, that emotional problems can definitely present themselves physically, that it is not all in her head as she did have these episodes; the episodes are real, the cause is not from an abnormal electrical activity in the brain but I do think it is from stress, anxiety, worrying, she does see a psychiatrist, I recommended seeing a therapist.  I also gave her a book about nonepileptic and psychogenic pseudoseizures, I think that she understood and hopefully she will read the book and tell me what she thinks.  I did tell her however that we could do further testing, but she has not had any episodes since being on the Klonopin , I did review a video that she had which was not consistent with seizures (head extension, hyperventilating, shaking of limbs variably), not consistent with physiologic neurologic  etiology.  I tried to reassure her that there are many people who have nonepileptic, psychogenic. Will check MMA since B12 was 291.    To prevent or relieve headaches, try the following: Cool Compress. Lie down and place a cool compress on your head.  Avoid headache triggers. If certain foods or odors seem to have triggered your migraines in the past, avoid them. A headache diary might help you identify triggers.  Include physical activity in your daily routine. Try a daily walk or other moderate aerobic exercise.  Manage stress. Find healthy ways to cope with the stressors, such as delegating tasks on your to-do list.  Practice relaxation techniques. Try deep breathing, yoga, massage and visualization.  Eat regularly. Eating regularly scheduled meals and maintaining a healthy diet might help prevent headaches. Also, drink plenty of fluids.  Follow a regular sleep schedule. Sleep deprivation might contribute to headaches Consider biofeedback. With this mind-body technique, you learn to control certain bodily functions -- such as muscle tension, heart rate and blood pressure -- to prevent headaches or reduce headache pain.    Proceed to emergency room if you experience new or worsening symptoms or symptoms do not resolve, if you have new neurologic symptoms or if headache is severe, or for any concerning symptom.   Provided education and documentation from American headache Society toolbox including articles on: chronic migraine medication overuse headache, chronic migraines, prevention of migraines, behavioral and other nonpharmacologic treatments for headache.    Cc: Epifanio Alm SQUIBB, MD  Nancy Epp, MD  [facility] [address] [place], [place] [address]  Phone [phone] Fax [phone]-

## 2023-10-16 NOTE — Telephone Encounter (Signed)
 Please schedule follow up with sarah slack in 6 months for med check can be video thank you

## 2023-10-16 NOTE — Telephone Encounter (Signed)
 Spoke with patient and scheduled VV with Lauraine for first available appointment which is 06/03/24 at 2pm

## 2023-10-23 ENCOUNTER — Other Ambulatory Visit (HOSPITAL_COMMUNITY): Payer: Self-pay

## 2023-10-23 ENCOUNTER — Other Ambulatory Visit: Payer: Self-pay | Admitting: *Deleted

## 2023-10-23 MED ORDER — AIMOVIG 140 MG/ML ~~LOC~~ SOAJ: 140.0000 mg | SUBCUTANEOUS | 11 refills | Status: DC

## 2023-10-23 NOTE — Telephone Encounter (Signed)
 Sent rx to pharmacy. (Along with DUR code message) if needed.

## 2023-10-23 NOTE — Telephone Encounter (Signed)
 Pharmacy Patient Advocate Encounter  Received notification from [facility] MEDICAID that Prior Authorization for Aimovig  140mg /ml Soaj has been APPROVED from 10/09/2023 to 10/08/2024   PA #/Case ID/Reference #: [id number]  Call Reference: [id number]  Pharmacy will need to DUR codes-if they have trouble here are the codes the plan provided in the reason box : TD (Therapeutic Duplication), M0 (that is a zero), and in the Outcome: 1G

## 2023-12-18 ENCOUNTER — Encounter: Payer: Self-pay | Admitting: Neurology

## 2023-12-19 ENCOUNTER — Ambulatory Visit: Admitting: Neurology

## 2023-12-19 VITALS — BP 112/72

## 2023-12-19 DIAGNOSIS — G43711 Chronic migraine without aura, intractable, with status migrainosus: Secondary | ICD-10-CM

## 2023-12-19 DIAGNOSIS — G43709 Chronic migraine without aura, not intractable, without status migrainosus: Secondary | ICD-10-CM | POA: Diagnosis not present

## 2023-12-19 DIAGNOSIS — R519 Headache, unspecified: Secondary | ICD-10-CM

## 2023-12-19 DIAGNOSIS — G43009 Migraine without aura, not intractable, without status migrainosus: Secondary | ICD-10-CM

## 2023-12-19 MED ORDER — ONABOTULINUMTOXINA 200 UNITS IJ SOLR
155.0000 [IU] | Freq: Once | INTRAMUSCULAR | Status: AC
  Administered 2023-12-19: 155 [IU] via INTRAMUSCULAR

## 2023-12-19 NOTE — Progress Notes (Signed)
   BOTOX  PROCEDURE NOTE FOR MIGRAINE HEADACHE  HISTORY: Nancy Porter is here for Botox .  Last injection was 09/19/23 with me.  Botox  works great for her.  It does wear off about 2 weeks before her next injection.  She has been under a lot of stress, her mother passed away.  Description of procedure:  The patient was placed in a sitting position. The standard protocol was used for Botox  as follows, with 5 units of Botox  injected at each site:   -Procerus muscle, midline injection  -Corrugator muscle, bilateral injection  -Frontalis muscle, bilateral injection, with 2 sites each side, medial injection was performed in the upper one third of the frontalis muscle, in the region vertical from the medial inferior edge of the superior orbital rim. The lateral injection was again in the upper one third of the forehead vertically above the lateral limbus of the cornea, 1.5 cm lateral to the medial injection site.  -Temporalis muscle injection, 4 sites, bilaterally. The first injection was 3 cm above the tragus of the ear, second injection site was 1.5 cm to 3 cm up from the first injection site in line with the tragus of the ear. The third injection site was 1.5-3 cm forward between the first 2 injection sites. The fourth injection site was 1.5 cm posterior to the second injection site.  -Occipitalis muscle injection, 3 sites, bilaterally. The first injection was done one half way between the occipital protuberance and the tip of the mastoid process behind the ear. The second injection site was done lateral and superior to the first, 1 fingerbreadth from the first injection. The third injection site was 1 fingerbreadth superiorly and medially from the first injection site.  -Cervical paraspinal muscle injection, 2 sites, bilateral, the first injection site was 1 cm from the midline of the cervical spine, 3 cm inferior to the lower border of the occipital protuberance. The second injection site was 1.5  cm superiorly and laterally to the first injection site.  -Trapezius muscle injection was performed at 3 sites, bilaterally. The first injection site was in the upper trapezius muscle halfway between the inflection point of the neck, and the acromion. The second injection site was one half way between the acromion and the first injection site. The third injection was done between the first injection site and the inflection point of the neck.  A 200 unit bottle of Botox  was used, 155 units were injected, the rest of the Botox  was wasted. The patient tolerated the procedure well, there were no complications of the above procedure.  Botox  NDC 9976-6078-97 Lot number [id number] Expiration date 01/14/2026 BB

## 2023-12-19 NOTE — Progress Notes (Signed)
 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

## 2024-02-25 ENCOUNTER — Other Ambulatory Visit (HOSPITAL_COMMUNITY): Payer: Self-pay

## 2024-02-25 ENCOUNTER — Telehealth: Payer: Self-pay

## 2024-02-25 MED ORDER — AIMOVIG 140 MG/ML ~~LOC~~ SOAJ: 140.0000 mg | SUBCUTANEOUS | 11 refills | Status: AC

## 2024-02-25 NOTE — Telephone Encounter (Signed)
 PT now has Medicare and needs PA for Aimovig -can a provider send in a new rx or will PT need to be seen for a visit? Please advise-Thanks!

## 2024-02-27 NOTE — Telephone Encounter (Signed)
 Pharmacy Patient Advocate Encounter   Received notification from CoverMyMeds that prior authorization for Aimovig  is required/requested.   Insurance verification completed.   The patient is insured through [facility].   Per test claim: PA required; PA submitted to above mentioned insurance via Latent Key/confirmation #/EOC [id number] Status is pending

## 2024-02-28 ENCOUNTER — Other Ambulatory Visit (HOSPITAL_COMMUNITY): Payer: Self-pay

## 2024-02-28 NOTE — Telephone Encounter (Signed)
 Pharmacy Patient Advocate Encounter  Received notification from [facility] that Prior Authorization for Aimovig  has been APPROVED from 02/27/2024 to 02/26/2025. Unable to obtain price due to refill too soon rejection, last fill date 02/17/2024 next available fill date11/26/2025   PA #/Case ID/Reference #: [id number]

## 2024-03-24 ENCOUNTER — Encounter: Payer: Self-pay | Admitting: Neurology

## 2024-03-25 ENCOUNTER — Ambulatory Visit: Admitting: Neurology

## 2024-03-25 ENCOUNTER — Telehealth: Payer: Self-pay | Admitting: Neurology

## 2024-03-25 NOTE — Telephone Encounter (Signed)
 Pt called to reschedule appt today dut to being sick . Pt also would like sooner appt if possible   Appt rescheduled Pt is on wait list

## 2024-03-30 ENCOUNTER — Telehealth: Payer: Self-pay

## 2024-03-30 ENCOUNTER — Other Ambulatory Visit (HOSPITAL_COMMUNITY): Payer: Self-pay

## 2024-03-30 NOTE — Telephone Encounter (Signed)
 Pharmacy Patient Advocate Encounter  Received notification from [facility] that Prior Authorization for Ubrelvy  has been APPROVED from 03/30/2024 to 03/30/2025. Unable to obtain price due to refill too soon rejection, last fill date 03/30/2024 next available fill date1/11/2024   PA #/Case ID/Reference #: [id number]   LAST FILL DT [id number] FILLED AT [facility]ARM[id number],PHONE #[phone]

## 2024-03-30 NOTE — Telephone Encounter (Signed)
 Pharmacy Patient Advocate Encounter   Received notification from CoverMyMeds that prior authorization for Ubrelvy  is required/requested.   Insurance verification completed.   The patient is insured through [facility].   Per test claim: PA required; PA submitted to above mentioned insurance via Latent Key/confirmation #/EOC [facility] Status is pending

## 2024-04-20 ENCOUNTER — Encounter: Payer: Self-pay | Admitting: Neurology

## 2024-04-21 ENCOUNTER — Ambulatory Visit (INDEPENDENT_AMBULATORY_CARE_PROVIDER_SITE_OTHER): Admitting: Neurology

## 2024-04-21 VITALS — BP 100/65

## 2024-04-21 DIAGNOSIS — G43709 Chronic migraine without aura, not intractable, without status migrainosus: Secondary | ICD-10-CM | POA: Diagnosis not present

## 2024-04-21 DIAGNOSIS — G43009 Migraine without aura, not intractable, without status migrainosus: Secondary | ICD-10-CM

## 2024-04-21 MED ORDER — ONABOTULINUMTOXINA 200 UNITS IJ SOLR
155.0000 [IU] | Freq: Once | INTRAMUSCULAR | Status: AC
  Administered 2024-04-21: 165 [IU] via INTRAMUSCULAR

## 2024-04-21 NOTE — Progress Notes (Signed)
 Botox - 200 units x 1 vial Lot: [id number] Expiration: 06/2026 NDC: 9976-6078-97  Bacteriostatic 0.9% Sodium Chloride - 4 mL  Lot: [id number] Expiration: MAR-31-2027 NDC: 9590-8033-97  Dx: G43.009 B/B Witnessed by: Buddy Skeeters, CMA

## 2024-04-21 NOTE — Progress Notes (Signed)
" ° °  BOTOX  PROCEDURE NOTE FOR MIGRAINE HEADACHE   HISTORY: Nancy Porter is here for Botox . Last was 12/19/23 with me. She is overdue for Botox . Has been having more migraines. Remains on Aimovig  and Ubrelvy .  Description of procedure:  The patient was placed in a sitting position. The standard protocol was used for Botox  as follows, with 5 units of Botox  injected at each site:   -Procerus muscle, midline injection  -Corrugator muscle, bilateral injection  -Frontalis muscle, bilateral injection, with 2 sites each side, medial injection was performed in the upper one third of the frontalis muscle, in the region vertical from the medial inferior edge of the superior orbital rim. The lateral injection was again in the upper one third of the forehead vertically above the lateral limbus of the cornea, 1.5 cm lateral to the medial injection site.  -Temporalis muscle injection, 4 sites, bilaterally. The first injection was 3 cm above the tragus of the ear, second injection site was 1.5 cm to 3 cm up from the first injection site in line with the tragus of the ear. The third injection site was 1.5-3 cm forward between the first 2 injection sites. The fourth injection site was 1.5 cm posterior to the second injection site.  -Occipitalis muscle injection, 3 sites, bilaterally. The first injection was done one half way between the occipital protuberance and the tip of the mastoid process behind the ear. The second injection site was done lateral and superior to the first, 1 fingerbreadth from the first injection. The third injection site was 1 fingerbreadth superiorly and medially from the first injection site.  -Cervical paraspinal muscle injection, 2 sites, bilateral, the first injection site was 1 cm from the midline of the cervical spine, 3 cm inferior to the lower border of the occipital protuberance. The second injection site was 1.5 cm superiorly and laterally to the first injection  site.  -Trapezius muscle injection was performed at 3 sites, bilaterally. The first injection site was in the upper trapezius muscle halfway between the inflection point of the neck, and the acromion. The second injection site was one half way between the acromion and the first injection site. The third injection was done between the first injection site and the inflection point of the neck.  5 units bilateral masseters.   A 200 unit bottle of Botox  was used, 165 units were injected, the rest of the Botox  was wasted. The patient tolerated the procedure well, there were no complications of the above procedure.  Botox  NDC 9976-6078-97 Lot number [id number] Expiration date 06/2026 BB  Will get her established with another neurologist here since Dr. Ines is not longer here. We did do masseters today, as has been her long standing treatment and offers significant benefit.    "

## 2024-05-20 ENCOUNTER — Other Ambulatory Visit (HOSPITAL_COMMUNITY): Payer: Self-pay

## 2024-05-20 ENCOUNTER — Telehealth: Payer: Self-pay

## 2024-05-20 ENCOUNTER — Telehealth: Payer: Self-pay | Admitting: Neurology

## 2024-05-20 NOTE — Telephone Encounter (Signed)
 I received notification that Aimovig  requires prior authorization.

## 2024-05-20 NOTE — Telephone Encounter (Signed)
 Pharmacy Patient Advocate Encounter   Received notification from Pt Calls Messages that prior authorization for Aimovig  140MG /ML auto-injectors is required/requested.   Insurance verification completed.   The patient is insured through [facility].   Per test claim: PA required; PA submitted to above mentioned insurance via Latent Key/confirmation #/EOC [id number] Status is pending

## 2024-05-20 NOTE — Telephone Encounter (Signed)
 PA request has been Submitted. New Encounter has been or will be created for follow up. For additional info see Pharmacy Prior Auth telephone encounter from 05-20-2024.

## 2024-06-03 ENCOUNTER — Telehealth: Admitting: Neurology

## 2024-07-15 ENCOUNTER — Ambulatory Visit: Admitting: Neurology
